# Patient Record
Sex: Female | Born: 1950
Health system: Southern US, Community
[De-identification: ages and names within clinical notes are randomized; demographics above are authoritative.]

## PROBLEM LIST (undated history)

## (undated) DIAGNOSIS — R002 Palpitations: Secondary | ICD-10-CM

## (undated) DIAGNOSIS — Z860101 Personal history of adenomatous and serrated colon polyps: Secondary | ICD-10-CM

## (undated) DIAGNOSIS — I4891 Unspecified atrial fibrillation: Secondary | ICD-10-CM

## (undated) DIAGNOSIS — I4510 Unspecified right bundle-branch block: Secondary | ICD-10-CM

## (undated) DIAGNOSIS — K219 Gastro-esophageal reflux disease without esophagitis: Secondary | ICD-10-CM

## (undated) DIAGNOSIS — R569 Unspecified convulsions: Secondary | ICD-10-CM

## (undated) DIAGNOSIS — R519 Headache, unspecified: Secondary | ICD-10-CM

## (undated) DIAGNOSIS — Z800 Family history of malignant neoplasm of digestive organs: Secondary | ICD-10-CM

## (undated) DIAGNOSIS — R51 Headache: Secondary | ICD-10-CM

## (undated) DIAGNOSIS — H269 Unspecified cataract: Secondary | ICD-10-CM

## (undated) DIAGNOSIS — Z86010 Personal history of colonic polyps: Secondary | ICD-10-CM

## (undated) DIAGNOSIS — G8929 Other chronic pain: Secondary | ICD-10-CM

## (undated) DIAGNOSIS — I10 Essential (primary) hypertension: Secondary | ICD-10-CM

## (undated) DIAGNOSIS — T7840XA Allergy, unspecified, initial encounter: Secondary | ICD-10-CM

## (undated) DIAGNOSIS — Z87898 Personal history of other specified conditions: Secondary | ICD-10-CM

## (undated) DIAGNOSIS — F1910 Other psychoactive substance abuse, uncomplicated: Secondary | ICD-10-CM

## (undated) DIAGNOSIS — C3490 Malignant neoplasm of unspecified part of unspecified bronchus or lung: Secondary | ICD-10-CM

## (undated) DIAGNOSIS — M1990 Unspecified osteoarthritis, unspecified site: Secondary | ICD-10-CM

## (undated) DIAGNOSIS — E079 Disorder of thyroid, unspecified: Secondary | ICD-10-CM

## (undated) DIAGNOSIS — C4490 Unspecified malignant neoplasm of skin, unspecified: Secondary | ICD-10-CM

## (undated) DIAGNOSIS — G709 Myoneural disorder, unspecified: Secondary | ICD-10-CM

## (undated) DIAGNOSIS — E785 Hyperlipidemia, unspecified: Secondary | ICD-10-CM

## (undated) DIAGNOSIS — J439 Emphysema, unspecified: Secondary | ICD-10-CM

## (undated) HISTORY — DX: Personal history of adenomatous and serrated colon polyps: Z86.0101

## (undated) HISTORY — DX: Personal history of colonic polyps: Z86.010

## (undated) HISTORY — DX: Gastro-esophageal reflux disease without esophagitis: K21.9

## (undated) HISTORY — DX: Hyperlipidemia, unspecified: E78.5

## (undated) HISTORY — DX: Other psychoactive substance abuse, uncomplicated: F19.10

## (undated) HISTORY — DX: Unspecified cataract: H26.9

## (undated) HISTORY — DX: Personal history of other specified conditions: Z87.898

## (undated) HISTORY — DX: Disorder of thyroid, unspecified: E07.9

## (undated) HISTORY — DX: Headache, unspecified: R51.9

## (undated) HISTORY — DX: Myoneural disorder, unspecified: G70.9

## (undated) HISTORY — PX: EYE SURGERY: SHX253

## (undated) HISTORY — DX: Unspecified malignant neoplasm of skin, unspecified: C44.90

## (undated) HISTORY — PX: WRIST SURGERY: SHX841

## (undated) HISTORY — DX: Palpitations: R00.2

## (undated) HISTORY — PX: OTHER SURGICAL HISTORY: SHX169

## (undated) HISTORY — DX: Unspecified right bundle-branch block: I45.10

## (undated) HISTORY — DX: Family history of malignant neoplasm of digestive organs: Z80.0

## (undated) HISTORY — PX: SMALL INTESTINE SURGERY: SHX150

## (undated) HISTORY — PX: CATARACT EXTRACTION W/ INTRAOCULAR LENS  IMPLANT, BILATERAL: SHX1307

## (undated) HISTORY — DX: Headache: R51

## (undated) HISTORY — DX: Other chronic pain: G89.29

## (undated) HISTORY — DX: Essential (primary) hypertension: I10

## (undated) HISTORY — DX: Allergy, unspecified, initial encounter: T78.40XA

---

## 1983-06-25 HISTORY — PX: BLADDER SURGERY: SHX569

## 1998-05-10 ENCOUNTER — Other Ambulatory Visit: Admission: RE | Admit: 1998-05-10 | Discharge: 1998-05-10 | Payer: Self-pay | Admitting: Gynecology

## 1999-06-06 ENCOUNTER — Other Ambulatory Visit: Admission: RE | Admit: 1999-06-06 | Discharge: 1999-06-06 | Payer: Self-pay | Admitting: Gynecology

## 1999-06-25 HISTORY — PX: BREAST BIOPSY: SHX20

## 2000-03-31 ENCOUNTER — Other Ambulatory Visit: Admission: RE | Admit: 2000-03-31 | Discharge: 2000-03-31 | Payer: Self-pay | Admitting: Radiology

## 2000-08-13 ENCOUNTER — Other Ambulatory Visit: Admission: RE | Admit: 2000-08-13 | Discharge: 2000-08-13 | Payer: Self-pay | Admitting: Gynecology

## 2000-12-01 ENCOUNTER — Other Ambulatory Visit: Admission: RE | Admit: 2000-12-01 | Discharge: 2000-12-01 | Payer: Self-pay | Admitting: Gynecology

## 2001-05-04 ENCOUNTER — Other Ambulatory Visit: Admission: RE | Admit: 2001-05-04 | Discharge: 2001-05-04 | Payer: Self-pay | Admitting: Gynecology

## 2011-12-10 DIAGNOSIS — H5210 Myopia, unspecified eye: Secondary | ICD-10-CM | POA: Insufficient documentation

## 2011-12-25 DIAGNOSIS — H2510 Age-related nuclear cataract, unspecified eye: Secondary | ICD-10-CM | POA: Insufficient documentation

## 2012-01-24 ENCOUNTER — Telehealth: Payer: Self-pay | Admitting: Internal Medicine

## 2012-01-24 NOTE — Telephone Encounter (Signed)
Georgana Romain has questions about Fran Neiswonger care.  Patient is at [facility].  Non urgent.  Please call back if caller is POA or has been released to share info with at [phone] or [phone]

## 2012-01-27 NOTE — Telephone Encounter (Signed)
Left message on cell phone that we don't have pt's records here, she would need to call [facility] to leave a message for Dr.Letvak there.

## 2013-12-31 LAB — HM COLONOSCOPY

## 2015-06-29 LAB — HM DEXA SCAN

## 2015-07-11 DIAGNOSIS — I4510 Unspecified right bundle-branch block: Secondary | ICD-10-CM | POA: Insufficient documentation

## 2016-02-20 LAB — HM HEPATITIS C SCREENING LAB: HM Hepatitis Screen: NEGATIVE

## 2016-07-23 LAB — HM MAMMOGRAPHY

## 2017-03-25 DIAGNOSIS — R002 Palpitations: Secondary | ICD-10-CM | POA: Insufficient documentation

## 2017-07-25 LAB — HM MAMMOGRAPHY

## 2017-07-28 DIAGNOSIS — M47812 Spondylosis without myelopathy or radiculopathy, cervical region: Secondary | ICD-10-CM | POA: Insufficient documentation

## 2017-08-06 DIAGNOSIS — R911 Solitary pulmonary nodule: Secondary | ICD-10-CM | POA: Insufficient documentation

## 2017-08-06 DIAGNOSIS — C3430 Malignant neoplasm of lower lobe, unspecified bronchus or lung: Secondary | ICD-10-CM | POA: Insufficient documentation

## 2017-08-06 DIAGNOSIS — Z85118 Personal history of other malignant neoplasm of bronchus and lung: Secondary | ICD-10-CM | POA: Insufficient documentation

## 2017-08-11 DIAGNOSIS — R51 Headache: Secondary | ICD-10-CM

## 2017-08-11 DIAGNOSIS — G4486 Cervicogenic headache: Secondary | ICD-10-CM | POA: Insufficient documentation

## 2017-08-11 DIAGNOSIS — M5030 Other cervical disc degeneration, unspecified cervical region: Secondary | ICD-10-CM | POA: Insufficient documentation

## 2017-08-12 DIAGNOSIS — Z961 Presence of intraocular lens: Secondary | ICD-10-CM | POA: Insufficient documentation

## 2017-08-12 DIAGNOSIS — H04129 Dry eye syndrome of unspecified lacrimal gland: Secondary | ICD-10-CM | POA: Insufficient documentation

## 2017-08-12 DIAGNOSIS — H43819 Vitreous degeneration, unspecified eye: Secondary | ICD-10-CM | POA: Insufficient documentation

## 2017-08-19 ENCOUNTER — Encounter: Payer: Self-pay | Admitting: Pulmonary Disease

## 2017-08-19 ENCOUNTER — Ambulatory Visit: Payer: Medicare Other | Admitting: Pulmonary Disease

## 2017-08-19 VITALS — BP 126/82 | HR 90 | Ht 67.0 in | Wt 143.0 lb

## 2017-08-19 DIAGNOSIS — R911 Solitary pulmonary nodule: Secondary | ICD-10-CM

## 2017-08-19 DIAGNOSIS — R918 Other nonspecific abnormal finding of lung field: Secondary | ICD-10-CM | POA: Diagnosis not present

## 2017-08-19 NOTE — Patient Instructions (Addendum)
Lung nodule: Given your smoking history we need to image this again We will perform a PET/CT scan If the findings are suggestive of malignancy then we will recommend a biopsy If the findings do not show evidence of increased metabolic activity then we will monitor with repeat CT scans We will arrange a follow-up appointment for 3 months from now but may see you sooner if necessary depending on the results of the PET/CT

## 2017-08-19 NOTE — Progress Notes (Signed)
Subjective:   PATIENT ID: Margaret Charles GENDER: female DOB: 1951/02/02, MRN: [id number]  Synopsis: Referred in February 2019 for a lung nodule. She is a former smoker who smoked about 1 ppd for 40 and quit in 2010.   HPI  Chief Complaint  Patient presents with  . pulmonary consult    lung nodule from CT scan   She is a pleasant former smoker who was referred to our clinic for evaluation of an abnormal lung cancer screenig CT scan.  She denies dyspnea, or respiratory complaints.  Sh ehas a lot of "post nasal" mucus in the back of her throat that bothers her some.  She says that she will have some cough associated with this.  She exercises regularly with walking and doing water aerobics.  She can walk three miles and she doesn't have to stop and rest.  She has no personal history of malignancy.  Her sister had breast cancer diagnosed a year ago.    She has a history of bladder reconstruction using her ileus years ago.  This lead to a small bowel obstruction a few times, but none in 30 years.    She used to be an alcoholic, quit drinking 38 years ago.  She struggled with her weight many years ago and has had some seizures "when she was losing the weight".  She denies seizures in years though at this point.    Past Medical History:  Diagnosis Date  . Chronic headaches   . Palpitations   . RBBB (right bundle branch block)      Family History  Problem Relation Age of Onset  . Dementia Mother   . Prostate cancer Father   . Breast cancer Sister      Social History   Socioeconomic History  . Marital status: Single    Spouse name: Not on file  . Number of children: Not on file  . Years of education: Not on file  . Highest education level: Not on file  Social Needs  . Financial resource strain: Not on file  . Food insecurity - worry: Not on file  . Food insecurity - inability: Not on file  . Transportation needs - medical: Not on file  . Transportation needs - non-medical:  Not on file  Occupational History  . Not on file  Tobacco Use  . Smoking status: Former Smoker    Packs/day: 1.00    Years: 40.00    Pack years: 40.00    Types: Cigarettes    Last attempt to quit: 12/03/2008    Years since quitting: 8.7  . Smokeless tobacco: Never Used  Substance and Sexual Activity  . Alcohol use: Not on file  . Drug use: Not on file  . Sexual activity: Not on file  Other Topics Concern  . Not on file  Social History Narrative  . Not on file     Allergies  Allergen Reactions  . Codeine Nausea And Vomiting     Outpatient Medications Prior to Visit  Medication Sig Dispense Refill  . IBUPROFEN PO Take 600 mg by mouth daily as needed.    . Multiple Vitamins-Minerals (CENTRUM ADULTS PO) Take 1 tablet by mouth daily.    Marland Kitchen OVER THE COUNTER MEDICATION Chamomile 100mg  vegetarian tablet- 1 tab daily.     No facility-administered medications prior to visit.     Review of Systems  Constitutional: Negative for chills, fever, malaise/fatigue and weight loss.  HENT: Negative for congestion, nosebleeds, sinus  pain and sore throat.   Eyes: Negative for photophobia, pain and discharge.  Respiratory: Negative for cough, hemoptysis, sputum production, shortness of breath and wheezing.   Cardiovascular: Negative for chest pain, palpitations, orthopnea and leg swelling.  Gastrointestinal: Negative for abdominal pain, constipation, diarrhea, nausea and vomiting.  Genitourinary: Negative for dysuria, frequency, hematuria and urgency.  Musculoskeletal: Negative for back pain, joint pain, myalgias and neck pain.  Skin: Negative for itching and rash.  Neurological: Negative for tingling, tremors, sensory change, speech change, focal weakness, seizures, weakness and headaches.  Psychiatric/Behavioral: Negative for memory loss, substance abuse and suicidal ideas. The patient is not nervous/anxious.       Objective:  Physical Exam   Vitals:   08/19/17 1451  BP: 126/82    Pulse: 90  SpO2: 98%  Weight: 143 lb (64.9 kg)  Height: 5\' 7"  (1.702 m)    Gen: well appearing, no acute distress HENT: NCAT, OP clear, neck supple without masses Eyes: PERRL, EOMi Lymph: no cervical lymphadenopathy PULM: CTA B CV: RRR, no mgr, no JVD GI: BS+, soft, nontender, no hsm Derm: no rash or skin breakdown MSK: normal bulk and tone Neuro: A&Ox4, CN II-XII intact, strength 5/5 in all 4 extremities Psyche: normal mood and affect   CBC No results found for: WBC, RBC, HGB, HCT, PLT, MCV, MCH, MCHC, RDW, LYMPHSABS, MONOABS, EOSABS, BASOSABS   Chest imaging: February 2019 CT chest images independently reviewed showing 2 nodules, one small nodule approximately 4 mm in size in the right upper lobe, one larger nodule in the right lower lobe approximately 14 mm in size  PFT:  Labs:  Path:  Echo:  Heart Catheterization:  Records from her visit with her primary care physician in February 2019 reviewed where she was referred for lung cancer screening.       Assessment & Plan:   Nodule of lower lobe of right lung  Discussion: Ms. Koone presents today for evaluation of an abnormal CT scan of her chest which was performed as part of a lung cancer screening program.  She has a 1.4 cm nodule in the right lower lobe.  Given her smoking history this is worrisome.  We talked about different diagnostic options and we think the best approach moving forward is to perform a PET/CT.  Plan: Lung nodule: Given your smoking history we need to image this again We will perform a PET/CT scan If the findings are suggestive of malignancy then we will recommend a biopsy If the findings do not show evidence of increased metabolic activity then we will monitor with repeat CT scans We will arrange a follow-up appointment for 3 months from now but may see you sooner if necessary depending on the results of the PET/CT    Current Outpatient Medications:  .  IBUPROFEN PO, Take 600 mg by  mouth daily as needed., Disp: , Rfl:  .  Multiple Vitamins-Minerals (CENTRUM ADULTS PO), Take 1 tablet by mouth daily., Disp: , Rfl:  .  OVER THE COUNTER MEDICATION, Chamomile 100mg  vegetarian tablet- 1 tab daily., Disp: , Rfl:

## 2017-08-22 HISTORY — PX: OTHER SURGICAL HISTORY: SHX169

## 2017-08-25 ENCOUNTER — Encounter (HOSPITAL_COMMUNITY): Payer: Self-pay | Admitting: Radiology

## 2017-08-25 ENCOUNTER — Encounter (HOSPITAL_COMMUNITY)
Admission: RE | Admit: 2017-08-25 | Discharge: 2017-08-25 | Disposition: A | Discharge status: Home / Self Care | Payer: Medicare Other | Source: Ambulatory Visit | Attending: Pulmonary Disease | Admitting: Pulmonary Disease

## 2017-08-25 DIAGNOSIS — R918 Other nonspecific abnormal finding of lung field: Secondary | ICD-10-CM | POA: Diagnosis present

## 2017-08-25 LAB — GLUCOSE, CAPILLARY: Glucose-Capillary: 92 mg/dL (ref 65–99)

## 2017-08-25 MED ORDER — FLUDEOXYGLUCOSE F - 18 (FDG) INJECTION
7.1000 | Freq: Once | INTRAVENOUS | Status: AC | PRN
  Administered 2017-08-25: 7.1 via INTRAVENOUS

## 2017-08-26 ENCOUNTER — Telehealth: Payer: Self-pay | Admitting: Pulmonary Disease

## 2017-08-26 NOTE — Telephone Encounter (Signed)
MR, due to increasing chances that it is cancer, I would feel more comfortable with you contacting the patient incase she were to have multiple questions or concerns. Please advise on this.

## 2017-08-26 NOTE — Telephone Encounter (Signed)
Gave results - d/w patient . Give her appt to see me on thu. Add on ok. Do pft before she sees me. Let me know

## 2017-08-26 NOTE — Telephone Encounter (Signed)
BQ not back until 3/11, can the DOD read the Pet scan?

## 2017-08-26 NOTE — Telephone Encounter (Signed)
The RLL nodule is PET hot - increaseing chances it is cancer There is a lso a PET hot thyroid nodule - needs biopsy  Please advise  - that in Dr Lake Bells absence I am hapy to see the patient and counsel her about this. I can ssee her this week  - thurs 08/28/17 is lightest for me  - see if you can get a PFT on or before she sees me  Dr. Brand Males, M.D., [facility].C.P Pulmonary and Critical Care Medicine Staff Physician, [place] Director - Interstitial Lung Disease  Program  Pulmonary [place] at [place], [place], [address]  Pager: [phone], If no answer or between  15:00h - 7:00h: call [phone] Telephone: [phone]

## 2017-08-27 NOTE — Telephone Encounter (Signed)
Please work her in for Friday morning. IS this possible? Please check with Raquel Sarna. I can see her  At 8.50am  Dr. Brand Males, M.D., [facility].C.P Pulmonary and Critical Care Medicine Staff Physician, [place] Director - Interstitial Lung Disease  Program  Pulmonary [place] at [place], [place], [address]  Pager: [phone], If no answer or between  15:00h - 7:00h: call [phone] Telephone: [phone]

## 2017-08-27 NOTE — Telephone Encounter (Signed)
Sending to [place] per her request

## 2017-08-27 NOTE — Telephone Encounter (Signed)
Dr. Chase Caller, we are unable to put the patient on RB schedule due to it already being booked. We can work the patient in with you Thursday 3.7.19 with a PFT at 3:00 if you would be willing to see the patient at 4:00. Please advise if you are ok with this so we can look at other options if you are unable to do this. Thanks.

## 2017-08-27 NOTE — Telephone Encounter (Signed)
Called pt to discuss per MR pt to have PFT done followed by OV with him.  Pt scheduled for pft Thurs at 4pm followed by OV with MR at 945.  Pt expressed understanding with all stated. Nothing further needed at this current time.

## 2017-08-28 ENCOUNTER — Other Ambulatory Visit: Payer: Self-pay | Admitting: Internal Medicine

## 2017-08-28 ENCOUNTER — Ambulatory Visit (INDEPENDENT_AMBULATORY_CARE_PROVIDER_SITE_OTHER): Payer: Medicare Other | Admitting: Internal Medicine

## 2017-08-28 DIAGNOSIS — R0602 Shortness of breath: Secondary | ICD-10-CM | POA: Diagnosis not present

## 2017-08-28 LAB — PULMONARY FUNCTION TEST
DL/VA % PRED: 88 %
DL/VA: 4.44 ml/min/mmHg/L
DLCO UNC: 17.97 ml/min/mmHg
DLCO unc % pred: 66 %
FEF 25-75 POST: 1.77 L/s
FEF 25-75 Pre: 1.49 L/sec
FEF2575-%CHANGE-POST: 18 %
FEF2575-%PRED-POST: 83 %
FEF2575-%PRED-PRE: 70 %
FEV1-%CHANGE-POST: 4 %
FEV1-%PRED-PRE: 78 %
FEV1-%Pred-Post: 81 %
FEV1-POST: 2.06 L
FEV1-Pre: 1.98 L
FEV1FVC-%CHANGE-POST: 3 %
FEV1FVC-%PRED-PRE: 99 %
FEV6-%Change-Post: 1 %
FEV6-%Pred-Post: 82 %
FEV6-%Pred-Pre: 81 %
FEV6-POST: 2.63 L
FEV6-Pre: 2.6 L
FEV6FVC-%Change-Post: 0 %
FEV6FVC-%PRED-POST: 104 %
FEV6FVC-%Pred-Pre: 103 %
FVC-%Change-Post: 0 %
FVC-%PRED-PRE: 78 %
FVC-%Pred-Post: 79 %
FVC-POST: 2.63 L
FVC-PRE: 2.61 L
POST FEV1/FVC RATIO: 78 %
PRE FEV1/FVC RATIO: 76 %
PRE FEV6/FVC RATIO: 99 %
Post FEV6/FVC ratio: 100 %

## 2017-08-28 NOTE — Progress Notes (Signed)
PFT done today. 

## 2017-08-29 ENCOUNTER — Encounter: Payer: Self-pay | Admitting: Internal Medicine

## 2017-08-29 ENCOUNTER — Ambulatory Visit: Payer: Medicare Other | Admitting: Internal Medicine

## 2017-08-29 ENCOUNTER — Telehealth: Payer: Self-pay | Admitting: Internal Medicine

## 2017-08-29 VITALS — BP 140/84 | HR 84 | Ht 66.0 in | Wt 147.6 lb

## 2017-08-29 DIAGNOSIS — R911 Solitary pulmonary nodule: Secondary | ICD-10-CM

## 2017-08-29 DIAGNOSIS — E041 Nontoxic single thyroid nodule: Secondary | ICD-10-CM | POA: Diagnosis not present

## 2017-08-29 NOTE — Telephone Encounter (Signed)
   Margaret Charles  Your patient. IWE had to work her in after she was anxious about PET result. The RLL 1.3cm nodule is PET HOT and she has good lung function. Do you want to do biopsy? Or refer her to surgery ? I thought latter but have told her I would need to d/w you  Thanks  Dr. Brand Males, M.D., [facility].C.P Pulmonary and Critical Care Medicine Staff Physician, [place] Director - Interstitial Lung Disease  Program  Pulmonary [place] at [place], [place], [address]  Pager: [phone], If no answer or between  15:00h - 7:00h: call [phone] Telephone: [phone]

## 2017-08-29 NOTE — Patient Instructions (Addendum)
Nodule of lower lobe of right lung - will d/w Dr Lake Bells but I assume that based on good lung function he will likely recommend direct surgical approach with referral to thoracic surgeohn - please take copy of consent form for the [employer] nodule study; someone should call you in the next week or two  Left thyroid nodule - refer endocrinology   Followup  - if no call from our office by 09/04/17 please calls

## 2017-08-29 NOTE — Progress Notes (Signed)
Subjective:     Patient ID: Margaret Charles, female   DOB: 04/25/1951, 67 y.o.   MRN: [id number]  HPI  Synopsis: Referred in February 2019 for a lung nodule. She is a former smoker who smoked about 1 ppd for 40 and quit in 2010.   HPI  Chief Complaint  Patient presents with  . pulmonary consult    lung nodule from CT scan   She is a pleasant former smoker who was referred to our clinic for evaluation of an abnormal lung cancer screenig CT scan.  She denies dyspnea, or respiratory complaints.  Sh ehas a lot of "post nasal" mucus in the back of her throat that bothers her some.  She says that she will have some cough associated with this.  She exercises regularly with walking and doing water aerobics.  She can walk three miles and she doesn't have to stop and rest.  She has no personal history of malignancy.  Her sister had breast cancer diagnosed a year ago.    She has a history of bladder reconstruction using her ileus years ago.  This lead to a small bowel obstruction a few times, but none in 30 years.    She used to be an alcoholic, quit drinking 38 years ago.  She struggled with her weight many years ago and has had some seizures "when she was losing the weight".  She denies seizures in years though at this point.     OV 08/29/2017  Chief Complaint  Patient presents with  . Follow-up    PET scan results, but no symptoms   PCP Margaret Rima, MD   Margaret Charles -is here to discuss her PET scan results which shows right lower lobe PET hot lung nodule..  She had this on August 25, 2017.  Margaret Charles is away on vacation.  And therefore I was asked to give the results to her.  Patient became very anxious on the phone hearing all the results.  Therefore we brought her in to discuss the results.  In the interim we also did pulmonary function test and her FEV1 is greater than 2 L and her DLCO is greater than 60% and she did an excellent walk test in the office today.  She does not have any  respiratory symptoms.  Of note the PET scan also showed a left upper lobe thyroid nodule that is PET hot.  She grew up in the [place] still she was a teenager.  Walking desaturation test on 08/29/2017 185 feet x 3 laps on ROOM AIR:  did walk all 3 laps at good pace and did not desaturate. Rest pulse ox was 100%, final pulse ox was 100%. HR response 85/min at rest to 118/min at peak exertion. Patient Margaret Charles  Did not Desaturate < 88% . Margaret Charles did not  Desaturated </= 3% points. Margaret Charles yes did get tachyardic    Results for Margaret, Charles (MRN [id number]) as of 08/29/2017 10:00  Ref. Range 08/28/2017 15:37  FEV1-Post Latest Units: L 2.06  FEV1-%Pred-Post Latest Units: % 81  FEV1-%Change-Post Latest Units: % 4  Post FEV1/FVC ratio Latest Units: % 78  Results for Margaret, Charles (MRN [id number]) as of 08/29/2017 10:00  Ref. Range 08/28/2017 15:37  DLCO unc Latest Units: ml/min/mmHg 17.97  DLCO unc % pred Latest Units: % 66     has a past medical history of Chronic headaches, Palpitations, and RBBB (right bundle branch block).   reports that she quit smoking about  8 years ago. Her smoking use included cigarettes. She has a 40.00 pack-year smoking history. she has never used smokeless tobacco.  Past Surgical History:  Procedure Laterality Date  . BLADDER SURGERY    . obstructions     x2 bowel obstruction surgeries    Allergies  Allergen Reactions  . Codeine Nausea And Vomiting    Immunization History  Administered Date(s) Administered  . Influenza Whole 03/19/2017  . Pneumococcal Conjugate-13 06/15/2015  . Pneumococcal Polysaccharide-23 03/19/2017    Family History  Problem Relation Age of Onset  . Dementia Mother   . Prostate cancer Father   . Breast cancer Sister      Current Outpatient Medications:  .  Pseudoeph-Doxylamine-DM-APAP (DAYQUIL/NYQUIL COLD/FLU RELIEF PO), Take 1 tablet by mouth 2 (two) times daily. Takes dayquil in the morning, and nightquil at  night one pill, Disp: , Rfl:  .  IBUPROFEN PO, Take 600 mg by mouth daily as needed., Disp: , Rfl:  .  Multiple Vitamins-Minerals (CENTRUM ADULTS PO), Take 1 tablet by mouth daily., Disp: , Rfl:  .  OVER THE COUNTER MEDICATION, Chamomile 100mg  vegetarian tablet- 1 tab daily., Disp: , Rfl:    Review of Systems     Objective:   Physical Exam Vitals:   08/29/17 0950  BP: 140/84  Pulse: 84  SpO2: 99%  Weight: 147 lb 9.6 oz (67 kg)  Height: 5\' 6"  (1.676 m)  Discussion only visit     Assessment:       ICD-10-CM   1. Nodule of lower lobe of right lung R91.1   2. Left thyroid nodule E04.1        Plan:     .  Right lower lobe lung nodule: She is a former smoker age greater than 74, nodule picked up on lung cancer screening program and is PET active.  There is a high pretest probability for non-small cell lung cancer stage I.  However she grew up in [place] through her teen years and therefore there is a small possibility that this could be histoplasma.  We discussed these 2 possibilities.  I suspect she will need direct surgical referral with wedge resection converting to lobectomy given her good lung function.  However I have told her that I need to discuss this with Margaret Charles her primary pulmonologist to see if he is in agreement with the plan now has different ideas to the approach.Also, we briefly discussed [employer] nodule study blood draw; she is interested in it and so gave copy of ICF for her to review  Left thyroid nodule: We will make a referral to the endocrinologist.  I told her that based on my experience that this is unlikely to be related to the lung nodule.   Dr. Brand Males, M.D., [facility].C.P Pulmonary and Critical Care Medicine Staff Physician, [place] Director - Interstitial Lung Disease  Program  Pulmonary [place] at [place], [place], [address]  Pager: [phone], If no answer or between   15:00h - 7:00h: call [phone] Telephone: [phone]

## 2017-09-01 ENCOUNTER — Telehealth: Payer: Self-pay | Admitting: *Deleted

## 2017-09-01 NOTE — Telephone Encounter (Signed)
Oncology Nurse Navigator Documentation  Oncology Nurse Navigator Flowsheets 09/01/2017  Navigator Location [facility]  Referral date to RadOnc/MedOnc 09/01/2017  Navigator Encounter Type Telephone/I received referral on Margaret Charles today. I called her to schedule her to be seen this week. I was unable to reach. I did leave vm message with my name and phone number to call.   Telephone Outgoing Call  Treatment Phase Abnormal Scans  Barriers/Navigation Needs Coordination of Care  Interventions Coordination of Care  Coordination of Care Other  Acuity Level 2  Time Spent with Patient 30

## 2017-09-01 NOTE — Telephone Encounter (Signed)
I appreciate Dr. Chase Caller seeing Margaret Charles.  I called her this morning to let her know that I think that she needs to see thoracic surgery, will place order and notify the thoracic oncology team.

## 2017-09-04 ENCOUNTER — Ambulatory Visit: Payer: Medicare Other | Admitting: Physical Therapy

## 2017-09-04 ENCOUNTER — Encounter: Payer: Self-pay | Admitting: Cardiothoracic Surgery

## 2017-09-04 ENCOUNTER — Other Ambulatory Visit: Payer: Self-pay

## 2017-09-04 ENCOUNTER — Ambulatory Visit: Payer: Medicare Other | Admitting: Cardiothoracic Surgery

## 2017-09-04 VITALS — BP 148/96 | HR 97 | Resp 20 | Ht 66.0 in | Wt 144.0 lb

## 2017-09-04 DIAGNOSIS — R918 Other nonspecific abnormal finding of lung field: Secondary | ICD-10-CM | POA: Diagnosis not present

## 2017-09-04 NOTE — Progress Notes (Signed)
[address]       [place] [address]             [phone]                    Margaret Charles [place] Medical Record #[id number] Date of Birth: June 15, 1951  Referring: Juanito Doom, MD Primary Care: Helane Rima, MD Primary Cardiologist: No primary care provider on file.  Chief Complaint:    Chief Complaint  Patient presents with  . Lung Lesion    Surgical eval, Chest CT 08/05/17, PET Scan 08/25/17, PFT's 08/28/17    History of Present Illness:    Margaret Charles 67 y.o. female is seen in the office  today for evaluation 1.4 cm lung nodule noted on noted on lung cancer screening CT scan done at [facility] imaging.  The patient has a previous history of smoking approximately 40 years she quit 7 years ago.    Patient remains active walking up to 3 miles a day without shortness of breath   Current Activity/ Functional Status:  Patient is independent with mobility/ambulation, transfers, ADL's, IADL's.   Zubrod Score: At the time of surgery this patient's most appropriate activity status/level should be described as: [x]     0    Normal activity, no symptoms []     1    Restricted in physical strenuous activity but ambulatory, able to do out light work []     2    Ambulatory and capable of self care, unable to do work activities, up and about               >50 % of waking hours                              []     3    Only limited self care, in bed greater than 50% of waking hours []     4    Completely disabled, no self care, confined to bed or chair []     5    Moribund   Past Medical History:  Diagnosis Date  . Chronic headaches   . Palpitations   . RBBB (right bundle branch block)     Past Surgical History:  Procedure Laterality Date  . BLADDER SURGERY    . obstructions     x2 bowel obstruction surgeries    Family History  Problem Relation Age of Onset  . Dementia Mother   . Prostate cancer Father   . Breast cancer Sister    Patient is  has no children sister has recently had myocardial infarction and diagnosed with breast cancer second sister has known ascending aortic aneurysm  Social History   Socioeconomic History  . Marital status: Single    Spouse name: Not on file  . Number of children: Not on file  . Years of education: Not on file  . Highest education level: Not on file  Social Needs  . Financial resource strain: Not on file  . Food insecurity - worry: Not on file  . Food insecurity - inability: Not on file  . Transportation needs - medical: Not on file  . Transportation needs - non-medical: Not on file  Occupational History  . Not on file  Tobacco Use  . Smoking status: Former Smoker    Packs/day: 1.00    Years: 40.00    Pack years: 40.00  Types: Cigarettes    Last attempt to quit: 12/03/2008    Years since quitting: 8.7  . Smokeless tobacco: Never Used  Substance and Sexual Activity  . Alcohol use: Not on file  . Drug use: Not on file  . Sexual activity: Not on file  Other Topics Concern  . Not on file  Social History Narrative  . Not on file    Social History   Tobacco Use  Smoking Status Former Smoker  . Packs/day: 1.00  . Years: 40.00  . Pack years: 40.00  . Types: Cigarettes  . Last attempt to quit: 12/03/2008  . Years since quitting: 8.7  Smokeless Tobacco Never Used    Social History   Substance and Sexual Activity  Alcohol Use Not on file     Allergies  Allergen Reactions  . Codeine Nausea And Vomiting    Current Outpatient Medications  Medication Sig Dispense Refill  . IBUPROFEN PO Take 600 mg by mouth daily as needed.    . Multiple Vitamins-Minerals (CENTRUM ADULTS PO) Take 1 tablet by mouth daily.    Marland Kitchen OVER THE COUNTER MEDICATION Chamomile 100mg  vegetarian tablet- 1 tab daily.     No current facility-administered medications for this visit.     Pertinent items are noted in HPI.   Review of Systems:  Review of Systems  Constitutional: Negative.   HENT:  Negative.   Eyes: Negative.   Respiratory: Positive for sputum production. Negative for cough, hemoptysis, shortness of breath and wheezing.   Cardiovascular: Positive for palpitations. Negative for chest pain, orthopnea, claudication, leg swelling and PND.  Gastrointestinal: Negative.   Genitourinary: Negative.   Musculoskeletal: Positive for back pain, joint pain, myalgias and neck pain. Negative for falls.  Skin: Negative.   Neurological: Positive for seizures.  Endo/Heme/Allergies: Negative.   Psychiatric/Behavioral: Negative.     Physical Exam: BP (!) 148/96 (BP Location: Right Arm, Patient Position: Sitting, Cuff Size: Normal)   Pulse 97   Resp 20   Ht 5\' 6"  (1.676 m)   Wt 144 lb (65.3 kg)   SpO2 98% Comment: RA  BMI 23.24 kg/m   PHYSICAL EXAMINATION: General appearance: alert, cooperative and appears stated age Head: Normocephalic, without obvious abnormality, atraumatic Neck: no adenopathy, no carotid bruit, no JVD, supple, symmetrical, trachea midline and thyroid not enlarged, symmetric, no tenderness/mass/nodules Lymph nodes: Cervical, supraclavicular, and axillary nodes normal. Resp: clear to auscultation bilaterally Back: symmetric, no curvature. ROM normal. No CVA tenderness. Cardio: regular rate and rhythm, S1, S2 normal, no murmur, click, rub or gallop GI: soft, non-tender; bowel sounds normal; no masses,  no organomegaly Extremities: extremities normal, atraumatic, no cyanosis or edema and Homans sign is negative, no sign of DVT Neurologic: Grossly normal  Diagnostic Studies & Laboratory data:     Recent Radiology Findings:  IMPRESSION: Suspicious appearing right lower lobe pulmonary nodule with average diameter of 14 mm.  RECOMMENDATIONS: 3 month follow-up with low-dose CT (CPT code [phone], specify low dose protocol) OR further evaluation with PET/CT.  Lung-RADSTM CATEGORY:Category 4A, suspicious    Electronically Signed by: Sol Passer    Result Narrative  CT CHEST - LUNG CANCER SCREENING  COMPARISON:None   INDICATION: Lung cancer annual screening, asymptomatic, smoker hx or current (less than 30 pack-yrs)  Personal history of tobacco use/personal history of nicotine dependence? Yes  TECHNIQUE:Noncontrast screening chest CT was performed. Reduced radiation dose screening protocol was utilized. 3-D maximal intensity projection images were constructed and reviewed, along with axial and 2-dimensional sagittal  reformatted images. The  data was post processed used [place] for enhanced nodule analysis, comparison, and detection.  Please note that the nodule numbering system utilized in this report is based on DynaCAD Lung analysis and may not necessarily be sequential. In cases with numerous small nodules only the largest or most suspicious may be detailed.  FINDINGS:   Lungs and pleura:  Patent central airways. No pleural effusion or pneumothorax. Mild centrilobular emphysema.  Mediastinum/Soft Tissues:  Normal heart size. Mild coronary calcifications. No adenopathy.  Upper Abdomen: Grossly unremarkable allowing for low dose technique.   Bones: No acute or aggressive bony abnormality. Multilevel degenerative changes in the spine.  Nodule assessment:  ---------------------------------------------------------- ID Finding Aug 05, 2017  ========================================================== 1Comment  Segment/LobeRight Lower Lobe   LocationSlice 244 (HTF)  StatusBaseline   TypeSolid  SpiculatedNo   Long Axis 17.6 mm  Short Axis9.6 mm    Average Diameter14 mm  Equivalent Diameter 9.9 mm   Volume513.8 mm3  Mass436.9 mg  Lung-RADS Category4A  ---------------------------------------------------------- 2Comment Probable scar  Segment/LobeRight Upper Lobe   LocationSlice 62 (HTF)   StatusBaseline   TypeSolid  SpiculatedNo   Long Axis 4.4 mm   Short Axis2.9 mm   Average Diameter4 mm   Equivalent Diameter 4.0 mm   Volume33.5 mm3   Mass31.9 mg   Lung-RADS Category2 ----------------------------------------------------------  Other Result Information  Acute Interface, Incoming Rad Results - 08/06/2017  1:37 PM EST CT CHEST - LUNG CANCER SCREENING  COMPARISON:  None   INDICATION: Lung cancer annual screening, asymptomatic, smoker hx or current (less than 30 pack-yrs)  Personal history of tobacco use/personal history of nicotine dependence? Yes  TECHNIQUE:  Noncontrast screening chest CT was performed. Reduced radiation dose screening protocol was utilized. 3-D maximal intensity projection images were constructed and reviewed, along with axial and 2-dimensional sagittal reformatted images. The  data was post processed used [place] for enhanced nodule analysis, comparison, and detection.  Please note that the nodule  numbering system utilized in this report is based on DynaCAD Lung analysis and may not necessarily be sequential. In cases with numerous small nodules only the largest or most suspicious may be detailed.  FINDINGS:   Lungs and pleura:  Patent central airways. No pleural effusion or pneumothorax. Mild centrilobular emphysema.  Mediastinum/Soft Tissues:  Normal heart size. Mild coronary calcifications. No adenopathy.  Upper Abdomen: Grossly unremarkable allowing for low dose technique.   Bones: No acute or aggressive bony abnormality. Multilevel degenerative changes in the spine.  Nodule assessment:  ---------------------------------------------------------- ID   Finding                     Aug 05, 2017              ========================================================== 1    Comment                                                    Segment/Lobe                Right Lower Lobe               Location                    Slice 010 (HTF)  Status                      Baseline                       Type                        Solid                          Spiculated                  No                             Long Axis                   17.6 mm                        Short Axis                  9.6 mm                         Average Diameter            14 mm                          Equivalent Diameter         9.9 mm                         Volume                      513.8 mm3                      Mass                        436.9 mg                                  Lung-RADS Category          4A                        ---------------------------------------------------------- 2    Comment                     Probable scar                  Segment/Lobe                Right Upper Lobe               Location                    Slice 62 (HTF)                 Status                      Baseline  Type                        Solid                           Spiculated                  No                             Long Axis                   4.4 mm                         Short Axis                  2.9 mm                         Average Diameter            4 mm                           Equivalent Diameter         4.0 mm                         Volume                      33.5 mm3                       Mass                        31.9 mg                               Lung-RADS Category          2                         ----------------------------------------------------------    IMPRESSION: Suspicious appearing right lower lobe pulmonary nodule with average diameter of 14 mm.  RECOMMENDATIONS: 3 month follow-up with low-dose CT (CPT code [phone], specify low dose protocol) OR further evaluation with PET/CT.  Lung-RADSTM CATEGORY:  Category 4A, suspicious    Electronically Signed by: Sol Passer          Nm Pet Image Initial (pi) Skull Base To Thigh  Result Date: 08/25/2017 CLINICAL DATA:  Initial treatment strategy for right lung nodule. EXAM: NUCLEAR MEDICINE PET SKULL BASE TO THIGH TECHNIQUE: 7.1 mCi F-18 FDG was injected intravenously. Full-ring PET imaging was performed from the skull base to thigh after the radiotracer. CT data was obtained and used for attenuation correction and anatomic localization. Fasting blood glucose: 92 mg/dl Mediastinal blood pool activity: SUV max 2.3 COMPARISON:  None. FINDINGS: NECK: No hypermetabolic lymph nodes. A hypermetabolic left thyroid lobe nodule is seen which measures approximately 1.3 cm and has SUV max of 6.5. Incidental CT findings:  None. CHEST: 13 mm pulmonary nodule in the posterior right lower lobe has SUV max of 3.4. No other spine suspicious pulmonary nodules are  seen on CT images. No hypermetabolic lymph nodes within the thorax. Incidental CT findings: Mild emphysema. Aortic and coronary artery atherosclerosis. ABDOMEN/PELVIS: No abnormal hypermetabolic activity  within the liver, pancreas, adrenal glands, or spleen. No hypermetabolic lymph nodes in the abdomen or pelvis. Incidental CT findings:  None. SKELETON: No focal hypermetabolic bone lesions to suggest skeletal metastasis. Incidental CT findings:  None. IMPRESSION: 13 mm hypermetabolic right lower lobe pulmonary nodule, highly suspicious for primary bronchogenic carcinoma. No evidence of thoracic nodal or distant metastatic disease. Hypermetabolic 1.3 cm left thyroid lobe nodule. Ultrasound-guided thyroid needle aspiration biopsy is recommended to rule out thyroid carcinoma. Electronically Signed   By: Earle Gell M.D.   On: 08/25/2017 15:57     I have independently reviewed the above radiologic studies.  Recent Lab Findings: No results found for: WBC, HGB, HCT, PLT, GLUCOSE, CHOL, TRIG, HDL, LDLDIRECT, LDLCALC, ALT, AST, NA, K, CL, CREATININE, BUN, CO2, TSH, INR, GLUF, HGBA1C  PFT's- FEV1 1.98 78%  DLCO 17.97 66% Assessment / Plan:   Patient with discovery of superior segment right lower lobe 1.3 cm lung mass highly suspicious for malignancy, if lung cancer would be clinical stage I.  I discussed with the patient proceeding directly with surgical resection.  Her activity level and PFTs are suitable to allow resection.  Will have cardiology see the patient preop although no previous history of myocardial infarction or stent placement the patient has a history of right bundle branch block, PACs.  Noted having palpitations while she was in her office waiting room    Tentatively plan bronchoscopy right video-assisted thoracoscopy lung resection March 26, pending cardiology clearance.   The risks and options of surgical resection discussed with the patient in detail. The goals risks and alternatives of the planned surgical procedure bronchoscopy with right video-assisted thoracoscopy lung resection have been discussed with the patient in detail. The risks of the procedure including death, infection,  stroke, myocardial infarction, bleeding, blood transfusion have all been discussed specifically.  I have quoted [name] a2 % of perioperative mortality and a complication rate as high as 30%. The patient's questions have been answered.Joslynn Jamroz is willing  to proceed with the planned procedure.    [name] MD      [address] ,[place] [address] Office [phone]   Beeper [phone]  09/04/2017 4:25 PM

## 2017-09-05 ENCOUNTER — Ambulatory Visit: Payer: Medicare Other | Admitting: Cardiovascular Disease

## 2017-09-05 ENCOUNTER — Encounter: Payer: Self-pay | Admitting: Cardiovascular Disease

## 2017-09-05 ENCOUNTER — Other Ambulatory Visit: Payer: Self-pay | Admitting: *Deleted

## 2017-09-05 VITALS — BP 142/84 | HR 76 | Ht 67.0 in | Wt 143.8 lb

## 2017-09-05 DIAGNOSIS — I4510 Unspecified right bundle-branch block: Secondary | ICD-10-CM

## 2017-09-05 DIAGNOSIS — I10 Essential (primary) hypertension: Secondary | ICD-10-CM | POA: Diagnosis not present

## 2017-09-05 DIAGNOSIS — R911 Solitary pulmonary nodule: Secondary | ICD-10-CM

## 2017-09-05 NOTE — Progress Notes (Signed)
Cardiology Office Note:    Date:  09/05/2017   ID:  Margaret Charles, DOB 25-Feb-1951, MRN [id number]  PCP:  Helane Rima, MD  Cardiologist:  Montserrath Madding   Referring MD: Helane Rima, MD   Problem list 1.  Palpitations 2.  Lung nodule 3.  Right bundle branch block   Chief Complaint  Patient presents with  . Pre-op Exam    prior to lung surgery     History of Present Illness:    Margaret Charles is a 67 y.o. female with a hx of 1.4 cm lung nodule.  She has a history of right bundle branch block and palpitations.  We are asked to see her for preoperative clearance prior to lung surgery.  She has a RBBB - which she has known about for the past 10 years . She has seen Renaldo in [place]. ( [facility])  Echo at that time showed normal LV function  Was told that she was stable  Developed palpitations a year or so later.   30 day monitr did not show any issues  She had a low dose lung CT scan for screening ( since she was a smoker )  Was found to have a 1.4 cm mass.    deines any chest pain or dyspnea.   Walks 3 miles a day a a good pace .  Able to climb 3 flights of stairs without any problems  Does water aerobics.  BP has been elevated . She eats lots of salt - popcorn every day .  Hx of obesity in her 41s .  Has lost 90 lbs    Past Medical History:  Diagnosis Date  . Chronic headaches   . Palpitations   . RBBB (right bundle branch block)     Past Surgical History:  Procedure Laterality Date  . BLADDER SURGERY    . obstructions     x2 bowel obstruction surgeries    Current Medications: Current Meds  Medication Sig  . IBUPROFEN PO Take 600 mg by mouth daily as needed.  . Multiple Vitamins-Minerals (CENTRUM ADULTS PO) Take 1 tablet by mouth daily.  Marland Kitchen OVER THE COUNTER MEDICATION Chamomile 100mg  vegetarian tablet- 1 tab daily.     Allergies:   Codeine   Social History   Socioeconomic History  . Marital status: Single    Spouse name: None  . Number of  children: None  . Years of education: None  . Highest education level: None  Social Needs  . Financial resource strain: None  . Food insecurity - worry: None  . Food insecurity - inability: None  . Transportation needs - medical: None  . Transportation needs - non-medical: None  Occupational History  . None  Tobacco Use  . Smoking status: Former Smoker    Packs/day: 1.00    Years: 40.00    Pack years: 40.00    Types: Cigarettes    Last attempt to quit: 12/03/2008    Years since quitting: 8.7  . Smokeless tobacco: Never Used  Substance and Sexual Activity  . Alcohol use: None  . Drug use: None  . Sexual activity: None  Other Topics Concern  . None  Social History Narrative  . None     Family History: The patient's family history includes Breast cancer in her sister; Dementia in her mother; Prostate cancer in her father.  ROS:   Please see the history of present illness.     All other systems reviewed and are negative.  EKGs/Labs/Other Studies  Reviewed:    The following studies were reviewed today:   EKG:   NSR at 76.  RBBB .   Recent Labs: No results found for requested labs within last 8760 hours.  Recent Lipid Panel No results found for: CHOL, TRIG, HDL, CHOLHDL, VLDL, LDLCALC, LDLDIRECT  Physical Exam:    VS:  BP (!) 142/84   Pulse 76   Ht 5\' 7"  (1.702 m)   Wt 143 lb 12.8 oz (65.2 kg)   SpO2 98%   BMI 22.52 kg/m     Wt Readings from Last 3 Encounters:  09/05/17 143 lb 12.8 oz (65.2 kg)  09/04/17 144 lb (65.3 kg)  08/29/17 147 lb 9.6 oz (67 kg)     GEN:  Well nourished, well developed in no acute distress HEENT: Normal NECK: No JVD; No carotid bruits LYMPHATICS: No lymphadenopathy CARDIAC: RRR, no murmurs, rubs, gallops RESPIRATORY:  Clear to auscultation without rales, wheezing or rhonchi  ABDOMEN: Soft, non-tender, non-distended MUSCULOSKELETAL:  No edema; No deformity  SKIN: Warm and dry NEUROLOGIC:  Alert and oriented x 3 PSYCHIATRIC:   Normal affect   ASSESSMENT:    1.  RBBB 2. Mild HTN   PLAN:    In order of problems listed above:  1. Pre op visit for Partial pneumonectomy:     She is very stable.   Has a RBBB she has had palpitations in the past which may be just benign premature ventricular contractions.  Her blood pressure is a little bit elevated today.  She admits to eating some extra salt in the form of popcorn on a daily basis.   From a surgical standpoint I think she is at very low risk.  She is agreed to avoid any extra salt for several days prior to her surgery.  2.  Essential hypertension: Her blood pressure is mildly elevated today.  It is 142/84.  She admits to eating a little bit of extra salt on a daily basis.  I considered whether or not to start her on low-dose HCTZ but I think it might be better just to start any new medications now.  She is agreed to hold off on any extra salt for several days prior to surgery.  We can continue to observe her and added medications as needed.   Medication Adjustments/Labs and Tests Ordered: Current medicines are reviewed at length with the patient today.  Concerns regarding medicines are outlined above.  No orders of the defined types were placed in this encounter.  No orders of the defined types were placed in this encounter.   Signed, Mertie Moores, MD  09/05/2017 2:43 PM    [place]

## 2017-09-05 NOTE — Patient Instructions (Signed)
Medication Instructions:  Your physician recommends that you continue on your current medications as directed. Please refer to the Current Medication list given to you today.   Labwork: None Ordered   Testing/Procedures: None Ordered   Follow-Up: Your physician recommends that you schedule a follow-up appointment in: 3 months with Dr. Nahser   If you need a refill on your cardiac medications before your next appointment, please call your pharmacy.   Thank you for choosing [facility]! Jadis Mika, RN [phone]    

## 2017-09-09 ENCOUNTER — Encounter: Payer: Medicare Other | Admitting: *Deleted

## 2017-09-09 DIAGNOSIS — R911 Solitary pulmonary nodule: Secondary | ICD-10-CM

## 2017-09-09 DIAGNOSIS — Z006 Encounter for examination for normal comparison and control in clinical research program: Secondary | ICD-10-CM

## 2017-09-10 NOTE — Pre-Procedure Instructions (Signed)
Margaret Charles  09/10/2017      [id number] - [place] - [address] [place] [address] Phone: [phone] Fax: [phone]    Your procedure is scheduled on March 26  Report to [facility] at 530 A.M.  Call this number if you have problems the morning of surgery:  [phone]   Remember:  Do not eat food or drink liquids after midnight.  Take these medicines the morning of surgery with A SIP OF WATER none  Stop taking aspirin, BC's, Goody's, Herbal medications, Fish Oil, Aleve, Ibuprofen, Advil, Motrin, Vitamins   Do not wear jewelry, make-up or nail polish.  Do not wear lotions, powders, or perfumes, or deodorant.  Do not shave 48 hours prior to surgery.  Men may shave face and neck.  Do not bring valuables to the hospital.  [facility] is not responsible for any belongings or valuables.  Contacts, dentures or bridgework may not be worn into surgery.  Leave your suitcase in the car.  After surgery it may be brought to your room.  For patients admitted to the hospital, discharge time will be determined by your treatment team.  Patients discharged the day of surgery will not be allowed to drive home.   Special instructions:  [facility]- Preparing For Surgery  Before surgery, you can play an important role. Because skin is not sterile, your skin needs to be as free of germs as possible. You can reduce the number of germs on your skin by washing with CHG (chlorahexidine gluconate) Soap before surgery.  CHG is an antiseptic cleaner which kills germs and bonds with the skin to continue killing germs even after washing.  Please do not use if you have an allergy to CHG or antibacterial soaps. If your skin becomes reddened/irritated stop using the CHG.  Do not shave (including legs and underarms) for at least 48 hours prior to first CHG shower. It is OK to shave your face.  Please follow these instructions  carefully.   1. Shower the NIGHT BEFORE SURGERY and the MORNING OF SURGERY with CHG.   2. If you chose to wash your hair, wash your hair first as usual with your normal shampoo.  3. After you shampoo, rinse your hair and body thoroughly to remove the shampoo.  4. Use CHG as you would any other liquid soap. You can apply CHG directly to the skin and wash gently with a scrungie or a clean washcloth.   5. Apply the CHG Soap to your body ONLY FROM THE NECK DOWN.  Do not use on open wounds or open sores. Avoid contact with your eyes, ears, mouth and genitals (private parts). Wash Face and genitals (private parts)  with your normal soap.  6. Wash thoroughly, paying special attention to the area where your surgery will be performed.  7. Thoroughly rinse your body with warm water from the neck down.  8. DO NOT shower/wash with your normal soap after using and rinsing off the CHG Soap.  9. Pat yourself dry with a CLEAN TOWEL.  10. Wear CLEAN PAJAMAS to bed the night before surgery, wear comfortable clothes the morning of surgery  11. Place CLEAN SHEETS on your bed the night of your first shower and DO NOT SLEEP WITH PETS.    Day of Surgery: Do not apply any deodorants/lotions. Please wear clean clothes to the hospital/surgery center.      Please read over the  following fact sheets that you were given. Pain Booklet, Coughing and Deep Breathing, MRSA Information and Surgical Site Infection Prevention

## 2017-09-11 ENCOUNTER — Encounter (HOSPITAL_COMMUNITY): Payer: Self-pay

## 2017-09-11 ENCOUNTER — Other Ambulatory Visit: Payer: Self-pay

## 2017-09-11 ENCOUNTER — Encounter (HOSPITAL_COMMUNITY)
Admission: RE | Admit: 2017-09-11 | Discharge: 2017-09-11 | Disposition: A | Discharge status: Home / Self Care | Payer: Medicare Other | Source: Ambulatory Visit | Attending: Cardiothoracic Surgery | Admitting: Cardiothoracic Surgery

## 2017-09-11 DIAGNOSIS — R002 Palpitations: Secondary | ICD-10-CM | POA: Diagnosis not present

## 2017-09-11 DIAGNOSIS — Z01812 Encounter for preprocedural laboratory examination: Secondary | ICD-10-CM | POA: Insufficient documentation

## 2017-09-11 DIAGNOSIS — R911 Solitary pulmonary nodule: Secondary | ICD-10-CM | POA: Diagnosis not present

## 2017-09-11 DIAGNOSIS — I4510 Unspecified right bundle-branch block: Secondary | ICD-10-CM | POA: Diagnosis not present

## 2017-09-11 DIAGNOSIS — I10 Essential (primary) hypertension: Secondary | ICD-10-CM | POA: Insufficient documentation

## 2017-09-11 DIAGNOSIS — Z87891 Personal history of nicotine dependence: Secondary | ICD-10-CM | POA: Diagnosis not present

## 2017-09-11 HISTORY — DX: Emphysema, unspecified: J43.9

## 2017-09-11 HISTORY — DX: Unspecified osteoarthritis, unspecified site: M19.90

## 2017-09-11 HISTORY — DX: Unspecified convulsions: R56.9

## 2017-09-11 LAB — URINALYSIS, ROUTINE W REFLEX MICROSCOPIC
Bilirubin Urine: NEGATIVE
Glucose, UA: NEGATIVE mg/dL
Hgb urine dipstick: NEGATIVE
Ketones, ur: NEGATIVE mg/dL
Leukocytes, UA: NEGATIVE
Nitrite: NEGATIVE
Protein, ur: NEGATIVE mg/dL
Specific Gravity, Urine: 1.005 (ref 1.005–1.030)
pH: 6 (ref 5.0–8.0)

## 2017-09-11 LAB — COMPREHENSIVE METABOLIC PANEL
ALT: 17 U/L (ref 14–54)
AST: 19 U/L (ref 15–41)
Albumin: 3.8 g/dL (ref 3.5–5.0)
Alkaline Phosphatase: 89 U/L (ref 38–126)
Anion gap: 10 (ref 5–15)
BUN: 9 mg/dL (ref 6–20)
CO2: 20 mmol/L — ABNORMAL LOW (ref 22–32)
Calcium: 9.5 mg/dL (ref 8.9–10.3)
Chloride: 105 mmol/L (ref 101–111)
Creatinine, Ser: 0.58 mg/dL (ref 0.44–1.00)
GFR calc Af Amer: >60 mL/min (ref >60)
GFR calc non Af Amer: >60 mL/min (ref >60)
Glucose, Bld: 90 mg/dL (ref 65–99)
Potassium: 4.3 mmol/L (ref 3.5–5.1)
Sodium: 135 mmol/L (ref 135–145)
Total Bilirubin: 0.6 mg/dL (ref 0.3–1.2)
Total Protein: 6.6 g/dL (ref 6.5–8.1)

## 2017-09-11 LAB — BLOOD GAS, ARTERIAL
Acid-Base Excess: 0.5 mmol/L (ref 0.0–2.0)
Bicarbonate: 23.9 mmol/L (ref 20.0–28.0)
Drawn by: 470591
FIO2: 21
O2 Saturation: 97.7 %
Patient temperature: 98.6
pCO2 arterial: 34.2 mmHg (ref 32.0–48.0)
pH, Arterial: 7.459 — ABNORMAL HIGH (ref 7.350–7.450)
pO2, Arterial: 93 mmHg (ref 83.0–108.0)

## 2017-09-11 LAB — CBC
HCT: 37.1 % (ref 36.0–46.0)
Hemoglobin: 12.5 g/dL (ref 12.0–15.0)
MCH: 30.3 pg (ref 26.0–34.0)
MCHC: 33.7 g/dL (ref 30.0–36.0)
MCV: 90 fL (ref 78.0–100.0)
Platelets: 309 10*3/uL (ref 150–400)
RBC: 4.12 MIL/uL (ref 3.87–5.11)
RDW: 13.7 % (ref 11.5–15.5)
WBC: 8.2 10*3/uL (ref 4.0–10.5)

## 2017-09-11 LAB — PROTIME-INR
INR: 0.97
Prothrombin Time: 12.8 seconds (ref 11.4–15.2)

## 2017-09-11 LAB — SURGICAL PCR SCREEN
MRSA, PCR: NEGATIVE
Staphylococcus aureus: NEGATIVE

## 2017-09-11 LAB — TYPE AND SCREEN
ABO/RH(D): O POS
Antibody Screen: NEGATIVE

## 2017-09-11 LAB — APTT: aPTT: 40 seconds — ABNORMAL HIGH (ref 24–36)

## 2017-09-11 LAB — ABO/RH: ABO/RH(D): O POS

## 2017-09-12 ENCOUNTER — Other Ambulatory Visit (HOSPITAL_COMMUNITY): Payer: Medicare Other

## 2017-09-12 NOTE — Progress Notes (Addendum)
Anesthesia Chart Review: Patient is a 67 year old female scheduled for video bronchoscopy, VATS, right lung resection on 09/16/17 by Dr. Lanelle Bal.  History includes former smoker (quit '10), chronic headaches, palpitations, right BBB, emphysema, skin cancer (BCC), arthritis, seizure-like activity (possible vagal response) '80's, bladder reconstruction, colon surgery.   - PCP is Dr. Helane Rima. - Cardiologist is Dr. Mertie Moores. She was seen on 09/05/17 for a preoperative evaluation. From a surgical standpoint I think she is at very low risk.  She is agreed to avoid any extra salt for several days prior to her surgery. (She has seen by [facility] in the past by Dr. Lamar Blinks on 07/11/15 for ectopic atrial rhythm with RBBB and Dr. Cherlynn Polo 03/2017 for palpitations.)  - Pulmonologist is Dr. Brand Males.   Meds include chamomile, MVI.  BP (!) 150/81   Pulse 86   Temp 36.8 C   Resp 20   Ht 5\' 7"  (1.702 m)   Wt 143 lb 4.8 oz (65 kg)   SpO2 100%   BMI 22.44 kg/m   EKG 09/05/17: NSR, right BBB.  14 day Cardiac Event Monitor 04/09/17 ([place]): Event Monitor Interpretation: Length of time:14 days Average rate:78 Diary: 2 triggered events were submitted; no symptoms provided.These corresponded to normal tracings with occasional PAC. Ventricular arrhythmia:None Atrial Arrhythmia:PACs Pauses:None Summary: -No sustained arrhythmias identified. -2 triggered events noted that corresponded to normal tracings with occasional premature atrial contractions.  Echo 07/31/15 [facility]): Summary: Left ventricle is normal in size.  There is mild concentric LVH with normal wall motion and ejection fraction 55-60%.  Left ventricular diastolic function is normal.  There is mild (1+) tricuspid regurgitation.  Right ventricular systolic pressure is normal.  The aortic valve is trileaflet.  There is mild aortic valve thickening with trace  regurgitation.  Nuclear exercise stress test 11/25/08 [facility]): Summary: Normal. EF > 65%.   PFTs 08/28/17: FVC 2.61 (78%), FEV1 1.98 (78%), DLCO unc 17.97 (66%).  PET scan 08/25/17: IMPRESSION: - 13 mm hypermetabolic right lower lobe pulmonary nodule, highly suspicious for primary bronchogenic carcinoma. - No evidence of thoracic nodal or distant metastatic disease. - Hypermetabolic 1.3 cm left thyroid lobe nodule. Ultrasound-guided thyroid needle aspiration biopsy is recommended to rule out thyroid carcinoma. (Dr. Chase Caller is referring patient to endocrinology.)  CT Chest 08/06/17 ([place]): IMPRESSION: Suspicious appearing right lower lobe pulmonary nodule with average diameter of 14 mm.  She is for a CXR on the day of surgery.   Preoperative labs noted. Cr 0.58. CBC WNL. PT 12.8/0.97, PTT 40.   Based on currently available information I anticipate that she can proceed as planned if no acute changes.   [facility] Short Stay Center/Anesthesiology Phone [phone] 09/12/2017 3:46 PM

## 2017-09-13 ENCOUNTER — Encounter: Payer: Self-pay | Admitting: Pulmonary Disease

## 2017-09-16 ENCOUNTER — Encounter (HOSPITAL_COMMUNITY): Payer: Self-pay | Admitting: Certified Registered Nurse Anesthetist

## 2017-09-16 ENCOUNTER — Inpatient Hospital Stay (HOSPITAL_COMMUNITY): Payer: Medicare Other

## 2017-09-16 ENCOUNTER — Inpatient Hospital Stay (HOSPITAL_COMMUNITY): Payer: Medicare Other | Admitting: Vascular Surgery

## 2017-09-16 ENCOUNTER — Encounter (HOSPITAL_COMMUNITY)
Admission: RE | Disposition: A | Discharge status: Home / Self Care | Payer: Self-pay | Source: Ambulatory Visit | Attending: Cardiothoracic Surgery

## 2017-09-16 ENCOUNTER — Inpatient Hospital Stay (HOSPITAL_COMMUNITY)
Admission: RE | Admit: 2017-09-16 | Discharge: 2017-09-22 | DRG: 164 | Disposition: A | Discharge status: Home / Self Care | Payer: Medicare Other | Source: Ambulatory Visit | Attending: Cardiothoracic Surgery | Admitting: Cardiothoracic Surgery

## 2017-09-16 DIAGNOSIS — Z9889 Other specified postprocedural states: Secondary | ICD-10-CM

## 2017-09-16 DIAGNOSIS — R112 Nausea with vomiting, unspecified: Secondary | ICD-10-CM | POA: Diagnosis not present

## 2017-09-16 DIAGNOSIS — C3491 Malignant neoplasm of unspecified part of right bronchus or lung: Secondary | ICD-10-CM | POA: Diagnosis present

## 2017-09-16 DIAGNOSIS — I480 Paroxysmal atrial fibrillation: Secondary | ICD-10-CM | POA: Diagnosis present

## 2017-09-16 DIAGNOSIS — I4510 Unspecified right bundle-branch block: Secondary | ICD-10-CM | POA: Diagnosis present

## 2017-09-16 DIAGNOSIS — R911 Solitary pulmonary nodule: Secondary | ICD-10-CM

## 2017-09-16 DIAGNOSIS — Z9689 Presence of other specified functional implants: Secondary | ICD-10-CM

## 2017-09-16 DIAGNOSIS — J982 Interstitial emphysema: Secondary | ICD-10-CM | POA: Diagnosis present

## 2017-09-16 DIAGNOSIS — Z85828 Personal history of other malignant neoplasm of skin: Secondary | ICD-10-CM | POA: Diagnosis not present

## 2017-09-16 DIAGNOSIS — J939 Pneumothorax, unspecified: Secondary | ICD-10-CM | POA: Diagnosis not present

## 2017-09-16 DIAGNOSIS — J9382 Other air leak: Secondary | ICD-10-CM | POA: Diagnosis not present

## 2017-09-16 DIAGNOSIS — K219 Gastro-esophageal reflux disease without esophagitis: Secondary | ICD-10-CM | POA: Diagnosis present

## 2017-09-16 DIAGNOSIS — I10 Essential (primary) hypertension: Secondary | ICD-10-CM | POA: Diagnosis present

## 2017-09-16 DIAGNOSIS — Z87891 Personal history of nicotine dependence: Secondary | ICD-10-CM | POA: Diagnosis not present

## 2017-09-16 DIAGNOSIS — R222 Localized swelling, mass and lump, trunk: Secondary | ICD-10-CM

## 2017-09-16 DIAGNOSIS — Z902 Acquired absence of lung [part of]: Secondary | ICD-10-CM

## 2017-09-16 DIAGNOSIS — Z4682 Encounter for fitting and adjustment of non-vascular catheter: Secondary | ICD-10-CM

## 2017-09-16 HISTORY — PX: VIDEO ASSISTED THORACOSCOPY (VATS)/WEDGE RESECTION: SHX6174

## 2017-09-16 HISTORY — PX: VIDEO BRONCHOSCOPY: SHX5072

## 2017-09-16 SURGERY — BRONCHOSCOPY, VIDEO-ASSISTED
Anesthesia: General | Site: Chest | Laterality: Right

## 2017-09-16 MED ORDER — CEFAZOLIN SODIUM-DEXTROSE 2-4 GM/100ML-% IV SOLN
2.0000 g | Freq: Three times a day (TID) | INTRAVENOUS | Status: AC
  Administered 2017-09-16 (×2): 2 g via INTRAVENOUS
  Filled 2017-09-16 (×2): qty 100

## 2017-09-16 MED ORDER — PROPOFOL 10 MG/ML IV BOLUS
INTRAVENOUS | Status: DC | PRN
  Administered 2017-09-16: 100 mg via INTRAVENOUS
  Administered 2017-09-16: 30 mg via INTRAVENOUS
  Administered 2017-09-16: 20 mg via INTRAVENOUS

## 2017-09-16 MED ORDER — ROCURONIUM BROMIDE 100 MG/10ML IV SOLN
INTRAVENOUS | Status: DC | PRN
  Administered 2017-09-16: 30 mg via INTRAVENOUS
  Administered 2017-09-16: 50 mg via INTRAVENOUS
  Administered 2017-09-16: 20 mg via INTRAVENOUS

## 2017-09-16 MED ORDER — GLYCOPYRROLATE 0.2 MG/ML IJ SOLN
INTRAMUSCULAR | Status: DC | PRN
  Administered 2017-09-16: 0.4 mg via INTRAVENOUS

## 2017-09-16 MED ORDER — ONDANSETRON HCL 4 MG/2ML IJ SOLN
INTRAMUSCULAR | Status: AC
  Filled 2017-09-16: qty 2

## 2017-09-16 MED ORDER — OXYCODONE HCL 5 MG PO TABS
5.0000 mg | ORAL_TABLET | ORAL | Status: DC | PRN
  Administered 2017-09-16 – 2017-09-21 (×4): 10 mg via ORAL
  Filled 2017-09-16 (×4): qty 2

## 2017-09-16 MED ORDER — ACETAMINOPHEN 500 MG PO TABS
1000.0000 mg | ORAL_TABLET | Freq: Four times a day (QID) | ORAL | Status: AC
  Administered 2017-09-16 – 2017-09-21 (×17): 1000 mg via ORAL
  Filled 2017-09-16 (×17): qty 2

## 2017-09-16 MED ORDER — LIDOCAINE HCL (CARDIAC) 20 MG/ML IV SOLN
INTRAVENOUS | Status: DC | PRN
  Administered 2017-09-16: 100 mg via INTRAVENOUS

## 2017-09-16 MED ORDER — BUPIVACAINE 0.5 % ON-Q PUMP SINGLE CATH 400 ML
INJECTION | Status: AC | PRN
  Administered 2017-09-16: 400 mL

## 2017-09-16 MED ORDER — ONDANSETRON HCL 4 MG/2ML IJ SOLN
4.0000 mg | Freq: Four times a day (QID) | INTRAMUSCULAR | Status: DC | PRN
  Administered 2017-09-18 – 2017-09-20 (×4): 4 mg via INTRAVENOUS
  Filled 2017-09-16 (×5): qty 2

## 2017-09-16 MED ORDER — SODIUM CHLORIDE 0.9 % IJ SOLN
INTRAMUSCULAR | Status: AC
  Filled 2017-09-16: qty 10

## 2017-09-16 MED ORDER — DEXTROSE-NACL 5-0.45 % IV SOLN
INTRAVENOUS | Status: DC
  Administered 2017-09-16 – 2017-09-17 (×3): via INTRAVENOUS

## 2017-09-16 MED ORDER — BUPIVACAINE 0.5 % ON-Q PUMP SINGLE CATH 400 ML
400.0000 mL | INJECTION | Status: DC
  Filled 2017-09-16: qty 400

## 2017-09-16 MED ORDER — MIDAZOLAM HCL 2 MG/2ML IJ SOLN
INTRAMUSCULAR | Status: AC
  Filled 2017-09-16: qty 2

## 2017-09-16 MED ORDER — SENNOSIDES-DOCUSATE SODIUM 8.6-50 MG PO TABS
1.0000 | ORAL_TABLET | Freq: Every day | ORAL | Status: DC
  Administered 2017-09-16 – 2017-09-18 (×3): 1 via ORAL
  Filled 2017-09-16 (×3): qty 1

## 2017-09-16 MED ORDER — DEXAMETHASONE SODIUM PHOSPHATE 10 MG/ML IJ SOLN
INTRAMUSCULAR | Status: AC
  Filled 2017-09-16: qty 1

## 2017-09-16 MED ORDER — HYDROMORPHONE HCL 1 MG/ML IJ SOLN
INTRAMUSCULAR | Status: AC
  Filled 2017-09-16: qty 1

## 2017-09-16 MED ORDER — LACTATED RINGERS IV SOLN
INTRAVENOUS | Status: DC | PRN
  Administered 2017-09-16: 08:00:00 via INTRAVENOUS

## 2017-09-16 MED ORDER — FENTANYL CITRATE (PF) 250 MCG/5ML IJ SOLN
INTRAMUSCULAR | Status: AC
  Filled 2017-09-16: qty 10

## 2017-09-16 MED ORDER — BUPIVACAINE HCL (PF) 0.5 % IJ SOLN
INTRAMUSCULAR | Status: AC
  Filled 2017-09-16: qty 10

## 2017-09-16 MED ORDER — FENTANYL 40 MCG/ML IV SOLN
INTRAVENOUS | Status: DC
  Administered 2017-09-16: 180 ug via INTRAVENOUS
  Administered 2017-09-16: 120 ug via INTRAVENOUS
  Administered 2017-09-16: 160 ug via INTRAVENOUS
  Administered 2017-09-16: 1000 ug via INTRAVENOUS
  Administered 2017-09-17: 290 ug via INTRAVENOUS
  Administered 2017-09-17: 190 ug via INTRAVENOUS
  Administered 2017-09-17: 80 ug via INTRAVENOUS
  Administered 2017-09-17: 140 ug via INTRAVENOUS
  Administered 2017-09-17: 130 ug via INTRAVENOUS
  Administered 2017-09-17: 80 ug via INTRAVENOUS
  Administered 2017-09-18: 70 ug via INTRAVENOUS
  Administered 2017-09-19: 0 ug via INTRAVENOUS
  Filled 2017-09-16 (×2): qty 25

## 2017-09-16 MED ORDER — NALOXONE HCL 0.4 MG/ML IJ SOLN: 0.4000 mg | INTRAMUSCULAR | Status: DC | PRN

## 2017-09-16 MED ORDER — MIDAZOLAM HCL 5 MG/5ML IJ SOLN
INTRAMUSCULAR | Status: DC | PRN
  Administered 2017-09-16: 2 mg via INTRAVENOUS

## 2017-09-16 MED ORDER — PROPOFOL 10 MG/ML IV BOLUS
INTRAVENOUS | Status: AC
  Filled 2017-09-16: qty 40

## 2017-09-16 MED ORDER — MEPERIDINE HCL 50 MG/ML IJ SOLN: 6.2500 mg | INTRAMUSCULAR | Status: DC | PRN

## 2017-09-16 MED ORDER — CEFAZOLIN SODIUM-DEXTROSE 2-4 GM/100ML-% IV SOLN
2.0000 g | INTRAVENOUS | Status: AC
  Administered 2017-09-16: 2 g via INTRAVENOUS
  Filled 2017-09-16: qty 100

## 2017-09-16 MED ORDER — EPHEDRINE SULFATE-NACL 50-0.9 MG/10ML-% IV SOSY
PREFILLED_SYRINGE | INTRAVENOUS | Status: DC | PRN
  Administered 2017-09-16: 5 mg via INTRAVENOUS
  Administered 2017-09-16: 10 mg via INTRAVENOUS

## 2017-09-16 MED ORDER — FENTANYL CITRATE (PF) 100 MCG/2ML IJ SOLN
INTRAMUSCULAR | Status: DC | PRN
  Administered 2017-09-16: 50 ug via INTRAVENOUS
  Administered 2017-09-16: 25 ug via INTRAVENOUS
  Administered 2017-09-16: 50 ug via INTRAVENOUS
  Administered 2017-09-16: 100 ug via INTRAVENOUS
  Administered 2017-09-16: 25 ug via INTRAVENOUS
  Administered 2017-09-16 (×5): 50 ug via INTRAVENOUS

## 2017-09-16 MED ORDER — POTASSIUM CHLORIDE 10 MEQ/50ML IV SOLN
10.0000 meq | Freq: Every day | INTRAVENOUS | Status: DC | PRN
  Administered 2017-09-17 (×3): 10 meq via INTRAVENOUS
  Filled 2017-09-16 (×4): qty 50

## 2017-09-16 MED ORDER — ONDANSETRON HCL 4 MG/2ML IJ SOLN: 4.0000 mg | Freq: Once | INTRAMUSCULAR | Status: DC | PRN

## 2017-09-16 MED ORDER — BISACODYL 5 MG PO TBEC
10.0000 mg | DELAYED_RELEASE_TABLET | Freq: Every day | ORAL | Status: DC
  Administered 2017-09-18 – 2017-09-20 (×3): 10 mg via ORAL
  Filled 2017-09-16 (×6): qty 2

## 2017-09-16 MED ORDER — BUPIVACAINE HCL 0.5 % IJ SOLN
INTRAMUSCULAR | Status: DC | PRN
  Administered 2017-09-16: 10 mL

## 2017-09-16 MED ORDER — HEMOSTATIC AGENTS (NO CHARGE) OPTIME
TOPICAL | Status: DC | PRN
  Administered 2017-09-16: 1 via TOPICAL

## 2017-09-16 MED ORDER — LACTATED RINGERS IV SOLN
INTRAVENOUS | Status: DC | PRN
  Administered 2017-09-16: 07:00:00 via INTRAVENOUS

## 2017-09-16 MED ORDER — DEXAMETHASONE SODIUM PHOSPHATE 10 MG/ML IJ SOLN
INTRAMUSCULAR | Status: DC | PRN
  Administered 2017-09-16: 10 mg via INTRAVENOUS

## 2017-09-16 MED ORDER — HYDROMORPHONE HCL 1 MG/ML IJ SOLN
0.2500 mg | INTRAMUSCULAR | Status: DC | PRN
  Administered 2017-09-16 (×4): 0.5 mg via INTRAVENOUS

## 2017-09-16 MED ORDER — METOCLOPRAMIDE HCL 5 MG/ML IJ SOLN
10.0000 mg | Freq: Four times a day (QID) | INTRAMUSCULAR | Status: AC
Start: 2017-09-16 — End: 2017-09-17
  Administered 2017-09-16 – 2017-09-17 (×3): 10 mg via INTRAVENOUS
  Filled 2017-09-16 (×3): qty 2

## 2017-09-16 MED ORDER — 0.9 % SODIUM CHLORIDE (POUR BTL) OPTIME
TOPICAL | Status: DC | PRN
  Administered 2017-09-16: 2000 mL

## 2017-09-16 MED ORDER — ACETAMINOPHEN 160 MG/5ML PO SOLN: 1000.0000 mg | Freq: Four times a day (QID) | ORAL | Status: AC

## 2017-09-16 MED ORDER — NEOSTIGMINE METHYLSULFATE 10 MG/10ML IV SOLN
INTRAVENOUS | Status: DC | PRN
  Administered 2017-09-16: 3 mg via INTRAVENOUS

## 2017-09-16 MED ORDER — LIDOCAINE HCL (CARDIAC) 20 MG/ML IV SOLN
INTRAVENOUS | Status: AC
  Filled 2017-09-16: qty 5

## 2017-09-16 MED ORDER — ROCURONIUM BROMIDE 10 MG/ML (PF) SYRINGE
PREFILLED_SYRINGE | INTRAVENOUS | Status: AC
  Filled 2017-09-16: qty 5

## 2017-09-16 MED ORDER — EPHEDRINE 5 MG/ML INJ
INTRAVENOUS | Status: AC
  Filled 2017-09-16: qty 10

## 2017-09-16 MED ORDER — ENOXAPARIN SODIUM 40 MG/0.4ML ~~LOC~~ SOLN
40.0000 mg | SUBCUTANEOUS | Status: DC
  Administered 2017-09-16 – 2017-09-21 (×7): 40 mg via SUBCUTANEOUS
  Filled 2017-09-16 (×6): qty 0.4

## 2017-09-16 MED ORDER — ONDANSETRON HCL 4 MG/2ML IJ SOLN
4.0000 mg | Freq: Four times a day (QID) | INTRAMUSCULAR | Status: DC | PRN
  Administered 2017-09-16 – 2017-09-17 (×3): 4 mg via INTRAVENOUS
  Filled 2017-09-16 (×2): qty 2

## 2017-09-16 MED ORDER — TRAMADOL HCL 50 MG PO TABS
50.0000 mg | ORAL_TABLET | Freq: Four times a day (QID) | ORAL | Status: DC | PRN
  Administered 2017-09-19: 100 mg via ORAL
  Filled 2017-09-16: qty 2

## 2017-09-16 MED ORDER — NEOSTIGMINE METHYLSULFATE 5 MG/5ML IV SOSY
PREFILLED_SYRINGE | INTRAVENOUS | Status: AC
  Filled 2017-09-16: qty 5

## 2017-09-16 MED ORDER — LEVALBUTEROL HCL 0.63 MG/3ML IN NEBU
0.6300 mg | INHALATION_SOLUTION | Freq: Four times a day (QID) | RESPIRATORY_TRACT | Status: DC
  Administered 2017-09-16 – 2017-09-18 (×8): 0.63 mg via RESPIRATORY_TRACT
  Filled 2017-09-16 (×9): qty 3

## 2017-09-16 MED ORDER — DIPHENHYDRAMINE HCL 12.5 MG/5ML PO ELIX
12.5000 mg | ORAL_SOLUTION | Freq: Four times a day (QID) | ORAL | Status: DC | PRN
  Filled 2017-09-16: qty 5

## 2017-09-16 MED ORDER — ONDANSETRON HCL 4 MG/2ML IJ SOLN
INTRAMUSCULAR | Status: DC | PRN
  Administered 2017-09-16: 4 mg via INTRAVENOUS

## 2017-09-16 MED ORDER — HYDROMORPHONE HCL 1 MG/ML IJ SOLN
INTRAMUSCULAR | Status: AC
  Filled 2017-09-16: qty 2

## 2017-09-16 MED ORDER — SODIUM CHLORIDE 0.9% FLUSH: 9.0000 mL | INTRAVENOUS | Status: DC | PRN

## 2017-09-16 MED ORDER — DIPHENHYDRAMINE HCL 50 MG/ML IJ SOLN: 12.5000 mg | Freq: Four times a day (QID) | INTRAMUSCULAR | Status: DC | PRN

## 2017-09-16 MED ORDER — OXYCODONE HCL 5 MG PO TABS
ORAL_TABLET | ORAL | Status: AC
  Filled 2017-09-16: qty 2

## 2017-09-16 SURGICAL SUPPLY — 93 items
ADH SKN CLS APL DERMABOND .7 (GAUZE/BANDAGES/DRESSINGS) ×2
APL SRG 22X2 LUM MLBL SLNT (VASCULAR PRODUCTS)
APL SRG 7X2 LUM MLBL SLNT (VASCULAR PRODUCTS)
APPLICATOR TIP COSEAL (VASCULAR PRODUCTS) IMPLANT
APPLICATOR TIP EXT COSEAL (VASCULAR PRODUCTS) IMPLANT
BLADE SURG 11 STRL SS (BLADE) ×2 IMPLANT
CANISTER SUCT 3000ML PPV (MISCELLANEOUS) ×3 IMPLANT
CATH KIT ON Q 5IN SLV (PAIN MANAGEMENT) ×1 IMPLANT
CATH THORACIC 28FR (CATHETERS) ×1 IMPLANT
CATH THORACIC 36FR (CATHETERS) IMPLANT
CATH THORACIC 36FR RT ANG (CATHETERS) IMPLANT
CLIP VESOCCLUDE MED 6/CT (CLIP) ×3 IMPLANT
CONN ST 1/4X3/8  BEN (MISCELLANEOUS)
CONN ST 1/4X3/8 BEN (MISCELLANEOUS) IMPLANT
CONT SPEC 4OZ CLIKSEAL STRL BL (MISCELLANEOUS) ×14 IMPLANT
COVER BACK TABLE 60X90IN (DRAPES) ×3 IMPLANT
CUTTER ECHEON FLEX ENDO 45 340 (ENDOMECHANICALS) ×6 IMPLANT
DECANTER SPIKE VIAL GLASS SM (MISCELLANEOUS) ×1 IMPLANT
DERMABOND ADVANCED (GAUZE/BANDAGES/DRESSINGS) ×1
DERMABOND ADVANCED .7 DNX12 (GAUZE/BANDAGES/DRESSINGS) IMPLANT
DRAIN CHANNEL 28F RND 3/8 FF (WOUND CARE) ×1 IMPLANT
DRAIN CHANNEL 32F RND 10.7 FF (WOUND CARE) IMPLANT
DRAPE LAPAROSCOPIC ABDOMINAL (DRAPES) ×3 IMPLANT
DRILL BIT 7/64X5 (BIT) IMPLANT
ELECT BLADE 4.0 EZ CLEAN MEGAD (MISCELLANEOUS) ×3
ELECT BLADE 6.5 EXT (BLADE) ×3 IMPLANT
ELECT REM PT RETURN 9FT ADLT (ELECTROSURGICAL) ×3
ELECTRODE BLDE 4.0 EZ CLN MEGD (MISCELLANEOUS) ×2 IMPLANT
ELECTRODE REM PT RTRN 9FT ADLT (ELECTROSURGICAL) ×2 IMPLANT
GAUZE SPONGE 4X4 12PLY STRL LF (GAUZE/BANDAGES/DRESSINGS) ×2 IMPLANT
GLOVE BIO SURGEON STRL SZ 6 (GLOVE) ×2 IMPLANT
GLOVE BIO SURGEON STRL SZ 6.5 (GLOVE) ×7 IMPLANT
GLOVE BIOGEL PI IND STRL 6.5 (GLOVE) IMPLANT
GLOVE BIOGEL PI INDICATOR 6.5 (GLOVE) ×4
GOWN STRL REUS W/ TWL LRG LVL3 (GOWN DISPOSABLE) ×8 IMPLANT
GOWN STRL REUS W/TWL LRG LVL3 (GOWN DISPOSABLE) ×12
KIT BASIN OR (CUSTOM PROCEDURE TRAY) ×3 IMPLANT
KIT ROOM TURNOVER OR (KITS) ×3 IMPLANT
KIT SUCTION CATH 14FR (SUCTIONS) ×3 IMPLANT
NS IRRIG 1000ML POUR BTL (IV SOLUTION) ×6 IMPLANT
PACK CHEST (CUSTOM PROCEDURE TRAY) ×3 IMPLANT
PAD ARMBOARD 7.5X6 YLW CONV (MISCELLANEOUS) ×9 IMPLANT
PASSER SUT SWANSON 36MM LOOP (INSTRUMENTS) ×2 IMPLANT
POUCH ENDO CATCH II 15MM (MISCELLANEOUS) ×1 IMPLANT
RELOAD STAPLE 35X2.5 WHT THIN (STAPLE) IMPLANT
RELOAD STAPLE 45 4.1 GRN THCK (STAPLE) IMPLANT
RELOAD STAPLE 45 GOLD REG/THCK (STAPLE) IMPLANT
SCISSORS LAP 5X35 DISP (ENDOMECHANICALS) IMPLANT
SEALANT PROGEL (MISCELLANEOUS) ×1 IMPLANT
SEALANT SURG COSEAL 4ML (VASCULAR PRODUCTS) IMPLANT
SEALANT SURG COSEAL 8ML (VASCULAR PRODUCTS) IMPLANT
SOLUTION ANTI FOG 6CC (MISCELLANEOUS) ×3 IMPLANT
STAPLE RELOAD 2.5MM WHITE (STAPLE) ×9 IMPLANT
STAPLE RELOAD 45 GRN (STAPLE) ×2 IMPLANT
STAPLE RELOAD 45MM GOLD (STAPLE) ×18 IMPLANT
STAPLE RELOAD 45MM GREEN (STAPLE) ×3
STAPLER VASCULAR ECHELON 35 (CUTTER) ×2 IMPLANT
SUT PROLENE 3 0 SH DA (SUTURE) IMPLANT
SUT PROLENE 4 0 RB 1 (SUTURE)
SUT PROLENE 4-0 RB1 .5 CRCL 36 (SUTURE) IMPLANT
SUT SILK  1 MH (SUTURE) ×4
SUT SILK 1 MH (SUTURE) ×8 IMPLANT
SUT SILK 1 TIES 10X30 (SUTURE) IMPLANT
SUT SILK 2 0 SH (SUTURE) IMPLANT
SUT SILK 2 0SH CR/8 30 (SUTURE) ×1 IMPLANT
SUT SILK 3 0SH CR/8 30 (SUTURE) IMPLANT
SUT STEEL 1 (SUTURE) IMPLANT
SUT VIC AB 0 CTX 18 (SUTURE) ×2 IMPLANT
SUT VIC AB 1 CTX 18 (SUTURE) ×1 IMPLANT
SUT VIC AB 1 CTX 36 (SUTURE)
SUT VIC AB 1 CTX36XBRD ANBCTR (SUTURE) IMPLANT
SUT VIC AB 2-0 CTX 36 (SUTURE) ×1 IMPLANT
SUT VIC AB 2-0 UR6 27 (SUTURE) IMPLANT
SUT VIC AB 3-0 SH 8-18 (SUTURE) IMPLANT
SUT VIC AB 3-0 X1 27 (SUTURE) ×1 IMPLANT
SUT VICRYL 0 UR6 27IN ABS (SUTURE) IMPLANT
SUT VICRYL 2 TP 1 (SUTURE) ×3 IMPLANT
SYR 20CC LL (SYRINGE) ×1 IMPLANT
SYSTEM SAHARA CHEST DRAIN ATS (WOUND CARE) ×3 IMPLANT
TAPE CLOTH SURG 6X10 WHT LF (GAUZE/BANDAGES/DRESSINGS) ×2 IMPLANT
TAPE UMBILICAL COTTON 1/8X30 (MISCELLANEOUS) ×2 IMPLANT
TIP APPLICATOR SPRAY EXTEND 16 (VASCULAR PRODUCTS) ×1 IMPLANT
TOWEL GREEN STERILE (TOWEL DISPOSABLE) ×9 IMPLANT
TOWEL GREEN STERILE FF (TOWEL DISPOSABLE) ×3 IMPLANT
TRAP SPECIMEN MUCOUS 40CC (MISCELLANEOUS) IMPLANT
TRAY FOLEY W/METER SILVER 16FR (SET/KITS/TRAYS/PACK) ×3 IMPLANT
TROCAR BLADELESS 12MM (ENDOMECHANICALS) IMPLANT
TROCAR XCEL 12X100 BLDLESS (ENDOMECHANICALS) ×1 IMPLANT
TUBE CONNECTING 12X1/4 (SUCTIONS) ×1 IMPLANT
TUBE CONNECTING 20X1/4 (TUBING) ×3 IMPLANT
TUNNELER SHEATH ON-Q 11GX8 DSP (PAIN MANAGEMENT) ×2 IMPLANT
WATER STERILE IRR 1000ML POUR (IV SOLUTION) ×3 IMPLANT
YANKAUER SUCT BULB TIP NO VENT (SUCTIONS) ×2 IMPLANT

## 2017-09-16 NOTE — Progress Notes (Signed)
EVENING ROUNDS NOTE :     [address]       ,[place] [address]             [phone]                 Day of Surgery Procedure(s) (LRB): VIDEO BRONCHOSCOPY (N/A) VIDEO ASSISTED THORACOSCOPY (VATS)/LUNG RESECTION (Right)  Total Length of Stay:  LOS: 0 days  BP 126/71 (BP Location: Right Arm)   Pulse 68   Temp 97.6 F (36.4 C) (Oral)   Resp 13   SpO2 100%   .Intake/Output      03/25 0701 - 03/26 0700 03/26 0701 - 03/27 0700   I.V.  1100   Other  250   Total Intake  1350   Urine  340   Blood  50   Chest Tube  60   Total Output  450   Net  +900          . bupivacaine 0.5 % ON-Q pump SINGLE CATH 400 mL    .  ceFAZolin (ANCEF) IV 2 g (09/16/17 1807)  . dextrose 5 % and 0.45% NaCl 100 mL/hr at 09/16/17 1815  . potassium chloride       Lab Results  Component Value Date   WBC 8.2 09/11/2017   HGB 12.5 09/11/2017   HCT 37.1 09/11/2017   PLT 309 09/11/2017   GLUCOSE 90 09/11/2017   ALT 17 09/11/2017   AST 19 09/11/2017   NA 135 09/11/2017   K 4.3 09/11/2017   CL 105 09/11/2017   CREATININE 0.58 09/11/2017   BUN 9 09/11/2017   CO2 20 (L) 09/11/2017   INR 0.97 09/11/2017   Patient stable and stepdown very minimal air leak, good respiratory effort.  Nurse is at bedside working with the patient on incentive spirometer Minimal drainage from the chest tube.   [name] MD  Beeper [phone] Office [phone] 09/16/2017 6:33 PM

## 2017-09-16 NOTE — Anesthesia Preprocedure Evaluation (Signed)
Anesthesia Evaluation  Patient identified by MRN, date of birth, ID band Patient awake    Reviewed: Allergy & Precautions, NPO status , Patient's Chart, lab work & pertinent test results  Airway Mallampati: I  TM Distance: >3 FB Neck ROM: Full    Dental   Pulmonary COPD, former smoker,    Pulmonary exam normal        Cardiovascular Normal cardiovascular exam     Neuro/Psych    GI/Hepatic   Endo/Other    Renal/GU      Musculoskeletal   Abdominal   Peds  Hematology   Anesthesia Other Findings   Reproductive/Obstetrics                             Anesthesia Physical Anesthesia Plan  ASA: III  Anesthesia Plan: General   Post-op Pain Management:    Induction: Intravenous  PONV Risk Score and Plan: 3 and Ondansetron, Dexamethasone, Midazolam and Treatment may vary due to age or medical condition  Airway Management Planned: Double Lumen EBT  Additional Equipment: Arterial line, CVP and Ultrasound Guidance Line Placement  Intra-op Plan:   Post-operative Plan: Extubation in OR  Informed Consent: I have reviewed the patients History and Physical, chart, labs and discussed the procedure including the risks, benefits and alternatives for the proposed anesthesia with the patient or authorized representative who has indicated his/her understanding and acceptance.     Plan Discussed with: CRNA and Surgeon  Anesthesia Plan Comments:         Anesthesia Quick Evaluation

## 2017-09-16 NOTE — Anesthesia Procedure Notes (Signed)
Central Venous Catheter Insertion Performed by: Lillia Abed, MD, anesthesiologist Patient location: Pre-op. Preanesthetic checklist: patient identified, IV checked, risks and benefits discussed, surgical consent, monitors and equipment checked, pre-op evaluation, timeout performed and anesthesia consent Lidocaine 1% used for infiltration and patient sedated Hand hygiene performed  and maximum sterile barriers used  Catheter size: 8 Fr Total catheter length 16. Central line was placed.Double lumen Procedure performed using ultrasound guided technique. Ultrasound Notes:anatomy identified, needle tip was noted to be adjacent to the nerve/plexus identified, no ultrasound evidence of intravascular and/or intraneural injection and image(s) printed for medical record Attempts: 1 Following insertion, dressing applied, line sutured and Biopatch. Post procedure assessment: blood return through all ports, free fluid flow and no air  Patient tolerated the procedure well with no immediate complications.

## 2017-09-16 NOTE — Progress Notes (Signed)
Patient arrived from PACU. Patient arrived after having a bronchoscopy right video assisted thoracoscopy lung resection. Patient arrived  with Arterial Line (pressure at 161/83) map (111), Foley Cath 37mL output, Central Line Double Lumen with Lactated Ringer running at 80ml/hr in the distal luman., 2 Chest Tubes with Y site attached to the [place] system attached to suction at 20cm, and PCA pump (fentanyl infusing 148mcg cleared from patient history in pump), O2 Nasal Canula at 2L. CHG given and telemetry applied and CCMD notified.  Patient VS are stable. Patient is not complaining of pain and is resting comfortably in bed. Will continue to monitor patient.

## 2017-09-16 NOTE — Anesthesia Postprocedure Evaluation (Signed)
Anesthesia Post Note  Patient: Margaret Charles  Procedure(s) Performed: VIDEO BRONCHOSCOPY (N/A ) VIDEO ASSISTED THORACOSCOPY (VATS)/LUNG RESECTION (Right Chest)     Patient location during evaluation: PACU Anesthesia Type: General Level of consciousness: awake and alert Pain management: pain level controlled Vital Signs Assessment: post-procedure vital signs reviewed and stable Respiratory status: spontaneous breathing, nonlabored ventilation, respiratory function stable and patient connected to nasal cannula oxygen Cardiovascular status: blood pressure returned to baseline and stable Postop Assessment: no apparent nausea or vomiting Anesthetic complications: no    Last Vitals:  Vitals:   09/16/17 1315 09/16/17 1330  BP: 103/65 (!) 106/59  Pulse: (!) 57 63  Resp: 13 14  Temp:    SpO2: 100% 100%    Last Pain:  Vitals:   09/16/17 1300  TempSrc:   PainSc: Asleep                 Celester Lech DAVID

## 2017-09-16 NOTE — H&P (Signed)
[address]       [place] [address]             [phone]                    Margaret Charles [place] Medical Record #[id number] Date of Birth: 11/15/1950  Referring: Juanito Doom, MD Primary Care: Helane Rima, MD Primary Cardiologist: Dr Cathie Olden   Chief Complaint:        Chief Complaint  Patient presents with  . Lung Lesion    Surgical eval, Chest CT 08/05/17, PET Scan 08/25/17, PFT's 08/28/17    History of Present Illness:    Margaret Charles 67 y.o. female is seen in the office  today for evaluation 1.4 cm lung nodule noted on noted on lung cancer screening CT scan done at [facility].  The patient has a previous history of smoking approximately 40 years she quit 7 years ago.    Patient remains active walking up to 3 miles a day without shortness of breath   Current Activity/ Functional Status:  Patient is independent with mobility/ambulation, transfers, ADL's, IADL's.   Zubrod Score: At the time of surgery this patient's most appropriate activity status/level should be described as: [x]     0    Normal activity, no symptoms []     1    Restricted in physical strenuous activity but ambulatory, able to do out light work []     2    Ambulatory and capable of self care, unable to do work activities, up and about               >50 % of waking hours                              []     3    Only limited self care, in bed greater than 50% of waking hours []     4    Completely disabled, no self care, confined to bed or chair []     5    Moribund   Past Medical History:  Diagnosis Date  . Arthritis    ddd  . Cancer ([place])    skin basal cell cancer  . Chronic headaches   . Emphysema of lung ([place])   . Palpitations   . RBBB (right bundle branch block)    palpitations  . Seizures ([place])    1980s few ??? seizure activity  (vagal response)    Past Surgical History:  Procedure Laterality Date  . BLADDER SURGERY     used small int to build  bladder  . CATARACT EXTRACTION W/ INTRAOCULAR LENS  IMPLANT, BILATERAL    . COLON SURGERY     resection small intestine from blockage(x2)  . obstructions     x2 bowel obstruction surgeries  . WRIST SURGERY      Family History  Problem Relation Age of Onset  . Dementia Mother   . Prostate cancer Father   . Breast cancer Sister   . CAD Sister    Patient is has no children sister has recently had myocardial infarction and diagnosed with breast cancer second sister has known ascending aortic aneurysm  Social History   Socioeconomic History  . Marital status: Single    Spouse name: Not on file  Occupational History  . Not on file  Tobacco Use  . Smoking status: Former Smoker  Packs/day: 1.00    Years: 40.00    Pack years: 40.00    Types: Cigarettes    Last attempt to quit: 12/03/2008    Years since quitting: 8.7  . Smokeless tobacco: Never Used  Substance and Sexual Activity  . Alcohol use: Not Currently    Comment: hx  etoh  38 yrs  . Drug use: Never    Social History   Tobacco Use  Smoking Status Former Smoker  . Packs/day: 1.00  . Years: 40.00  . Pack years: 40.00  . Types: Cigarettes  . Last attempt to quit: 12/03/2008  . Years since quitting: 8.7  Smokeless Tobacco Never Used    Social History   Substance and Sexual Activity  Alcohol Use Not Currently   Comment: hx  etoh  38 yrs     Allergies  Allergen Reactions  . Codeine Nausea And Vomiting    Current Facility-Administered Medications  Medication Dose Route Frequency Provider Last Rate Last Dose  . ceFAZolin (ANCEF) IVPB 2g/100 mL premix  2 g Intravenous 30 min Pre-Op [name], MD       Facility-Administered Medications Ordered in Other Encounters  Medication Dose Route Frequency Provider Last Rate Last Dose  . lactated ringers infusion    Continuous PRN Judeth Cornfield T, CRNA        Pertinent items are noted in HPI.   Review of Systems:  Review of Systems  Constitutional:  Negative.   HENT: Negative.   Eyes: Negative.   Respiratory: Positive for sputum production. Negative for cough, hemoptysis, shortness of breath and wheezing.   Cardiovascular: Positive for palpitations. Negative for chest pain, orthopnea, claudication, leg swelling and PND.  Gastrointestinal: Negative.   Genitourinary: Negative.   Musculoskeletal: Positive for back pain, joint pain, myalgias and neck pain. Negative for falls.  Skin: Negative.   Neurological: Positive for seizures.  Endo/Heme/Allergies: Negative.   Psychiatric/Behavioral: Negative.     Physical Exam: BP (!) 151/94   Pulse 74   Temp 97.9 F (36.6 C) (Oral)   Resp 20   SpO2 100%   PHYSICAL EXAMINATION: General appearance: alert, cooperative and appears stated age Head: Normocephalic, without obvious abnormality, atraumatic Neck: no adenopathy, no carotid bruit, no JVD, supple, symmetrical, trachea midline and thyroid not enlarged, symmetric, no tenderness/mass/nodules Lymph nodes: Cervical, supraclavicular, and axillary nodes normal. Resp: clear to auscultation bilaterally Back: symmetric, no curvature. ROM normal. No CVA tenderness. Cardio: regular rate and rhythm, S1, S2 normal, no murmur, click, rub or gallop GI: soft, non-tender; bowel sounds normal; no masses,  no organomegaly Extremities: extremities normal, atraumatic, no cyanosis or edema and Homans sign is negative, no sign of DVT Neurologic: Grossly normal  Diagnostic Studies & Laboratory data:     Recent Radiology Findings:  Dg Chest 2 View  Result Date: 09/16/2017 CLINICAL DATA:  Preoperative radiograph EXAM: CHEST - 2 VIEW COMPARISON:  None. FINDINGS: The heart size and mediastinal contours are within normal limits. Both lungs are clear. The visualized skeletal structures are unremarkable. IMPRESSION: No active cardiopulmonary disease. Electronically Signed   By: Ulyses Jarred M.D.   On: 09/16/2017 06:34   IMPRESSION: Suspicious appearing right  lower lobe pulmonary nodule with average diameter of 14 mm.  RECOMMENDATIONS: 3 month follow-up with low-dose CT (CPT code [phone], specify low dose protocol) OR further evaluation with PET/CT.  Lung-RADSTM CATEGORY:Category 4A, suspicious    Electronically Signed by: Sol Passer  Result Narrative  CT CHEST - LUNG CANCER SCREENING  COMPARISON:None  INDICATION: Lung cancer annual screening, asymptomatic, smoker hx or current (less than 30 pack-yrs)  Personal history of tobacco use/personal history of nicotine dependence? Yes  TECHNIQUE:Noncontrast screening chest CT was performed. Reduced radiation dose screening protocol was utilized. 3-D maximal intensity projection images were constructed and reviewed, along with axial and 2-dimensional sagittal reformatted images. The  data was post processed used [place] for enhanced nodule analysis, comparison, and detection.  Please note that the nodule numbering system utilized in this report is based on DynaCAD Lung analysis and may not necessarily be sequential. In cases with numerous small nodules only the largest or most suspicious may be detailed.  FINDINGS:   Lungs and pleura:  Patent central airways. No pleural effusion or pneumothorax. Mild centrilobular emphysema.  Mediastinum/Soft Tissues:  Normal heart size. Mild coronary calcifications. No adenopathy.  Upper Abdomen: Grossly unremarkable allowing for low dose technique.   Bones: No acute or aggressive bony abnormality. Multilevel degenerative changes in the spine.  Nodule assessment:  ---------------------------------------------------------- ID Finding Aug 05, 2017  ========================================================== 1Comment  Segment/LobeRight Lower Lobe   LocationSlice 338 (HTF)   StatusBaseline   TypeSolid  SpiculatedNo   Long Axis 17.6 mm  Short Axis9.6 mm   Average Diameter14 mm  Equivalent Diameter 9.9 mm   Volume513.8 mm3  Mass436.9 mg  Lung-RADS Category4A  ---------------------------------------------------------- 2Comment Probable scar  Segment/LobeRight Upper Lobe   LocationSlice 62 (HTF)   StatusBaseline   TypeSolid  SpiculatedNo   Long Axis 4.4 mm   Short Axis2.9 mm   Average Diameter4 mm   Equivalent Diameter 4.0 mm   Volume33.5 mm3   Mass31.9 mg   Lung-RADS Category2 ----------------------------------------------------------  Other Result Information  Acute Interface, Incoming Rad Results - 08/06/2017  1:37 PM EST CT CHEST - LUNG CANCER SCREENING  COMPARISON:  None   INDICATION: Lung cancer annual screening, asymptomatic, smoker hx or current (less than 30 pack-yrs)  Personal history of tobacco use/personal history of nicotine dependence? Yes  TECHNIQUE:  Noncontrast screening chest CT was performed. Reduced radiation dose screening protocol  was utilized. 3-D maximal intensity projection images were constructed and reviewed, along with axial and 2-dimensional sagittal reformatted images. The  data was post processed used [place] for enhanced nodule analysis, comparison, and detection.  Please note that the nodule numbering system utilized in this report is based on DynaCAD Lung analysis and may not necessarily be sequential. In cases with numerous small nodules only the largest or most suspicious may be detailed.  FINDINGS:   Lungs and pleura:  Patent central airways. No pleural effusion or pneumothorax. Mild centrilobular emphysema.  Mediastinum/Soft Tissues:  Normal heart size. Mild coronary calcifications. No adenopathy.  Upper Abdomen: Grossly unremarkable allowing for low dose technique.   Bones: No acute or aggressive bony abnormality. Multilevel degenerative changes in the spine.  Nodule assessment:  ---------------------------------------------------------- ID   Finding                     Aug 05, 2017              ========================================================== 1    Comment                                                    Segment/Lobe  Right Lower Lobe               Location                    Slice 861 (HTF)                Status                      Baseline                       Type                        Solid                          Spiculated                  No                             Long Axis                   17.6 mm                        Short Axis                  9.6 mm                         Average Diameter            14 mm                          Equivalent Diameter         9.9 mm                         Volume                      513.8 mm3                      Mass                        436.9 mg                                  Lung-RADS Category          4A                         ---------------------------------------------------------- 2    Comment                     Probable scar                  Segment/Lobe                Right Upper Lobe               Location  Slice 62 (HTF)                 Status                      Baseline                       Type                        Solid                          Spiculated                  No                             Long Axis                   4.4 mm                         Short Axis                  2.9 mm                         Average Diameter            4 mm                           Equivalent Diameter         4.0 mm                         Volume                      33.5 mm3                       Mass                        31.9 mg                               Lung-RADS Category          2                         ----------------------------------------------------------    IMPRESSION: Suspicious appearing right lower lobe pulmonary nodule with average diameter of 14 mm.  RECOMMENDATIONS: 3 month follow-up with low-dose CT (CPT code [phone], specify low dose protocol) OR further evaluation with PET/CT.  Lung-RADSTM CATEGORY:  Category 4A, suspicious    Electronically Signed by: Sol Passer      Nm Pet Image Initial (pi) Skull Base To Thigh  Result Date: 08/25/2017 CLINICAL DATA:  Initial treatment strategy for right lung nodule. EXAM: NUCLEAR MEDICINE PET SKULL BASE TO THIGH TECHNIQUE: 7.1 mCi F-18 FDG was injected intravenously. Full-ring PET imaging was performed from the skull base to thigh after the radiotracer. CT data was obtained and used for attenuation correction and anatomic localization. Fasting blood glucose: 92 mg/dl Mediastinal blood pool activity:  SUV max 2.3 COMPARISON:  None. FINDINGS: NECK: No hypermetabolic lymph nodes. A hypermetabolic left thyroid lobe nodule is seen which measures approximately 1.3 cm and has SUV max of 6.5. Incidental CT  findings:  None. CHEST: 13 mm pulmonary nodule in the posterior right lower lobe has SUV max of 3.4. No other spine suspicious pulmonary nodules are seen on CT images. No hypermetabolic lymph nodes within the thorax. Incidental CT findings: Mild emphysema. Aortic and coronary artery atherosclerosis. ABDOMEN/PELVIS: No abnormal hypermetabolic activity within the liver, pancreas, adrenal glands, or spleen. No hypermetabolic lymph nodes in the abdomen or pelvis. Incidental CT findings:  None. SKELETON: No focal hypermetabolic bone lesions to suggest skeletal metastasis. Incidental CT findings:  None. IMPRESSION: 13 mm hypermetabolic right lower lobe pulmonary nodule, highly suspicious for primary bronchogenic carcinoma. No evidence of thoracic nodal or distant metastatic disease. Hypermetabolic 1.3 cm left thyroid lobe nodule. Ultrasound-guided thyroid needle aspiration biopsy is recommended to rule out thyroid carcinoma. Electronically Signed   By: Earle Gell M.D.   On: 08/25/2017 15:57     I have independently reviewed the above radiologic studies.  Recent Lab Findings: Lab Results  Component Value Date   WBC 8.2 09/11/2017   HGB 12.5 09/11/2017   HCT 37.1 09/11/2017   PLT 309 09/11/2017   GLUCOSE 90 09/11/2017   ALT 17 09/11/2017   AST 19 09/11/2017   NA 135 09/11/2017   K 4.3 09/11/2017   CL 105 09/11/2017   CREATININE 0.58 09/11/2017   BUN 9 09/11/2017   CO2 20 (L) 09/11/2017   INR 0.97 09/11/2017    PFT's- FEV1 1.98 78%  DLCO 17.97 66% Assessment / Plan:   Patient with discovery of superior segment right lower lobe 1.3 cm lung mass highly suspicious for malignancy, if lung cancer would be clinical stage I.  I discussed with the patient proceeding directly with surgical resection.  Her activity level and PFTs are suitable to allow resection.  She was cleared by   cardiology ,  the patient preop although no previous history of myocardial infarction or stent placement the patient has a  history of right bundle branch block, PACs.      Plan  bronchoscopy right video-assisted thoracoscopy lung resection March 26.  The risks and options of surgical resection discussed with the patient in detail. The goals risks and alternatives of the planned surgical procedure bronchoscopy with right video-assisted thoracoscopy lung resection have been discussed with the patient in detail. The risks of the procedure including death, infection, stroke, myocardial infarction, bleeding, blood transfusion have all been discussed specifically.  I have quoted [name] a2 % of perioperative mortality and a complication rate as high as 30%. The patient's questions have been answered.Margaret Charles is willing  to proceed with the planned procedure.    [name] MD      [address] [place] [address] Office [phone]   Beeper [phone]  09/16/2017 7:18 AM

## 2017-09-16 NOTE — Anesthesia Procedure Notes (Signed)
Arterial Line Insertion Start/End3/26/2019 7:00 AM, 09/16/2017 7:10 AM Performed by: Verdie Drown, CRNA, CRNA  Patient location: Pre-op. Preanesthetic checklist: patient identified, IV checked, site marked, risks and benefits discussed, surgical consent, monitors and equipment checked, pre-op evaluation, timeout performed and anesthesia consent Lidocaine 1% used for infiltration Left, radial was placed Catheter size: 20 G Hand hygiene performed , maximum sterile barriers used  and Seldinger technique used Allen's test indicative of satisfactory collateral circulation Attempts: 1 Procedure performed without using ultrasound guided technique. Following insertion, Biopatch and dressing applied. Post procedure assessment: unchanged  Patient tolerated the procedure well with no immediate complications. Additional procedure comments: SRNA.

## 2017-09-16 NOTE — Brief Op Note (Addendum)
      [address]       [place] [address]             [phone]      09/16/2017  11:07 AM  PATIENT:  Margaret Charles  67 y.o. female  PRE-OPERATIVE DIAGNOSIS:  superior segment right lower lobe 1.3 cm lung mass  POST-OPERATIVE DIAGNOSIS:  superior segment right lower lobe 1.3 cm lung mass  PROCEDURE:  Procedure(s): VIDEO BRONCHOSCOPY (N/A)  RIGHT VIDEO ASSISTED THORACOSCOPY/MINI THORACOTOMY -Wedge Resection RLL -Completion Lobectomy RLL -Lymph Node Dissection -Insertion of ON-Q Anesthetic Pain Catheter  SURGEON:  Surgeon(s) and Role:    * [name], MD - Primary  PHYSICIAN ASSISTANT: Ellwood Handler PA-C  ANESTHESIA:   general  EBL:  50 mL   BLOOD ADMINISTERED:none  DRAINS: 28 Straight, 28 Blake   LOCAL MEDICATIONS USED:  MARCAINE     SPECIMEN:  Source of Specimen:  RLL, Lymph Nodes, Wedge Resection RLL  DISPOSITION OF SPECIMEN:  PATHOLOGY  COUNTS:  YES   DICTATION: .Dragon Dictation  PLAN OF CARE: Admit to inpatient   PATIENT DISPOSITION:  PACU - hemodynamically stable.   Delay start of Pharmacological VTE agent (>24hrs) due to surgical blood loss or risk of bleeding: no

## 2017-09-16 NOTE — Transfer of Care (Signed)
Immediate Anesthesia Transfer of Care Note  Patient: Margaret Charles  Procedure(s) Performed: VIDEO BRONCHOSCOPY (N/A ) VIDEO ASSISTED THORACOSCOPY (VATS)/LUNG RESECTION (Right Chest)  Patient Location: PACU  Anesthesia Type:General  Level of Consciousness: awake and patient cooperative  Airway & Oxygen Therapy: Patient Spontanous Breathing and Patient connected to face mask oxygen  Post-op Assessment: Report given to RN, Post -op Vital signs reviewed and stable and Patient moving all extremities  Post vital signs: Reviewed and stable  Last Vitals:  Vitals Value Taken Time  BP 137/61 09/16/2017 11:27 AM  Temp    Pulse 63 09/16/2017 11:31 AM  Resp 19 09/16/2017 11:31 AM  SpO2 100 % 09/16/2017 11:31 AM  Vitals shown include unvalidated device data.  Last Pain:  Vitals:   09/16/17 0615  TempSrc:   PainSc: 0-No pain      Patients Stated Pain Goal: 3 (16/10/96 0454)  Complications: No apparent anesthesia complications

## 2017-09-17 ENCOUNTER — Other Ambulatory Visit: Payer: Self-pay

## 2017-09-17 ENCOUNTER — Inpatient Hospital Stay (HOSPITAL_COMMUNITY): Payer: Medicare Other

## 2017-09-17 ENCOUNTER — Encounter (HOSPITAL_COMMUNITY): Payer: Self-pay | Admitting: Cardiothoracic Surgery

## 2017-09-17 LAB — CBC
HCT: 33.5 % — ABNORMAL LOW (ref 36.0–46.0)
Hemoglobin: 10.9 g/dL — ABNORMAL LOW (ref 12.0–15.0)
MCH: 29.5 pg (ref 26.0–34.0)
MCHC: 32.5 g/dL (ref 30.0–36.0)
MCV: 90.5 fL (ref 78.0–100.0)
Platelets: 279 10*3/uL (ref 150–400)
RBC: 3.7 MIL/uL — AB (ref 3.87–5.11)
RDW: 13.8 % (ref 11.5–15.5)
WBC: 16 10*3/uL — ABNORMAL HIGH (ref 4.0–10.5)

## 2017-09-17 LAB — BASIC METABOLIC PANEL
Anion gap: 7 (ref 5–15)
BUN: 6 mg/dL (ref 6–20)
CALCIUM: 8.5 mg/dL — AB (ref 8.9–10.3)
CHLORIDE: 103 mmol/L (ref 101–111)
CO2: 23 mmol/L (ref 22–32)
CREATININE: 0.54 mg/dL (ref 0.44–1.00)
GFR calc non Af Amer: >60 mL/min (ref >60)
GLUCOSE: 122 mg/dL — AB (ref 65–99)
Potassium: 3.5 mmol/L (ref 3.5–5.1)
Sodium: 133 mmol/L — ABNORMAL LOW (ref 135–145)

## 2017-09-17 LAB — BLOOD GAS, ARTERIAL
Acid-base deficit: 0.5 mmol/L (ref 0.0–2.0)
BICARBONATE: 23.6 mmol/L (ref 20.0–28.0)
Drawn by: 23703
FIO2: 28
O2 SAT: 99.5 %
PH ART: 7.403 (ref 7.350–7.450)
PO2 ART: 154 mmHg — AB (ref 83.0–108.0)
Patient temperature: 98.6
pCO2 arterial: 38.6 mmHg (ref 32.0–48.0)

## 2017-09-17 MED ORDER — SODIUM CHLORIDE 0.9% FLUSH
10.0000 mL | INTRAVENOUS | Status: DC | PRN
  Administered 2017-09-18: 10 mL
  Filled 2017-09-17: qty 40

## 2017-09-17 MED ORDER — SODIUM CHLORIDE 0.9% FLUSH
10.0000 mL | Freq: Two times a day (BID) | INTRAVENOUS | Status: DC
  Administered 2017-09-17 – 2017-09-21 (×10): 10 mL

## 2017-09-17 MED ORDER — KETOROLAC TROMETHAMINE 15 MG/ML IJ SOLN
15.0000 mg | Freq: Four times a day (QID) | INTRAMUSCULAR | Status: DC | PRN
  Administered 2017-09-18 (×2): 15 mg via INTRAVENOUS
  Filled 2017-09-17 (×2): qty 1

## 2017-09-17 NOTE — Progress Notes (Addendum)
      [address]       [place] [address]             [phone]      1 Day Post-Op Procedure(s) (LRB): VIDEO BRONCHOSCOPY (N/A) VIDEO ASSISTED THORACOSCOPY (VATS)/LUNG RESECTION (Right)   Subjective:  Patient had a rough night.  She complains of pain at chest tubes and incision sites.  She has nausea and episodes of vomiting.  She is tolerating liquids  Objective: Vital signs in last 24 hours: Temp:  [97.6 F (36.4 C)-98.3 F (36.8 C)] 98.3 F (36.8 C) (03/27 0415) Pulse Rate:  [54-82] 66 (03/27 0500) Cardiac Rhythm: Normal sinus rhythm;Bundle branch block (03/27 0415) Resp:  [10-25] 16 (03/27 0500) BP: (103-145)/(59-99) 129/71 (03/27 0415) SpO2:  [10 %-100 %] 99 % (03/27 0500) Arterial Line BP: (103-172)/(47-84) 172/84 (03/27 0500) Weight:  [143 lb 4.8 oz (65 kg)] 143 lb 4.8 oz (65 kg) (03/27 0431)  Intake/Output from previous day: 03/26 0701 - 03/27 0700 In: 2965 [P.O.:240; I.V.:2225; IV Piggyback:250] Out: 4193 [Urine:865; Blood:50; Chest Tube:480]  General appearance: alert, cooperative and no distress Heart: regular rate and rhythm Lungs: clear to auscultation bilaterally Abdomen: soft, non-tender; bowel sounds normal; no masses,  no organomegaly Extremities: extremities normal, atraumatic, no cyanosis or edema Wound: clean and dry  Lab Results: Recent Labs    09/17/17 0422  WBC 16.0*  HGB 10.9*  HCT 33.5*  PLT 279   BMET:  Recent Labs    09/17/17 0422  NA 133*  K 3.5  CL 103  CO2 23  GLUCOSE 122*  BUN 6  CREATININE 0.54  CALCIUM 8.5*    PT/INR: No results for input(s): LABPROT, INR in the last 72 hours. ABG    Component Value Date/Time   PHART 7.403 09/17/2017 0430   HCO3 23.6 09/17/2017 0430   ACIDBASEDEF 0.5 09/17/2017 0430   O2SAT 99.5 09/17/2017 0430   CBG (last 3)  No results for input(s): GLUCAP in the last 72 hours.  Assessment/Plan: S/P Procedure(s) (LRB): VIDEO BRONCHOSCOPY (N/A) VIDEO ASSISTED  THORACOSCOPY (VATS)/LUNG RESECTION (Right)  1. Chest tube- sounds like air is sucking around chest tubes, I reinforced dressing, no definitive air leak- will leave on suction today  2. CV-NSR, mild hypertension, likely related to pain will monitor, not on any antihypertensives preoperatively 3. D/C Arterial line 4. Keep IV fluids at 100 for now, can decrease once nausea resolved 5. Pain control- will add low dose Toradol for a few doses 6. Dispo- patient stable, d/c arterial line, possibly d/c foley this afternoon, chest tube on suction, CXR tomorrow   LOS: 1 day    Erin Barrett 09/17/2017  No air leak  But with  tidaling Leave chest in to suction for now I have seen and examined Margaret Charles and agree with the above assessment  and plan.  [name] MD Beeper [phone] Office [phone] 09/17/2017 1:39 PM

## 2017-09-17 NOTE — Progress Notes (Addendum)
PCA pump Fentanyl Syringe replaced. There were ZERO ML's in replaced Fentanyl Syringe this was verified by a 2nd Nurse. Verified settings with a 2nd Nurse. Settings cleared out. Patient states pain is being controlled at current settings. Will continue to monitor patient pain level and comfort.

## 2017-09-17 NOTE — Progress Notes (Signed)
Removed Atrial Line per provider orders. Catheter Intact and patient tolerated removal well. Held pressure on site for 5 minutes. Pressure dressing applied to area. Site is clean dry and intact. Will continue to monitor.

## 2017-09-17 NOTE — Discharge Instructions (Signed)
Thoracotomy, Care After This sheet gives you information about how to care for yourself after your procedure. Your doctor may also give you more specific instructions. If you have problems or questions, contact your doctor. Follow these instructions at home: Preventing lung infection ( pneumonia)  Take deep breaths or do breathing exercises as told by your doctor.  Cough often. Coughing is important to clear thick spit (phlegm) and open your lungs. If coughing hurts, hold a pillow against your chest or place both hands flat on top of your cut (splinting) when you cough. This may help with discomfort.  Use an incentive spirometer as told. This is a tool that measures how well you fill your lungs with each breath.  Do lung therapy (pulmonary rehabilitation) as told. Medicines  Take over-the-counter or prescription medicines only as told by your doctor.  If you have pain, take pain-relieving medicine before your pain gets very bad. This will help you breathe and cough more comfortably.  If you were prescribed an antibiotic medicine, take it as told by your doctor. Do not stop taking the antibiotic even if you start to feel better. Activity  Ask your doctor what activities are safe for you.  Do not travel by airplane for 2 weeks after your chest tube is removed, or until your doctor says that this is safe.  Do not lift anything that is heavier than 10 lb (4.5 kg), or the limit that your doctor tells you, until he or she says that it is safe.  Do not drive until your doctor approves. ? Do not drive or use heavy machinery while taking prescription pain medicine. Incision care  Follow instructions from your doctor about how to take care of your cut from surgery (incision). Make sure you: ? Wash your hands with soap and water before you change your bandage (dressing). If you cannot use soap and water, use hand sanitizer. ? Change your bandage as told by your doctor. ? Leave stitches  (sutures), skin glue, or skin tape (adhesive) strips in place. They may need to stay in place for 2 weeks or longer. If tape strips get loose and curl up, you may trim the loose edges. Do not remove tape strips completely unless your doctor says it is okay.  Keep your bandage dry.  Check your cut from surgery every day for signs of infection. Check for: ? More redness, swelling, or pain. ? More fluid or blood. ? Warmth. ? Pus or a bad smell. Bathing  Do not take baths, swim, or use a hot tub until your doctor approves. You may take showers.  After your bandage has been removed, use soap and water to gently wash your cut from surgery. Do not use anything else to clean your cut unless your doctor tells you to. Eating and drinking  Eat a healthy diet as told by your doctor. A healthy diet includes: ? Fresh fruits and vegetables. ? Whole grains. ? Low-fat (lean) proteins.  Drink enough fluid to keep your pee (urine) clear or pale yellow. General instructions  To prevent or treat trouble pooping (constipation) while you are taking prescription pain medicine, your doctor may recommend that you: ? Take over-the-counter or prescription medicines. ? Eat foods that are high in fiber. These include fresh fruits and vegetables, whole grains, and beans. ? Limit foods that are high in fat and processed sugars, such as fried and sweet foods.  Do not use any products that contain nicotine or tobacco. These include cigarettes  and e-cigarettes. If you need help quitting, ask your doctor.  Avoid secondhand smoke.  Wear compression stockings as told. These help to prevent blood clots and reduce swelling in your legs.  If you have a chest tube, care for it as told.  Keep all follow-up visits as told by your doctor. This is important. Contact a doctor if:  You have more redness, swelling, or pain around your cut from surgery.  You have more fluid or blood coming from your cut from  surgery.  Your cut from surgery feels warm to the touch.  You have pus or a bad smell coming from your cut from surgery.  You have a fever or chills.  Your heartbeat seems uneven.  You feel sick to your stomach (nauseous).  You throw up (vomit).  You have muscle aches.  You have trouble pooping (having a bowel movement). This may mean that you: ? Poop fewer times in a week than normal. ? Have a hard time pooping. ? Have poop that is dry, hard, or bigger than normal. Get help right away if:  You get a rash.  You feel light-headed.  You feel like you might pass out (faint).  You are short of breath.  You have trouble breathing.  You are confused.  You have trouble talking.  You have problems with your seeing (vision).  You are not able to move.  You lose feeling (have numbness) in your: ? Face. ? Arms. ? Legs.  You pass out.  You have a sudden, bad headache.  You feel weak.  You have chest pain.  You have pain that: ? Is very bad. ? Gets worse, even with medicine. Summary  Take deep breaths, do breathing exercises, and cough often. This helps prevent lung infection (pneumonia).  Do not drive until your doctor approves. Do not travel by airplane for 2 weeks after your chest tube is removed, or until your doctor says that this is safe.  Check your cut from surgery every day for signs of infection.  Eat a healthy diet. This includes fresh fruits and vegetables, whole grains, and low-fat (lean) proteins. This information is not intended to replace advice given to you by your health care provider. Make sure you discuss any questions you have with your health care provider. Document Released: 12/10/2011 Document Revised: 03/04/2016 Document Reviewed: 03/04/2016 Elsevier Interactive Patient Education  2017 [employer].

## 2017-09-17 NOTE — Plan of Care (Signed)
  Problem: Pain Managment: Goal: General experience of comfort will improve Outcome: Progressing   

## 2017-09-17 NOTE — Discharge Summary (Signed)
Physician Discharge Summary       [address]       [place] [address]             [phone]    Patient ID: Margaret Charles MRN: [id number] DOB/AGE: [birth year] 67 y.o.  Admit date: 09/16/2017 Discharge date: 09/22/2017  Admission Diagnoses: Superior segment right lower lobe mass  Discharge Diagnoses:  1. S/p right lower lobectomy of lung 2. History of tobacco abuse 3. History of cancer (HCC)-basal cell skin cancer 4. History of emphysema of lung ([place]) 5. RBBB (right bundle branch block) 6. History of seizures (HCC)-seizure activity  (vagal response) 7.History of chronic headaches 8.  History of arthritis  Procedure (s):  RIGHT VIDEO ASSISTED THORACOSCOPY/MINI THORACOTOMY -Wedge Resection RLL -Completion Lobectomy RLL -Lymph Node Dissection -Insertion of ON-Q Anesthetic Pain Catheter by Dr. Servando Snare on 09/16/2017.  Pathology:  1. Lung, wedge biopsy/resection, Right lower lobe - INVASIVE SQUAMOUS CELL CARCINOMA, 1.0 CM - SEE COMMENT 2. Lung, resection (segmental or lobe), Right lower - LUNG WITH EMPHYSEMATOUS CHANGES AND PATCHY CHRONIC INFLAMMATION - NO RESIDUAL CARCINOMA IDENTIFIED 3. Lymph node, biopsy, 12 R - NO CARCINOMA IDENTIFIED IN ONE LYMPH NODE (0/1) 4. Lymph node, biopsy, 11 R - NO CARCINOMA IDENTIFIED IN ONE LYMPH NODE (0/1) 5. Lymph node, biopsy, 10 R - NO CARCINOMA IDENTIFIED IN ONE LYMPH NODE (0/1) 6. Lymph node, biopsy, 10 R #2 - NO CARCINOMA IDENTIFIED IN ONE LYMPH NODE (0/1) 7. Lymph node, biopsy, 11 R #2 - NO CARCINOMA IDENTIFIED IN ONE LYMPH NODE (0/1) 8. Lymph node, biopsy, 8 - NO CARCINOMA IDENTIFIED IN ONE LYMPH NODE (0/1) 9. Lymph node, biopsy, 4 R - NO CARCINOMA IDENTIFIED IN ONE LYMPH NODE (0/1) 10. Lymph node, biopsy, 2 R - NO CARCINOMA IDENTIFIED IN ONE LYMPH NODE (0/1) 11. Lymph node, biopsy, 2 R #2 - NO CARCINOMA IDENTIFIED IN ONE LYMPH NODE (0/1) 12. Lymph node, biopsy, 10 R #3 - BENIGN LUNG PARENCHYMA - NO  LYMPHOID TISSUE IDENTIFIED 1 of  History of Presenting Illness: Margaret Charles 67 y.o. female is seen in the office  today for evaluation 1.4 cm lung nodule noted on noted on lung cancer screening CT scan done at [facility] imaging.  The patient has a previous history of smoking approximately 40 years she quit 7 years ago.    Patient remains active walking up to 3 miles a day without shortness of breath.  Patient with discovery of superior segment right lower lobe 1.3 cm lung mass highly suspicious for malignancy, if lung cancer would be clinical stage I.  Dr. Servando Snare discussed with the patient proceeding directly with surgical resection.  Her activity level and PFTs are suitable to allow resection.  She was cleared by cardiology.  The patient pre op, although no previous history of myocardial infarction or stent placement, the patient has a history of right bundle branch block, PACs.  Dr. Servando Snare discussed the need for bronchoscopy, right VATS, and right lower lobectomy. Potential risks, benefits, and complications of the surgery were discussed with the patient and she agreed to proceed with surgery. She underwent the aforementioned surgery on 09/16/2017.    Brief Hospital Course:  The patient remained afebrile and hemodynamically stable. A line and foley were removed early in the post operative course. She did have nausea and vomiting post op. Chest tube output gradually decreased. She did have air leaking around chest tubes. Dressing was reinforced. Daily chest x rays were obtained and remained stable. Chest tubes were  placed to water seal on 09/18/2017.  Her anterior chest tube was removed on 09/18/2017.  Follow up CXR showed development of a lateral pneumothorax.  Her chest tube also exhibited evidence of a large air leak.  She developed Rapid Atrial Fibrillation and was treated with IV Amiodarone per protocol. She has complained of persistent nausea and gas throughout her stay.  She has moved her  bowels and is responding to anti-emetics. All chest tubes were removed by 09/11/2017. Patient is ambulating on room air. Patient is tolerating diet and has had a bowel movement. Wounds are clean and dry. Final chest X ray showed stable appearance of apical pneumothorax on the right.  There was development of minor sub q emphysema on the right, which she was tolerating without difficulty. Patient is felt surgically stable for discharge today.   Latest Vital Signs: Blood pressure (!) 148/86, pulse 68, temperature 98.3 F (36.8 C), temperature source Oral, resp. rate 17, height 5\' 7"  (1.702 m), weight 143 lb 4.8 oz (65 kg), SpO2 98 %.   Discharge Condition:Stable and discharged to home.  Recent laboratory studies:  Lab Results  Component Value Date   WBC 10.1 09/20/2017   HGB 11.5 (L) 09/20/2017   HCT 33.4 (L) 09/20/2017   MCV 87.9 09/20/2017   PLT 121 (L) 09/20/2017   Lab Results  Component Value Date   NA 135 09/20/2017   K 3.4 (L) 09/20/2017   CL 102 09/20/2017   CO2 22 09/20/2017   CREATININE 0.58 09/20/2017   GLUCOSE 103 (H) 09/20/2017      Diagnostic Studies: Dg Chest 2 View  Result Date: 09/22/2017 CLINICAL DATA:  Right-sided pneumothorax EXAM: CHEST - 2 VIEW COMPARISON:  September 21, 2017 FINDINGS: Right chest tube has been removed. Right jugular catheter tip is in the superior vena cava, stable. There is a stable right apical pneumothorax without tension component. Pneumothorax appears unchanged in size and contour. There is, however, extensive subcutaneous air on the right, a new finding. There is a small right pleural effusion with right base atelectasis. Left lung is clear. Heart size and pulmonary vascularity are normal. No adenopathy. There is aortic atherosclerosis. IMPRESSION: Stable pneumothorax following chest tube removal. There is extensive subcutaneous air on the right. There is a small right pleural effusion with mild left base atelectasis. Left lung clear. Stable  cardiac silhouette. There is aortic atherosclerosis. No change in central catheter placement. Aortic Atherosclerosis (ICD10-I70.0). Electronically Signed   By: Lowella Grip III M.D.   On: 09/22/2017 08:01   Dg Chest 2 View  Result Date: 09/16/2017 CLINICAL DATA:  Preoperative radiograph EXAM: CHEST - 2 VIEW COMPARISON:  None. FINDINGS: The heart size and mediastinal contours are within normal limits. Both lungs are clear. The visualized skeletal structures are unremarkable. IMPRESSION: No active cardiopulmonary disease. Electronically Signed   By: Ulyses Jarred M.D.   On: 09/16/2017 06:34   Nm Pet Image Initial (pi) Skull Base To Thigh  Result Date: 08/25/2017 CLINICAL DATA:  Initial treatment strategy for right lung nodule. EXAM: NUCLEAR MEDICINE PET SKULL BASE TO THIGH TECHNIQUE: 7.1 mCi F-18 FDG was injected intravenously. Full-ring PET imaging was performed from the skull base to thigh after the radiotracer. CT data was obtained and used for attenuation correction and anatomic localization. Fasting blood glucose: 92 mg/dl Mediastinal blood pool activity: SUV max 2.3 COMPARISON:  None. FINDINGS: NECK: No hypermetabolic lymph nodes. A hypermetabolic left thyroid lobe nodule is seen which measures approximately 1.3 cm and has SUV max  of 6.5. Incidental CT findings:  None. CHEST: 13 mm pulmonary nodule in the posterior right lower lobe has SUV max of 3.4. No other spine suspicious pulmonary nodules are seen on CT images. No hypermetabolic lymph nodes within the thorax. Incidental CT findings: Mild emphysema. Aortic and coronary artery atherosclerosis. ABDOMEN/PELVIS: No abnormal hypermetabolic activity within the liver, pancreas, adrenal glands, or spleen. No hypermetabolic lymph nodes in the abdomen or pelvis. Incidental CT findings:  None. SKELETON: No focal hypermetabolic bone lesions to suggest skeletal metastasis. Incidental CT findings:  None. IMPRESSION: 13 mm hypermetabolic right lower lobe  pulmonary nodule, highly suspicious for primary bronchogenic carcinoma. No evidence of thoracic nodal or distant metastatic disease. Hypermetabolic 1.3 cm left thyroid lobe nodule. Ultrasound-guided thyroid needle aspiration biopsy is recommended to rule out thyroid carcinoma. Electronically Signed   By: Earle Gell M.D.   On: 08/25/2017 15:57   Dg Chest Port 1 View  Result Date: 09/21/2017 CLINICAL DATA:  Follow-up pneumothorax. EXAM: PORTABLE CHEST 1 VIEW COMPARISON:  09/20/2017 FINDINGS: There is a right sided chest tube in place. Tip overlies the cavoatrial junction. The right apical pneumothorax measures 9 mm. Unchanged from previous exam. No pleural effusion or edema. No airspace opacities. IMPRESSION: 1. Persistent right apical pneumothorax measuring 9 mm. Similar to previous exam. Electronically Signed   By: Kerby Moors M.D.   On: 09/21/2017 10:00   Dg Chest Port 1 View  Result Date: 09/20/2017 CLINICAL DATA:  Pneumothorax EXAM: PORTABLE CHEST 1 VIEW COMPARISON:  Yesterday FINDINGS: Right-sided chest tube is slightly lower than before. Right IJ central line with tip at the SVC. Small right apical pneumothorax is unchanged. The lungs are clear. Normal heart size and stable aortic contours. IMPRESSION: Unchanged small right apical pneumothorax. Electronically Signed   By: Monte Fantasia M.D.   On: 09/20/2017 08:39   Dg Chest Port 1 View  Result Date: 09/19/2017 CLINICAL DATA:  Followup lobectomy. EXAM: PORTABLE CHEST 1 VIEW COMPARISON:  09/18/2017 FINDINGS: Left chest remains clear. Previous lobectomy on the right. 1 of the 2 chest tubes has been removed. Small amount of pleural air at the left lateral pleural margin. Mild volume loss at the right lung base. IMPRESSION: One of the 2 right chest tubes is removed. Small amount of pleural air at the right upper lateral pleural space. Electronically Signed   By: Nelson Chimes M.D.   On: 09/19/2017 07:53   Dg Chest Port 1 View  Result Date:  09/18/2017 CLINICAL DATA:  Status post RIGHT VATS and RIGHT LOWER lobectomy. EXAM: PORTABLE CHEST 1 VIEW COMPARISON:  09/17/2017 radiograph and prior exams FINDINGS: Two RIGHT thoracostomy tubes and a RIGHT IJ central venous catheter with tip overlying the LOWER SVC again noted. Slight increase in RIGHT basilar opacity/atelectasis identified. There is no evidence of pneumothorax. Cardiomediastinal silhouette is unchanged. No other changes noted. IMPRESSION: Slight increase in RIGHT basilar opacity/atelectasis without other significant change. Electronically Signed   By: Margarette Canada M.D.   On: 09/18/2017 08:25   Dg Chest Port 1 View  Result Date: 09/17/2017 CLINICAL DATA:  Status post lobectomy of lung. History of cancer, palpitations. EXAM: PORTABLE CHEST 1 VIEW COMPARISON:  Chest x-ray dated 09/16/2017. FINDINGS: Right IJ line is stable in position with tip at the level of the mid/lower SVC. 2 right-sided chest tubes are stable in position with tips directed towards the right lung apex. No convincing pneumothorax seen. Lungs are clear. Heart size and mediastinal contours are stable. IMPRESSION: 1. No acute findings. Lungs  are clear. No pleural effusion or pneumothorax seen. 2. Postsurgical changes right lung. Support apparatus appears stable in position. Electronically Signed   By: Franki Cabot M.D.   On: 09/17/2017 09:49   Dg Chest Port 1 View  Result Date: 09/16/2017 CLINICAL DATA:  Right lung surgery. EXAM: PORTABLE CHEST 1 VIEW COMPARISON:  09/16/2017. FINDINGS: Right IJ line noted with its tip at the cavoatrial junction. Right chest tubes are noted with tips over the right apex. No prominent pneumothorax noted. Right apical pleural thickening. Miniscule right apical pneumothorax cannot be totally excluded. Attention to this area on subsequent follow-up exams. Postsurgical changes right lung. No pleural effusion or pneumothorax. Heart size stable. IMPRESSION: 1. Right IJ line noted with tip over  cavoatrial junction. Right chest tubes noted with tips over the right apex. Mild apical pleural thickening noted. Miniscule right apical pneumothorax cannot be totally excluded, attention to this region on subsequent exams suggested. No prominent pneumothorax noted. 2. Postsurgical changes right lung. No acute cardiopulmonary disease identified. Electronically Signed   By: Marcello Moores  Register   On: 09/16/2017 12:32         Discharge Medications: Allergies as of 09/22/2017      Reactions   Codeine Nausea And Vomiting      Medication List    TAKE these medications   amiodarone 200 MG tablet Commonly known as:  PACERONE Take 2 tablets (400 mg total) by mouth 2 (two) times daily. For 5 days, then decrease to 200 mg daily   CENTRUM ADULTS PO Take 1 tablet by mouth daily.   ibuprofen 200 MG tablet Commonly known as:  ADVIL,MOTRIN Take 600-800 mg by mouth every 8 (eight) hours as needed for headache, mild pain or moderate pain.   metoprolol tartrate 25 MG tablet Commonly known as:  LOPRESSOR Take 1 tablet (25 mg total) by mouth 2 (two) times daily.   ondansetron 4 MG tablet Commonly known as:  ZOFRAN Take 1 tablet (4 mg total) by mouth daily as needed for nausea or vomiting.   OVER THE COUNTER MEDICATION Take 1 tablet by mouth daily. Chamomile 100mg  vegetarian tablet- 1 tab daily.   oxyCODONE 5 MG immediate release tablet Commonly known as:  Oxy IR/ROXICODONE Take 1 tablet (5 mg total) by mouth every 4 (four) hours as needed for severe pain.   SOOTHE OP Place 1 drop into both eyes 2 (two) times daily as needed (dry eyes).       Follow Up Appointments: Follow-up Information    [name], MD. Go on 09/23/2017.   Specialty:  Cardiothoracic Surgery Why:  PA/LAT CXR to be taken (at [facility] which is in the same building as Dr. Everrett Coombe office) on 10/06/2017 at 12:30 pm;Appointment time is at 1:00 pm  Contact information: [address] [place] [address] [phone]        [facility] [facility] Follow up on 09/23/2017.   Specialty:  Cardiothoracic Surgery Why:  please get CXR at 10:00 at [facility] located on first floor of our office building Contact information: [address] [place], [place] [phone]          Signed: Ellamae Sia 09/22/2017, 8:09 AM

## 2017-09-18 ENCOUNTER — Inpatient Hospital Stay (HOSPITAL_COMMUNITY): Payer: Medicare Other

## 2017-09-18 LAB — COMPREHENSIVE METABOLIC PANEL
ALT: 18 U/L (ref 14–54)
ANION GAP: 9 (ref 5–15)
AST: 38 U/L (ref 15–41)
Albumin: 3 g/dL — ABNORMAL LOW (ref 3.5–5.0)
Alkaline Phosphatase: 76 U/L (ref 38–126)
BILIRUBIN TOTAL: 0.8 mg/dL (ref 0.3–1.2)
BUN: 6 mg/dL (ref 6–20)
CHLORIDE: 97 mmol/L — AB (ref 101–111)
CO2: 23 mmol/L (ref 22–32)
Calcium: 7.7 mg/dL — ABNORMAL LOW (ref 8.9–10.3)
Creatinine, Ser: 0.57 mg/dL (ref 0.44–1.00)
GFR calc non Af Amer: >60 mL/min (ref >60)
Glucose, Bld: 104 mg/dL — ABNORMAL HIGH (ref 65–99)
POTASSIUM: 4.2 mmol/L (ref 3.5–5.1)
Sodium: 129 mmol/L — ABNORMAL LOW (ref 135–145)
TOTAL PROTEIN: 5.6 g/dL — AB (ref 6.5–8.1)

## 2017-09-18 LAB — CBC
HEMATOCRIT: 33.7 % — AB (ref 36.0–46.0)
Hemoglobin: 11.3 g/dL — ABNORMAL LOW (ref 12.0–15.0)
MCH: 30.1 pg (ref 26.0–34.0)
MCHC: 33.5 g/dL (ref 30.0–36.0)
MCV: 89.6 fL (ref 78.0–100.0)
PLATELETS: 234 10*3/uL (ref 150–400)
RBC: 3.76 MIL/uL — ABNORMAL LOW (ref 3.87–5.11)
RDW: 13.9 % (ref 11.5–15.5)
WBC: 15.1 10*3/uL — AB (ref 4.0–10.5)

## 2017-09-18 MED ORDER — LEVALBUTEROL HCL 0.63 MG/3ML IN NEBU: 0.6300 mg | INHALATION_SOLUTION | Freq: Four times a day (QID) | RESPIRATORY_TRACT | Status: DC | PRN

## 2017-09-18 MED ORDER — METOCLOPRAMIDE HCL 5 MG/ML IJ SOLN
10.0000 mg | Freq: Three times a day (TID) | INTRAMUSCULAR | Status: AC
  Administered 2017-09-18 (×4): 10 mg via INTRAVENOUS
  Filled 2017-09-18 (×4): qty 2

## 2017-09-18 NOTE — Progress Notes (Signed)
D/C 'd right anterior chest tube per Md order and hospital policy. Patient tolerated well without complaints of shortness of breath or chest pain Patient resting comfortably.

## 2017-09-18 NOTE — Progress Notes (Addendum)
      [address]       [place] [address]             [phone]      2 Days Post-Op Procedure(s) (LRB): VIDEO BRONCHOSCOPY (N/A) VIDEO ASSISTED THORACOSCOPY (VATS)/LUNG RESECTION (Right)   Subjective:  No new complaints.  She is feeling better than yesterday.  She still remains nauseated which is worse when she attempts to get up.  She states she has had issues with reflux in the past as she has had several abdominal surgeries.  Objective: Vital signs in last 24 hours: Temp:  [98.2 F (36.8 C)-98.3 F (36.8 C)] 98.3 F (36.8 C) (03/28 0508) Pulse Rate:  [68-79] 79 (03/28 0508) Cardiac Rhythm: Normal sinus rhythm (03/28 0700) Resp:  [15-26] 20 (03/28 0508) BP: (118-152)/(72-85) 149/85 (03/28 0508) SpO2:  [95 %-100 %] 96 % (03/28 0508) FiO2 (%):  [21 %] 21 % (03/27 1402)  Intake/Output from previous day: 03/27 0701 - 03/28 0700 In: 374 [P.O.:364; I.V.:10] Out: 1210 [Urine:400; Chest Tube:810]  General appearance: alert, cooperative and no distress Heart: regular rate and rhythm Lungs: clear to auscultation bilaterally Abdomen: soft, non-tender; bowel sounds normal; no masses,  no organomegaly Extremities: extremities normal, atraumatic, no cyanosis or edema Wound: clean and dry  Lab Results: Recent Labs    09/17/17 0422 09/18/17 0518  WBC 16.0* 15.1*  HGB 10.9* 11.3*  HCT 33.5* 33.7*  PLT 279 234   BMET:  Recent Labs    09/17/17 0422 09/18/17 0518  NA 133* 129*  K 3.5 4.2  CL 103 97*  CO2 23 23  GLUCOSE 122* 104*  BUN 6 6  CREATININE 0.54 0.57  CALCIUM 8.5* 7.7*    PT/INR: No results for input(s): LABPROT, INR in the last 72 hours. ABG    Component Value Date/Time   PHART 7.403 09/17/2017 0430   HCO3 23.6 09/17/2017 0430   ACIDBASEDEF 0.5 09/17/2017 0430   O2SAT 99.5 09/17/2017 0430   CBG (last 3)  No results for input(s): GLUCAP in the last 72 hours.  Assessment/Plan: S/P Procedure(s) (LRB): VIDEO BRONCHOSCOPY  (N/A) VIDEO ASSISTED THORACOSCOPY (VATS)/LUNG RESECTION (Right)  1. Chest tube- no air leak present, CXR remains stable- will place chest tubes to water seal, can possibly d/c one chest tube today 2. CV- NSR, remains hypertensive with SBP in the 150s at times 3. GI- persistent nausea, remains on clear liquid diet which we will advance as she tolerates, getting Zofran prn, will start scheduled reglan to see if we can get better control of nausea 4. Dispo- patient stable, chest tubes to water seal today, possibly remove 1 chest tube today, start reglan for additional nausea support   LOS: 2 days    Margaret Charles 09/18/2017  D/c anterior chest tube today Path still pending  I have seen and examined Margaret Charles and agree with the above assessment  and plan.  Margaret Isaac MD [facility] [phone] Office [phone] 09/18/2017 8:44 AM

## 2017-09-18 NOTE — Plan of Care (Signed)
  Problem: Health Behavior/Discharge Planning: Goal: Ability to manage health-related needs will improve 09/18/2017 0359 by Kayleen Memos, RN Outcome: Progressing 09/18/2017 0357 by Kayleen Memos, RN Outcome: Progressing   Problem: Clinical Measurements: Goal: Ability to maintain clinical measurements within normal limits will improve 09/18/2017 0359 by Kayleen Memos, RN Outcome: Progressing 09/18/2017 0357 by Kayleen Memos, RN Outcome: Progressing   Problem: Clinical Measurements: Goal: Will remain free from infection Outcome: Progressing   Problem: Activity: Goal: Risk for activity intolerance will decrease 09/18/2017 0359 by Kayleen Memos, RN Outcome: Progressing 09/18/2017 0357 by Kayleen Memos, RN Outcome: Progressing

## 2017-09-19 ENCOUNTER — Other Ambulatory Visit: Payer: Self-pay

## 2017-09-19 ENCOUNTER — Inpatient Hospital Stay (HOSPITAL_COMMUNITY): Payer: Medicare Other

## 2017-09-19 DIAGNOSIS — C760 Malignant neoplasm of head, face and neck: Secondary | ICD-10-CM

## 2017-09-19 LAB — COMPREHENSIVE METABOLIC PANEL
ALK PHOS: 82 U/L (ref 38–126)
ALT: 25 U/L (ref 14–54)
ANION GAP: 16 — AB (ref 5–15)
AST: 34 U/L (ref 15–41)
Albumin: 2.8 g/dL — ABNORMAL LOW (ref 3.5–5.0)
BILIRUBIN TOTAL: 0.8 mg/dL (ref 0.3–1.2)
BUN: <5 mg/dL — ABNORMAL LOW (ref 6–20)
CALCIUM: 8.1 mg/dL — AB (ref 8.9–10.3)
CO2: 24 mmol/L (ref 22–32)
CREATININE: 0.51 mg/dL (ref 0.44–1.00)
Chloride: 94 mmol/L — ABNORMAL LOW (ref 101–111)
GFR calc non Af Amer: >60 mL/min (ref >60)
Glucose, Bld: 136 mg/dL — ABNORMAL HIGH (ref 65–99)
Potassium: 2.9 mmol/L — ABNORMAL LOW (ref 3.5–5.1)
SODIUM: 134 mmol/L — AB (ref 135–145)
TOTAL PROTEIN: 5.4 g/dL — AB (ref 6.5–8.1)

## 2017-09-19 LAB — MAGNESIUM: MAGNESIUM: 1.6 mg/dL — AB (ref 1.7–2.4)

## 2017-09-19 LAB — TSH: TSH: 4.392 u[IU]/mL (ref 0.350–4.500)

## 2017-09-19 MED ORDER — AMIODARONE HCL 200 MG PO TABS
200.0000 mg | ORAL_TABLET | Freq: Two times a day (BID) | ORAL | Status: DC
  Administered 2017-09-19 – 2017-09-20 (×2): 200 mg via ORAL
  Filled 2017-09-19 (×2): qty 1

## 2017-09-19 MED ORDER — AMIODARONE HCL IN DEXTROSE 360-4.14 MG/200ML-% IV SOLN: 30.0000 mg/h | INTRAVENOUS | Status: DC

## 2017-09-19 MED ORDER — AMIODARONE HCL IN DEXTROSE 360-4.14 MG/200ML-% IV SOLN
60.0000 mg/h | INTRAVENOUS | Status: AC
  Administered 2017-09-19: 60 mg/h via INTRAVENOUS

## 2017-09-19 MED ORDER — METOCLOPRAMIDE HCL 5 MG/ML IJ SOLN
10.0000 mg | Freq: Four times a day (QID) | INTRAMUSCULAR | Status: DC
  Administered 2017-09-19 – 2017-09-22 (×7): 10 mg via INTRAVENOUS
  Filled 2017-09-19 (×8): qty 2

## 2017-09-19 MED ORDER — POTASSIUM CHLORIDE 10 MEQ/50ML IV SOLN
10.0000 meq | INTRAVENOUS | Status: AC
  Administered 2017-09-19 (×3): 10 meq via INTRAVENOUS
  Filled 2017-09-19 (×3): qty 50

## 2017-09-19 MED ORDER — METOPROLOL TARTRATE 12.5 MG HALF TABLET: 12.5000 mg | ORAL_TABLET | Freq: Two times a day (BID) | ORAL | Status: DC

## 2017-09-19 MED ORDER — AMIODARONE LOAD VIA INFUSION
150.0000 mg | Freq: Once | INTRAVENOUS | Status: AC
  Administered 2017-09-19: 150 mg via INTRAVENOUS
  Filled 2017-09-19: qty 83.34

## 2017-09-19 MED ORDER — METOPROLOL TARTRATE 25 MG PO TABS
25.0000 mg | ORAL_TABLET | Freq: Two times a day (BID) | ORAL | Status: DC
  Administered 2017-09-19 – 2017-09-22 (×6): 25 mg via ORAL
  Filled 2017-09-19 (×7): qty 1

## 2017-09-19 MED ORDER — AMIODARONE HCL IN DEXTROSE 360-4.14 MG/200ML-% IV SOLN
30.0000 mg/h | INTRAVENOUS | Status: DC
  Administered 2017-09-19: 30 mg/h via INTRAVENOUS
  Filled 2017-09-19: qty 200

## 2017-09-19 MED ORDER — SIMETHICONE 80 MG PO CHEW
80.0000 mg | CHEWABLE_TABLET | Freq: Four times a day (QID) | ORAL | Status: DC | PRN
  Administered 2017-09-19 (×2): 80 mg via ORAL
  Filled 2017-09-19 (×2): qty 1

## 2017-09-19 MED ORDER — AMIODARONE HCL IN DEXTROSE 360-4.14 MG/200ML-% IV SOLN
60.0000 mg/h | INTRAVENOUS | Status: DC
  Administered 2017-09-19 (×2): 60 mg/h via INTRAVENOUS
  Filled 2017-09-19 (×2): qty 200

## 2017-09-19 MED ORDER — AMIODARONE HCL 200 MG PO TABS: 200.0000 mg | ORAL_TABLET | Freq: Every day | ORAL | Status: DC

## 2017-09-19 NOTE — Progress Notes (Signed)
09/19/2017 1130 PCA D/C'd.  8cc's of Fentanyl left in PCA syringe-this was wasted in sink and verified by Anson Crofts RN. [place]

## 2017-09-19 NOTE — Progress Notes (Signed)
Patient ID: Margaret Charles, female   DOB: 1950/12/05, 67 y.o.   MRN: [id number] Discussed path with patient  Cancer Staging Lung cancer, Right lower lobe ([place]) Staging form: Lung, AJCC 8th Edition - Clinical: No stage assigned - Unsigned - Pathologic stage from 09/19/2017: Stage IA2 (pT1b, pN0, cM0) - Signed by [name], MD on 09/19/2017  Chest tube out 1-2 days and poss home Monday, has friends to stay with her  [name] MD      [address] [place] [address] Office [phone]   [place]

## 2017-09-19 NOTE — Progress Notes (Signed)
  Amiodarone Drug - Drug Interaction Consult Note  Recommendations: no changes recommended at this time  Amiodarone is metabolized by the cytochrome P450 system and therefore has the potential to cause many drug interactions. Amiodarone has an average plasma half-life of 50 days (range 20 to 100 days).   There is potential for drug interactions to occur several weeks or months after stopping treatment and the onset of drug interactions may be slow after initiating amiodarone.   []  Statins: Increased risk of myopathy. Simvastatin- restrict dose to 20mg  daily. Other statins: counsel patients to report any muscle pain or weakness immediately.  []  Anticoagulants: Amiodarone can increase anticoagulant effect. Consider warfarin dose reduction. Patients should be monitored closely and the dose of anticoagulant altered accordingly, remembering that amiodarone levels take several weeks to stabilize.  []  Antiepileptics: Amiodarone can increase plasma concentration of phenytoin, the dose should be reduced. Note that small changes in phenytoin dose can result in large changes in levels. Monitor patient and counsel on signs of toxicity.  [x]  Beta blockers: increased risk of bradycardia, AV block and myocardial depression. Sotalol - avoid concomitant use.  []   Calcium channel blockers (diltiazem and verapamil): increased risk of bradycardia, AV block and myocardial depression.  []   Cyclosporine: Amiodarone increases levels of cyclosporine. Reduced dose of cyclosporine is recommended.  []  Digoxin dose should be halved when amiodarone is started.  []  Diuretics: increased risk of cardiotoxicity if hypokalemia occurs.  []  Oral hypoglycemic agents (glyburide, glipizide, glimepiride): increased risk of hypoglycemia. Patient's glucose levels should be monitored closely when initiating amiodarone therapy.   []  Drugs that prolong the QT interval:  Torsades de pointes risk may be increased with concurrent use -  avoid if possible.  Monitor QTc, also keep magnesium/potassium WNL if concurrent therapy can't be avoided. Marland Kitchen Antibiotics: e.g. fluoroquinolones, erythromycin. . Antiarrhythmics: e.g. quinidine, procainamide, disopyramide, sotalol. . Antipsychotics: e.g. phenothiazines, haloperidol.  . Lithium, tricyclic antidepressants, and methadone.   Thank You,  Wayland Salinas  09/19/2017 10:07 AM   Jalyiah Shelley S. [place]ford [place], PharmD, [facility] Clinical Staff Pharmacist Pager [phone]

## 2017-09-19 NOTE — Progress Notes (Signed)
Paged Dr. Prescott Gum on-call for Dr. Servando Snare regarding pt's persistent nausea despite Zofran and Simethicone administration. Order received to restart scheduled IV Reglan. Will administer and continue to monitor.

## 2017-09-19 NOTE — Progress Notes (Addendum)
      [address]       [place] [address]             [phone]      3 Days Post-Op Procedure(s) (LRB): VIDEO BRONCHOSCOPY (N/A) VIDEO ASSISTED THORACOSCOPY (VATS)/LUNG RESECTION (Right)   Subjective:  No new complaints.  Continues to have issues with nausea and GI difficulties.  She states she feels like she needs to belch and cant which is causing her to be uncomfortable.  Small BM yesterday  Objective: Vital signs in last 24 hours: Temp:  [98 F (36.7 C)-98.9 F (37.2 C)] 98.9 F (37.2 C) (03/29 0435) Pulse Rate:  [66-147] 147 (03/29 0435) Cardiac Rhythm: Atrial fibrillation (03/29 0700) Resp:  [13-30] 21 (03/29 0521) BP: (132-153)/(75-92) 152/92 (03/29 0435) SpO2:  [94 %-99 %] 97 % (03/29 0521) FiO2 (%):  [21 %] 21 % (03/29 0033)  Intake/Output from previous day: 03/28 0701 - 03/29 0700 In: 3611 [P.O.:620; I.V.:2891; IV Piggyback:100] Out: 720 [Urine:500; Chest Tube:220]  General appearance: alert, cooperative and no distress Heart: regular rate and rhythm Lungs: clear to auscultation bilaterally Abdomen: soft, non-tender; bowel sounds normal; no masses,  no organomegaly Extremities: extremities normal, atraumatic, no cyanosis or edema Wound: clean and dry'  Lab Results: Recent Labs    09/17/17 0422 09/18/17 0518  WBC 16.0* 15.1*  HGB 10.9* 11.3*  HCT 33.5* 33.7*  PLT 279 234   BMET:  Recent Labs    09/17/17 0422 09/18/17 0518  NA 133* 129*  K 3.5 4.2  CL 103 97*  CO2 23 23  GLUCOSE 122* 104*  BUN 6 6  CREATININE 0.54 0.57  CALCIUM 8.5* 7.7*    PT/INR: No results for input(s): LABPROT, INR in the last 72 hours. ABG    Component Value Date/Time   PHART 7.403 09/17/2017 0430   HCO3 23.6 09/17/2017 0430   ACIDBASEDEF 0.5 09/17/2017 0430   O2SAT 99.5 09/17/2017 0430   CBG (last 3)  No results for input(s): GLUCAP in the last 72 hours.  Assessment/Plan: S/P Procedure(s) (LRB): VIDEO BRONCHOSCOPY (N/A) VIDEO ASSISTED  THORACOSCOPY (VATS)/LUNG RESECTION (Right)  1. Chest tube- 5+ air leak this morning, small lateral pneumothorax post removal of chest tube- on water seal currently, will leave on water seal for now, may benefit from placing chest tubes back on suction. 2. CV- Atrial Fibrillation with RVR this morning- on Amiodarone per protocol, remains Hypertensive will start Lopressor at 25 mg BID 3. GI- persistent nausea, GI discomfort, continue reglan, zofran, will add simethicone per patient request 4. Dispo- patient with new onset rapid A. Fib this morning continue Amiodarone protocol, start Lopressor, will check TSH and Mg level, continue supportive GI care  LOS: 3 days    Margaret Charles 09/19/2017 Path still pending  Afib last night, sinus now  Convert to po amniodrone and beta blocker Chest tube examined, large tidal but very small air leak with cough, leave on water seal and leave chest tube in D/c on q and pca pump I have seen and examined Margaret Charles and agree with the above assessment  and plan.  [name] MD Beeper [phone] Office [phone] 09/19/2017 9:50 AM

## 2017-09-19 NOTE — Progress Notes (Signed)
Pt converted to A-Fib plus HR got to 150's Dr Servando Snare notified N.O Amiodarone order set. Bolus already given presently on a drip VS 132/88 (102) HR 116 at this time. We will continue to monitor.

## 2017-09-20 ENCOUNTER — Inpatient Hospital Stay (HOSPITAL_COMMUNITY): Payer: Medicare Other

## 2017-09-20 LAB — BASIC METABOLIC PANEL
Anion gap: 11 (ref 5–15)
BUN: 6 mg/dL (ref 6–20)
CO2: 22 mmol/L (ref 22–32)
Calcium: 8.5 mg/dL — ABNORMAL LOW (ref 8.9–10.3)
Chloride: 102 mmol/L (ref 101–111)
Creatinine, Ser: 0.58 mg/dL (ref 0.44–1.00)
GFR calc Af Amer: >60 mL/min (ref >60)
GFR calc non Af Amer: >60 mL/min (ref >60)
Glucose, Bld: 103 mg/dL — ABNORMAL HIGH (ref 65–99)
Potassium: 3.4 mmol/L — ABNORMAL LOW (ref 3.5–5.1)
Sodium: 135 mmol/L (ref 135–145)

## 2017-09-20 LAB — CBC
HCT: 33.4 % — ABNORMAL LOW (ref 36.0–46.0)
Hemoglobin: 11.5 g/dL — ABNORMAL LOW (ref 12.0–15.0)
MCH: 30.3 pg (ref 26.0–34.0)
MCHC: 34.4 g/dL (ref 30.0–36.0)
MCV: 87.9 fL (ref 78.0–100.0)
Platelets: 121 10*3/uL — ABNORMAL LOW (ref 150–400)
RBC: 3.8 MIL/uL — ABNORMAL LOW (ref 3.87–5.11)
RDW: 13.9 % (ref 11.5–15.5)
WBC: 10.1 10*3/uL (ref 4.0–10.5)

## 2017-09-20 LAB — MAGNESIUM: MAGNESIUM: 1.8 mg/dL (ref 1.7–2.4)

## 2017-09-20 MED ORDER — POTASSIUM CHLORIDE CRYS ER 20 MEQ PO TBCR
20.0000 meq | EXTENDED_RELEASE_TABLET | Freq: Two times a day (BID) | ORAL | Status: AC
  Administered 2017-09-20 (×2): 20 meq via ORAL
  Filled 2017-09-20 (×2): qty 1

## 2017-09-20 MED ORDER — AMIODARONE HCL 200 MG PO TABS
400.0000 mg | ORAL_TABLET | Freq: Two times a day (BID) | ORAL | Status: DC
  Administered 2017-09-20 – 2017-09-22 (×4): 400 mg via ORAL
  Filled 2017-09-20 (×4): qty 2

## 2017-09-20 MED ORDER — AMIODARONE HCL 200 MG PO TABS: 200.0000 mg | ORAL_TABLET | Freq: Every day | ORAL | Status: DC

## 2017-09-20 MED ORDER — AMIODARONE LOAD VIA INFUSION
150.0000 mg | Freq: Once | INTRAVENOUS | Status: AC
  Administered 2017-09-20: 150 mg via INTRAVENOUS
  Filled 2017-09-20: qty 83.34

## 2017-09-20 MED ORDER — KETOROLAC TROMETHAMINE 15 MG/ML IJ SOLN
15.0000 mg | Freq: Four times a day (QID) | INTRAMUSCULAR | Status: DC | PRN
  Administered 2017-09-20 – 2017-09-21 (×4): 15 mg via INTRAVENOUS
  Filled 2017-09-20 (×4): qty 1

## 2017-09-20 NOTE — Progress Notes (Addendum)
      [address]       [place] [address]             [phone]      4 Days Post-Op Procedure(s) (LRB): VIDEO BRONCHOSCOPY (N/A) VIDEO ASSISTED THORACOSCOPY (VATS)/LUNG RESECTION (Right) Subjective: Having some nausea this morning, but Reglan is helping. Some pain at the chest tube site.   Objective: Vital signs in last 24 hours: Temp:  [97.9 F (36.6 C)-98.4 F (36.9 C)] 98 F (36.7 C) (03/30 0430) Pulse Rate:  [56-129] 129 (03/30 0804) Cardiac Rhythm: Normal sinus rhythm (03/30 0700) Resp:  [16-28] 28 (03/30 0804) BP: (119-151)/(63-111) 140/111 (03/30 0804) SpO2:  [96 %-100 %] 96 % (03/30 0804)     Intake/Output from previous day: 03/29 0701 - 03/30 0700 In: 1330 [P.O.:1320; I.V.:10] Out: 380 [Chest Tube:380] Intake/Output this shift: Total I/O In: 360 [P.O.:360] Out: -   General appearance: alert, cooperative and no distress Heart: irregularly irregular rhythm Lungs: clear to auscultation bilaterally Abdomen: soft, non-tender; bowel sounds normal; no masses,  no organomegaly Extremities: extremities normal, atraumatic, no cyanosis or edema Wound: clean and dry  Lab Results: Recent Labs    09/18/17 0518 09/20/17 0530  WBC 15.1* 10.1  HGB 11.3* 11.5*  HCT 33.7* 33.4*  PLT 234 121*   BMET:  Recent Labs    09/19/17 0830 09/20/17 0530  NA 134* 135  K 2.9* 3.4*  CL 94* 102  CO2 24 22  GLUCOSE 136* 103*  BUN <5* 6  CREATININE 0.51 0.58  CALCIUM 8.1* 8.5*    PT/INR: No results for input(s): LABPROT, INR in the last 72 hours. ABG    Component Value Date/Time   PHART 7.403 09/17/2017 0430   HCO3 23.6 09/17/2017 0430   ACIDBASEDEF 0.5 09/17/2017 0430   O2SAT 99.5 09/17/2017 0430   CBG (last 3)  No results for input(s): GLUCAP in the last 72 hours.  Assessment/Plan: S/P Procedure(s) (LRB): VIDEO BRONCHOSCOPY (N/A) VIDEO ASSISTED THORACOSCOPY (VATS)/LUNG RESECTION (Right)  1. Back in atrial fibrillation rate 100s-120s.  Drip discontinued last night. Will rebolus x1. Also on oral Amio and metoprolol 25mg  BID. BP well controlled.  2. Pulm- Chest tube to water seal with a 1+ air leak. CXR shows unchanged small right apical pneumothorax. Tolerating room air without issue. Encourage incentive spirometer 3. Renal-creatinine 0.58, replace potassium. Will order a mag level.  4. H and H stable.  5. Nausea- Reglan PRN 6. Pain management-added Toradol also on Ultram.   Plan: Rebolus Amio. Monitor rhythm. Replace potassium. Work on pain control and nausea. Keep chest tube.     LOS: 4 days    Elgie Collard 09/20/2017  water seal today- plan DC tube tomorrow Increase amio dose for recurrent afib  patient examined and medical record reviewed,agree with above note. Tharon Aquas Trigt III 09/20/2017

## 2017-09-20 NOTE — Plan of Care (Signed)
  Problem: Pain Management: Goal: Pain level will decrease Outcome: Progressing   

## 2017-09-21 ENCOUNTER — Inpatient Hospital Stay (HOSPITAL_COMMUNITY): Payer: Medicare Other

## 2017-09-21 NOTE — Progress Notes (Addendum)
      [address]       [place] [address]             [phone]      5 Days Post-Op Procedure(s) (LRB): VIDEO BRONCHOSCOPY (N/A) VIDEO ASSISTED THORACOSCOPY (VATS)/LUNG RESECTION (Right) Subjective: No issues overnight. She feels better this morning.   Objective: Vital signs in last 24 hours: Temp:  [98.4 F (36.9 C)] 98.4 F (36.9 C) (03/31 0540) Pulse Rate:  [57-99] 66 (03/31 0052) Cardiac Rhythm: Normal sinus rhythm (03/31 0100) Resp:  [19-21] 21 (03/31 0540) BP: (120-136)/(67-85) 135/76 (03/31 0540) SpO2:  [97 %-100 %] 98 % (03/31 0540)     Intake/Output from previous day: 03/30 0701 - 03/31 0700 In: 610 [P.O.:600; I.V.:10] Out: 250 [Chest Tube:250] Intake/Output this shift: No intake/output data recorded.  General appearance: alert, cooperative and no distress Heart: regular rate and rhythm, S1, S2 normal, no murmur, click, rub or gallop Lungs: clear to auscultation bilaterally Abdomen: soft, non-tender; bowel sounds normal; no masses,  no organomegaly Extremities: extremities normal, atraumatic, no cyanosis or edema Wound: clean and dry  Lab Results: Recent Labs    09/20/17 0530  WBC 10.1  HGB 11.5*  HCT 33.4*  PLT 121*   BMET:  Recent Labs    09/19/17 0830 09/20/17 0530  NA 134* 135  K 2.9* 3.4*  CL 94* 102  CO2 24 22  GLUCOSE 136* 103*  BUN <5* 6  CREATININE 0.51 0.58  CALCIUM 8.1* 8.5*    PT/INR: No results for input(s): LABPROT, INR in the last 72 hours. ABG    Component Value Date/Time   PHART 7.403 09/17/2017 0430   HCO3 23.6 09/17/2017 0430   ACIDBASEDEF 0.5 09/17/2017 0430   O2SAT 99.5 09/17/2017 0430   CBG (last 3)  No results for input(s): GLUCAP in the last 72 hours.  Assessment/Plan: S/P Procedure(s) (LRB): VIDEO BRONCHOSCOPY (N/A) VIDEO ASSISTED THORACOSCOPY (VATS)/LUNG RESECTION (Right)  1. NSR in the 70s. BP well controlled. Tolerating oral Amio and Metoprolol 25mg  BID.  2. Pulm- chest tube to  water seal without an air leak. + fluid wave. CXR stable this morning. Will discontinue chest tube. CXR in the morning.  3. Renal-creatinine 0.58. Potassium replaced. Mag okay at 1.8 4. H and H stable 5. Nausea- resolved. Continue Reglan 6. Pain control- on Toradol and Ultram  Plan: Discontinue chest tube. Continue to monitor rhythm closely. Will keep central line until in NSR for 24 hours then discontinue. Possibly home tomorrow.    LOS: 5 days    Elgie Collard 09/21/2017 Chest x-ray and patient reviewed-chest tube removed today making progress for discharge soon patient examined and medical record reviewed,agree with above note. Tharon Aquas Trigt III 09/21/2017

## 2017-09-21 NOTE — Progress Notes (Signed)
09/21/2017 0930 Chest tube removed.Pttolerated well. Carney Corners

## 2017-09-22 ENCOUNTER — Ambulatory Visit: Payer: Medicare Other | Admitting: Endocrinology

## 2017-09-22 ENCOUNTER — Inpatient Hospital Stay (HOSPITAL_COMMUNITY): Payer: Medicare Other

## 2017-09-22 ENCOUNTER — Other Ambulatory Visit: Payer: Self-pay | Admitting: Cardiothoracic Surgery

## 2017-09-22 DIAGNOSIS — C3431 Malignant neoplasm of lower lobe, right bronchus or lung: Secondary | ICD-10-CM

## 2017-09-22 MED ORDER — METOPROLOL TARTRATE 25 MG PO TABS: 25.0000 mg | ORAL_TABLET | Freq: Two times a day (BID) | ORAL | 3 refills | Status: DC

## 2017-09-22 MED ORDER — ONDANSETRON HCL 4 MG PO TABS: 4.0000 mg | ORAL_TABLET | Freq: Every day | ORAL | 1 refills | Status: DC | PRN

## 2017-09-22 MED ORDER — OXYCODONE HCL 5 MG PO TABS: 5.0000 mg | ORAL_TABLET | ORAL | 0 refills | Status: DC | PRN

## 2017-09-22 MED ORDER — AMIODARONE HCL 200 MG PO TABS: 400.0000 mg | ORAL_TABLET | Freq: Two times a day (BID) | ORAL | 1 refills | Status: DC

## 2017-09-22 NOTE — Progress Notes (Signed)
Pt discharged at 1:05 pm with friend.  Discharge summary reviewed and signed by patient.  Belongings collected and assisted to car by friend at her request.  Did not want w/c assistance.  L hand IV discontinued w/o difficulty.   Anson Crofts RN

## 2017-09-22 NOTE — Progress Notes (Signed)
      [address]       [place] [address]             [phone]      6 Days Post-Op Procedure(s) (LRB): VIDEO BRONCHOSCOPY (N/A) VIDEO ASSISTED THORACOSCOPY (VATS)/LUNG RESECTION (Right)   Subjective:  No new complaints.  She is feeling somewhat better.  Wants to go home today.  Objective: Vital signs in last 24 hours: Temp:  [98.3 F (36.8 C)-98.6 F (37 C)] 98.3 F (36.8 C) (04/01 0515) Pulse Rate:  [64-74] 68 (04/01 0515) Cardiac Rhythm: Normal sinus rhythm (04/01 0430) Resp:  [17-21] 17 (04/01 0515) BP: (120-149)/(62-86) 148/86 (04/01 0515) SpO2:  [98 %-100 %] 98 % (04/01 0515)  Intake/Output from previous day: 03/31 0701 - 04/01 0700 In: 10 [I.V.:10] Out: -   General appearance: alert, cooperative and no distress Heart: regular rate and rhythm Lungs: clear to auscultation bilaterally Abdomen: soft, non-tender; bowel sounds normal; no masses,  no organomegaly Extremities: extremities normal, atraumatic, no cyanosis or edema Wound: clean and dry  Lab Results: Recent Labs    09/20/17 0530  WBC 10.1  HGB 11.5*  HCT 33.4*  PLT 121*   BMET:  Recent Labs    09/19/17 0830 09/20/17 0530  NA 134* 135  K 2.9* 3.4*  CL 94* 102  CO2 24 22  GLUCOSE 136* 103*  BUN <5* 6  CREATININE 0.51 0.58  CALCIUM 8.1* 8.5*    PT/INR: No results for input(s): LABPROT, INR in the last 72 hours. ABG    Component Value Date/Time   PHART 7.403 09/17/2017 0430   HCO3 23.6 09/17/2017 0430   ACIDBASEDEF 0.5 09/17/2017 0430   O2SAT 99.5 09/17/2017 0430   CBG (last 3)  No results for input(s): GLUCAP in the last 72 hours.  Assessment/Plan: S/P Procedure(s) (LRB): VIDEO BRONCHOSCOPY (N/A) VIDEO ASSISTED THORACOSCOPY (VATS)/LUNG RESECTION (Right)  1. Pulm- off oxygen, Pneumothorax is stable on CXR, mild sub q air- patient is asymptomatic 2. CV- PAF, currently NSR- continue Amiodarone, low dose lopressor 3. Dispo- patient stable, d/c central line  today, will review CXR with Dr. Servando Snare and likely d/c patient today   LOS: 6 days    Ellwood Handler 09/22/2017

## 2017-09-22 NOTE — Care Management Note (Signed)
Case Management Note Marvetta Gibbons RN, BSN Unit 4E-Case Manager [phone]  Patient Details  Name: Margaret Charles MRN: [id number] Date of Birth: [birth year]  Subjective/Objective:  Pt admitted s/p VATS                  Action/Plan: PTA Pt lived at home, plan to return home no CM needs noted for transition home. PCP- Helane Rima  Expected Discharge Date:  09/22/17               Expected Discharge Plan:  Home/Self Care  In-House Referral:  NA  Discharge planning Services  CM Consult  Post Acute Care Choice:  NA Choice offered to:  NA  DME Arranged:    DME Agency:     HH Arranged:    HH Agency:     Status of Service:  Completed, signed off  If discussed at [place] of Stay Meetings, dates discussed:    Discharge Disposition: home/self care   Additional Comments:  Dawayne Patricia, RN 09/22/2017, 11:44 AM

## 2017-09-22 NOTE — Op Note (Signed)
NAME:  Margaret Charles, Margaret Charles NO.:  MEDICAL RECORD NO.:  [id number]  LOCATION:                                 FACILITY:  PHYSICIAN:  Lanelle Bal, MD         DATE OF BIRTH:  DATE OF PROCEDURE:  09/16/2017 DATE OF DISCHARGE:                              OPERATIVE REPORT   PREOPERATIVE DIAGNOSIS:  Right lower lobe lung mass.  POSTOPERATIVE DIAGNOSES: 1. Right lower lobe lung mass. 2. Non-small cell carcinoma of the lung.  PROCEDURES PERFORMED: 1. Bronchoscopy. 2. Right video-assisted thoracoscopy. 3. Wedge resection, right lower lobe. 4. Completion right lower lobectomy with lymph node dissection. 5. Placement of On-Q.  SURGEON:  Lanelle Bal, MD  FIRST ASSISTANT:  Ellwood Handler, PA.  BRIEF HISTORY:  The patient is a 67 year old female with a distant smoking history, who presented with cough.  Chest x-ray and led to CT scan and PET scan of the chest, which suggested clinical stage I carcinoma of the lung involving the right lower lobe without mediastinal involvement.  The mass was approximately 1 cm in size.  It was recommended to the patient that we proceed with a primary surgical resection, both for diagnosis and treatment.  Risks and options were discussed with her in detail.  She was agreeable and signed informed consent.  DESCRIPTION OF PROCEDURE:  The patient underwent general endotracheal anesthesia with the double-lumen endotracheal tube.  Appropriate time- out was performed and then we proceeded with bronchoscopy, right and left tracheobronchial tree to the subsegmental level.  The double-lumen endotracheal tube was in good position.  There were no endobronchial lesions noted.  The scope was then removed and the patient was turned in the lateral decubitus position with the right side up.  The right lung was collapsed.  A port site was created in the 4th intercostal and 6th intercostal space.  There was good collapse of the lung.  The  fissures were relatively well preserved.  The site of the tumor was not readily obvious on the surface.  This initial port site was enlarged slightly and 2 additional port sites were placed anterior and posteriorly.  We were able to palpate the lesion and a wedge resection of the right lower lobe was performed.  This was then submitted to Pathology.  Frozen section confirmed non-small cell carcinoma.  We then proceeded with formal right lower lobectomy.  The inferior pulmonary ligament was divided up to the inferior pulmonary veins.  The inferior pulmonary vein was then encircled and dissected to allow the Ethicon vascular stapler to be placed across the vein.  As noted, the fissures were relatively well developed.  The right lower lobe pulmonary artery was identified and gently dissected.  This allowed placement of a vascular stapler across the lower lobe pulmonary artery which was divided.  The fissures were completed and the right lower lobe bronchus was encircled.  A green load powered stapler was then used to cross the lower lobe bronchus. Inflation of the upper and middle lobe was confirmed, and the bronchus was stapled and divided.  The specimen was placed in a large specimen bag and brought out through  the small incision.  We then proceeded with lymph node dissection systematically along the inferior pulmonary ligament full, hilum and additional nodes in the superior mediastinum each were labeled per their anatomic site.  We tested the bronchial stump without evidence of air leak.  ProGEL was placed on some very minor areas of bubbling from the middle lobe.  On-Q catheter was tunneled subpleurally along the posterior ribs.  One 28 Arg[place] was placed anteriorly and a Blake drain placed posteriorly.  The lung reinflated nicely.  The incision was closed with interrupted 0 Vicryl in the deep muscle layer, running 2-0 Vicryl for the subcutaneous tissue, 3- 0 subcuticular stitch in skin  edges.  Dermabond was applied too.  At the completion of procedure, there was no obvious air leak.  Estimated blood loss was 50 mL.  Sponge and needle count was reported as correct at the completion of procedure.  The patient tolerated the procedure without obvious complication and was extubated in the operating room and transferred to recovery room for further postoperative care.  Final path on bronchial and vascular margins on the completed lobectomy were negative for tumor.     Lanelle Bal, MD     EG/MEDQ  D:  09/22/2017  T:  09/22/2017  Job:  [id number]

## 2017-09-22 NOTE — Care Management Important Message (Signed)
Important Message  Patient Details  Name: Margaret Charles MRN: [id number] Date of Birth: [birth year]   Medicare Important Message Given:  Yes    Orbie Pyo 09/22/2017, 2:45 PM

## 2017-09-22 NOTE — Progress Notes (Signed)
Right IJ d/c'd at 1045am w/o difficulty.  Catheter intact.  Pressure dressing applied.   Anson Crofts, RN

## 2017-09-23 ENCOUNTER — Ambulatory Visit (INDEPENDENT_AMBULATORY_CARE_PROVIDER_SITE_OTHER): Payer: Self-pay

## 2017-09-23 ENCOUNTER — Ambulatory Visit
Admission: RE | Admit: 2017-09-23 | Discharge: 2017-09-23 | Disposition: A | Discharge status: Home / Self Care | Payer: Medicare Other | Source: Ambulatory Visit | Attending: Cardiothoracic Surgery | Admitting: Cardiothoracic Surgery

## 2017-09-23 ENCOUNTER — Other Ambulatory Visit: Payer: Self-pay

## 2017-09-23 DIAGNOSIS — C3431 Malignant neoplasm of lower lobe, right bronchus or lung: Secondary | ICD-10-CM

## 2017-09-23 DIAGNOSIS — Z4889 Encounter for other specified surgical aftercare: Secondary | ICD-10-CM

## 2017-09-23 NOTE — Progress Notes (Signed)
Patient arrived for a wound check and chest xray, s/p VATS and lung resection 09/16/2017.  Incision sites healing nicely with chest tube sutures still intact.  Junie Panning, [place] stated that xray looked to be same as xray taken yesterday and patient was okayed to leave with making sure patient uses incentive spirometer.  Advised patient of xray and use of incentive spirometer.  Undressed incision sites and left them open to air.  She has a small skin tear where the tape was that was covered with a band-aid and told to remove tomorrow when she showered.  She looked to have a skin irritation to the paper tape that was placed on the incision site, so no tape was used.  Patient acknowledged receipt and is to be brought back for suture removal within the next week.

## 2017-09-25 ENCOUNTER — Other Ambulatory Visit: Payer: Self-pay | Admitting: *Deleted

## 2017-10-02 ENCOUNTER — Other Ambulatory Visit: Payer: Self-pay | Admitting: Cardiothoracic Surgery

## 2017-10-02 DIAGNOSIS — C3430 Malignant neoplasm of lower lobe, unspecified bronchus or lung: Secondary | ICD-10-CM

## 2017-10-02 DIAGNOSIS — C3490 Malignant neoplasm of unspecified part of unspecified bronchus or lung: Secondary | ICD-10-CM

## 2017-10-06 ENCOUNTER — Ambulatory Visit (INDEPENDENT_AMBULATORY_CARE_PROVIDER_SITE_OTHER): Payer: Self-pay | Admitting: Physician Assistant

## 2017-10-06 ENCOUNTER — Ambulatory Visit
Admission: RE | Admit: 2017-10-06 | Discharge: 2017-10-06 | Disposition: A | Discharge status: Home / Self Care | Payer: Medicare Other | Source: Ambulatory Visit | Attending: Cardiothoracic Surgery | Admitting: Cardiothoracic Surgery

## 2017-10-06 ENCOUNTER — Encounter: Payer: Self-pay | Admitting: *Deleted

## 2017-10-06 ENCOUNTER — Other Ambulatory Visit: Payer: Self-pay

## 2017-10-06 VITALS — BP 150/84 | HR 63 | Resp 18 | Ht 67.0 in | Wt 142.4 lb

## 2017-10-06 DIAGNOSIS — Z902 Acquired absence of lung [part of]: Secondary | ICD-10-CM

## 2017-10-06 DIAGNOSIS — C3430 Malignant neoplasm of lower lobe, unspecified bronchus or lung: Secondary | ICD-10-CM

## 2017-10-06 DIAGNOSIS — C3490 Malignant neoplasm of unspecified part of unspecified bronchus or lung: Secondary | ICD-10-CM

## 2017-10-06 MED ORDER — TRAMADOL HCL 50 MG PO TABS: 50.0000 mg | ORAL_TABLET | Freq: Two times a day (BID) | ORAL | 0 refills | Status: AC | PRN

## 2017-10-06 NOTE — Progress Notes (Signed)
Oncology Nurse Navigator Documentation  Oncology Nurse Navigator Flowsheets 10/06/2017  Navigator Location [facility]  Navigator Encounter Type Other/I received a follow up referral on Ms. Begue.  I updated new patient coordinator to call and schedule Ms. Gibeau to be seen with Dr. Julien Nordmann.    Barriers/Navigation Needs Coordination of Care  Interventions Coordination of Care  Coordination of Care Other  Acuity Level 2  Time Spent with Patient 15

## 2017-10-06 NOTE — Progress Notes (Signed)
[address]       [place] [address]             [phone]      Margaret Charles is a 67 y.o. female patient underwent VATs with a right lung resection. Her hospital course was complicated by atrial fibrillation. She is currently on Amio and Metoprolol. Today, she is in NSR.   1. Malignant neoplasm of lower lobe of lung, unspecified laterality ([place])   2. S/P lobectomy of lung    Past Medical History:  Diagnosis Date  . Arthritis    ddd  . Cancer ([place])    skin basal cell cancer  . Chronic headaches   . Emphysema of lung ([place])   . Palpitations   . RBBB (right bundle branch block)    palpitations  . Seizures ([place])    1980s few ??? seizure activity  (vagal response)   No past surgical history pertinent negatives on file. Scheduled Meds: Current Outpatient Medications on File Prior to Visit  Medication Sig Dispense Refill  . amiodarone (PACERONE) 200 MG tablet Take 2 tablets (400 mg total) by mouth 2 (two) times daily. For 5 days, then decrease to 200 mg daily 60 tablet 1  . ibuprofen (ADVIL,MOTRIN) 200 MG tablet Take 600-800 mg by mouth every 8 (eight) hours as needed for headache, mild pain or moderate pain.     . metoprolol tartrate (LOPRESSOR) 25 MG tablet Take 1 tablet (25 mg total) by mouth 2 (two) times daily. 60 tablet 3  . Multiple Vitamins-Minerals (CENTRUM ADULTS PO) Take 1 tablet by mouth daily.    Marland Kitchen OVER THE COUNTER MEDICATION Take 1 tablet by mouth daily. Chamomile 100mg  vegetarian tablet- 1 tab daily.     Marland Kitchen Propylene Glycol-Glycerin (SOOTHE OP) Place 1 drop into both eyes 2 (two) times daily as needed (dry eyes).     No current facility-administered medications on file prior to visit.   :  Allergies  Allergen Reactions  . Codeine Nausea And Vomiting   Pulse 63, resp. rate 18, height 5\' 7"  (1.702 m), weight 142 lb 6.4 oz (64.6 kg), SpO2 97 %.  Subjective  Feels good today. No palpitations or shortness of breath.   Objective  Cor:RRR,  no murmur Pulm:CTA bilaterally in all fields Abd:non-tender Ext: no edema Wound: clean and dry  CLINICAL DATA:  Recent VATS procedure for lung carcinoma  EXAM: CHEST - 2 VIEW  COMPARISON:  September 23, 2017  FINDINGS: There is no longer appreciable subcutaneous air on the right. Previously noted right-sided pneumothorax is no longer appreciable. There is a right pleural effusion with atelectatic change in the right base. Lungs elsewhere are clear. Heart size and pulmonary vascularity are normal. No adenopathy. No bone lesions.   IMPRESSION: Right pleural effusion with right base atelectasis. No edema or consolidation evident. Interval resolution of right-sided pneumothorax and right subcutaneous air. Stable cardiac silhouette.   Electronically Signed   By: Margaret Charles M.D.   On: 10/06/2017 13:02   Assessment & Plan   Margaret Charles is a 67 year old female who is status post VATS with wedge resection on September 16, 2017.  Overall she is recovering very well from surgery.  She is able to do light work at home and is not requiring any more narcotic pain medication.  I did however, give her a prescription for Ultram today since she is still having quite a bit of pain at night which is inhibiting her  sleep.  She has been limiting her weight with her upper extremity from 5-10 pounds. No water aerobics yet, but she can do as much walking as she can tolerate.  I did discuss providing a referral for pulmonary rehab however, she is seeing Margaret Charles next week and wishes to discuss this with him.  She is to gradually increase her activity level as able.  She can now drive since she is no longer requiring narcotic pain medication during the day.  I did review her chest x-ray with her today which showed a small right pleural effusion with bilateral atelectasis.  She does not have any pneumothorax on chest x-ray today.  We also went over her pathology report and I printed her a copy to  take to her oncologist.  It seems that she was set up to see Margaret Charles preoperatively, therefore we sent an official referral today.  She is set up to see Margaret Charles in June for her cardiology needs.  She is in normal sinus rhythm today but does have a history of atrial fibrillation and is still on amiodarone daily.  Her blood pressure was slightly elevated today however she has been taking her blood pressures at home and they have been well controlled.  She remains on metoprolol 25 mg twice daily.  Her incision is healing well and I removed her chest tube sutures today.  She had no further questions at this time.  She is to see Margaret Charles in a week for examination.  She may call our office with questions or concerns.   Margaret Charles 10/06/2017

## 2017-10-06 NOTE — Patient Instructions (Addendum)
You are encouraged to enroll and participate in the outpatient cardiac rehab program beginning as soon as practical.  You may return to driving an automobile as long as you are no longer requiring oral narcotic pain relievers during the daytime.  It would be wise to start driving only short distances during the daylight and gradually increase from there as you feel comfortable.  Make every effort to stay physically active, get some type of exercise on a regular basis, and stick to a "heart healthy diet".  The long term benefits for regular exercise and a healthy diet are critically important to your overall health and wellbeing.  F/u with Dr. Servando Snare F/u with Dr. Acie Fredrickson F/u with Dr. Inda Merlin  F/u with [facility]

## 2017-10-07 ENCOUNTER — Encounter: Payer: Self-pay | Admitting: Internal Medicine

## 2017-10-07 ENCOUNTER — Ambulatory Visit: Payer: Medicare Other | Admitting: Endocrinology

## 2017-10-07 ENCOUNTER — Encounter: Payer: Self-pay | Admitting: Endocrinology

## 2017-10-07 DIAGNOSIS — E041 Nontoxic single thyroid nodule: Secondary | ICD-10-CM | POA: Diagnosis not present

## 2017-10-07 NOTE — Patient Instructions (Signed)
Let's check the ultrasound.  you will receive a phone call, about a day and time for an appointment. Based on the result, you may need to come back here for the biopsy, or come back for a follow-up appointment in 6-12 months.

## 2017-10-07 NOTE — Progress Notes (Signed)
Subjective:    Patient ID: Margaret Charles, female    DOB: 02-06-1951, 67 y.o.   MRN: [id number]  HPI Pt is referred by Dr Lavone Neri, for thyroid nodule.  Pt was noted to have a nodule at the thyroid in early 2019.  she is unaware of ever having had thyroid problems in the past.  she has no h/o XRT or surgery to the neck.  She has slight dry-quality cough in the chest, and assoc pain. Past Medical History:  Diagnosis Date  . Arthritis    ddd  . Cancer ([place])    skin basal cell cancer  . Chronic headaches   . Emphysema of lung ([place])   . Palpitations   . RBBB (right bundle branch block)    palpitations  . Seizures ([place])    1980s few ??? seizure activity  (vagal response)    Past Surgical History:  Procedure Laterality Date  . BLADDER SURGERY     used small int to build bladder  . CATARACT EXTRACTION W/ INTRAOCULAR LENS  IMPLANT, BILATERAL    . COLON SURGERY     resection small intestine from blockage(x2)  . obstructions     x2 bowel obstruction surgeries  . VIDEO ASSISTED THORACOSCOPY (VATS)/WEDGE RESECTION Right 09/16/2017   Procedure: VIDEO ASSISTED THORACOSCOPY (VATS)/LUNG RESECTION;  Surgeon: [name], MD;  Location: [facility];  Service: Thoracic;  Laterality: Right;  Marland Kitchen VIDEO BRONCHOSCOPY N/A 09/16/2017   Procedure: VIDEO BRONCHOSCOPY;  Surgeon: [name], MD;  Location: [facility] OR;  Service: Thoracic;  Laterality: N/A;  . WRIST SURGERY      Social History   Socioeconomic History  . Marital status: Single    Spouse name: Not on file  . Number of children: Not on file  . Years of education: Not on file  . Highest education level: Not on file  Occupational History  . Not on file  Social Needs  . Financial resource strain: Not on file  . Food insecurity:    Worry: Not on file    Inability: Not on file  . Transportation needs:    Medical: Not on file    Non-medical: Not on file  Tobacco Use  . Smoking status: Former Smoker    Packs/day: 1.00    Years:  40.00    Pack years: 40.00    Types: Cigarettes    Last attempt to quit: 12/03/2008    Years since quitting: 8.8  . Smokeless tobacco: Never Used  Substance and Sexual Activity  . Alcohol use: Not Currently    Comment: hx  etoh  38 yrs  . Drug use: Never  . Sexual activity: Not on file  Lifestyle  . Physical activity:    Days per week: Not on file    Minutes per session: Not on file  . Stress: Not on file  Relationships  . Social connections:    Talks on phone: Not on file    Gets together: Not on file    Attends religious service: Not on file    Active member of club or organization: Not on file    Attends meetings of clubs or organizations: Not on file    Relationship status: Not on file  . Intimate partner violence:    Fear of current or ex partner: Not on file    Emotionally abused: Not on file    Physically abused: Not on file    Forced sexual activity: Not on file  Other Topics Concern  .  Not on file  Social History Narrative  . Not on file    Current Outpatient Medications on File Prior to Visit  Medication Sig Dispense Refill  . amiodarone (PACERONE) 200 MG tablet Take 2 tablets (400 mg total) by mouth 2 (two) times daily. For 5 days, then decrease to 200 mg daily 60 tablet 1  . ibuprofen (ADVIL,MOTRIN) 200 MG tablet Take 600-800 mg by mouth every 8 (eight) hours as needed for headache, mild pain or moderate pain.     . metoprolol tartrate (LOPRESSOR) 25 MG tablet Take 1 tablet (25 mg total) by mouth 2 (two) times daily. 60 tablet 3  . Multiple Vitamins-Minerals (CENTRUM ADULTS PO) Take 1 tablet by mouth daily.    Marland Kitchen OVER THE COUNTER MEDICATION Take 1 tablet by mouth daily. Chamomile 100mg  vegetarian tablet- 1 tab daily.     Marland Kitchen Propylene Glycol-Glycerin (SOOTHE OP) Place 1 drop into both eyes 2 (two) times daily as needed (dry eyes).    . traMADol (ULTRAM) 50 MG tablet Take 1 tablet (50 mg total) by mouth every 12 (twelve) hours as needed for up to 5 days. 10 tablet 0     No current facility-administered medications on file prior to visit.     Allergies  Allergen Reactions  . Codeine Nausea And Vomiting    Family History  Problem Relation Age of Onset  . Dementia Mother   . Prostate cancer Father   . Breast cancer Sister   . CAD Sister   . Thyroid disease Neg Hx     BP (!) 148/78   Pulse 67   Resp 16   Wt 142 lb 4 oz (64.5 kg)   BMI 22.28 kg/m    Review of Systems Denies weight change, hoarseness, neck pain, visual loss, dysphagia, diarrhea, itching, flushing, easy bruising, depression, cold intolerance, headache, numbness, and rhinorrhea.  Mild sob is improving.     Objective:   Physical Exam VS: see vs page GEN: no distress HEAD: head: no deformity eyes: no periorbital swelling, no proptosis external nose and ears are normal mouth: no lesion seen NECK: left thyroid nodule, 1-2 cm is palpable CHEST WALL: no deformity.  Right sided healing surgical scar.   LUNGS: clear to auscultation, except for decreased BS on the right CV: reg rate and rhythm, no murmur ABD: abdomen is soft, nontender.  no hepatosplenomegaly.  not distended.  no hernia MUSCULOSKELETAL: muscle bulk and strength are grossly normal.  no obvious joint swelling.  gait is normal and steady EXTEMITIES: no deformity.  no edema PULSES: no carotid bruit.  NEURO:  cn 2-12 grossly intact.   readily moves all 4's.  sensation is intact to touch on all 4's SKIN:  Normal texture and temperature.  No rash or suspicious lesion is visible.   NODES:  None palpable at the neck. PSYCH: alert, well-oriented.  Does not appear anxious nor depressed.   Lab Results  Component Value Date   TSH 4.392 09/19/2017   PET CT:  hypermetabolic left thyroid lobe nodule is seen which measures approximately 1.3 cm and has SUV max of 6.5.  INVASIVE SQUAMOUS CELL CARCINOMA, 1.0 CM  I have reviewed outside records, and summarized: Pt was noted to have thyroid nodule, and referred here.  She  developed AF during hospitalization, and was rx'ed amiodarone.       Assessment & Plan:  Thyroid nodule, new, uncertain etiology URI sxs, improving--not thyroid-related. SCCA of the lung: I told pt this is not thyroid-related.  AF, new: she'll need TFT monitoring on amiodarone  Patient Instructions  Let's check the ultrasound.  you will receive a phone call, about a day and time for an appointment. Based on the result, you may need to come back here for the biopsy, or come back for a follow-up appointment in 6-12 months.   '

## 2017-10-13 ENCOUNTER — Telehealth: Payer: Self-pay

## 2017-10-13 NOTE — Telephone Encounter (Signed)
Patient called to verify upcoming appointment in May as a new patient of [facility]. Per 4/22 phone que

## 2017-10-16 ENCOUNTER — Ambulatory Visit
Admission: RE | Admit: 2017-10-16 | Discharge: 2017-10-16 | Disposition: A | Discharge status: Home / Self Care | Payer: Medicare Other | Source: Ambulatory Visit | Attending: Endocrinology | Admitting: Endocrinology

## 2017-10-16 ENCOUNTER — Other Ambulatory Visit: Payer: Self-pay

## 2017-10-16 ENCOUNTER — Ambulatory Visit (INDEPENDENT_AMBULATORY_CARE_PROVIDER_SITE_OTHER): Payer: Self-pay | Admitting: Cardiothoracic Surgery

## 2017-10-16 VITALS — BP 115/76 | HR 65 | Resp 16 | Ht 67.0 in | Wt 139.0 lb

## 2017-10-16 DIAGNOSIS — Z902 Acquired absence of lung [part of]: Secondary | ICD-10-CM

## 2017-10-16 DIAGNOSIS — E041 Nontoxic single thyroid nodule: Secondary | ICD-10-CM

## 2017-10-16 DIAGNOSIS — C3431 Malignant neoplasm of lower lobe, right bronchus or lung: Secondary | ICD-10-CM

## 2017-10-16 NOTE — Progress Notes (Signed)
[address]       [place] [address]             [phone]                  Margaret Charles [facility] Medical Record #[id number] Date of Birth: [birth year]  Referring QM:[id number], Ronie Spies, MD Primary Cardiology: Primary Care:Helane Rima, MD  Chief Complaint:  Follow Up Visit 09/16/2017  OPERATIVE REPORT PREOPERATIVE DIAGNOSIS:  Right lower lobe lung mass. POSTOPERATIVE DIAGNOSES: 1. Right lower lobe lung mass. 2. Non-small cell carcinoma of the lung. PROCEDURES PERFORMED: 1. Bronchoscopy. 2. Right video-assisted thoracoscopy. 3. Wedge resection, right lower lobe. 4. Completion right lower lobectomy with lymph node dissection. 5. Placement of On-Q. SURGEON:  Lanelle Bal, MD   Cancer Staging Lung cancer, Right lower lobe [facility]) Staging form: Lung, AJCC 8th Edition - Clinical: No stage assigned - Unsigned - Pathologic stage from 09/19/2017: Stage IA2 (pT1b, pN0, cM0) - Signed by [name], MD on 09/19/2017   History of Present Illness:     Patient doing well postoperatively some right chest discomfort, denies shortness of breath.  She is back up to walking more than a mile a day is not requiring any pain medication.  She is having her thyroid ultrasound later today,.   She has a follow-up cardiology appointment for brief postoperative atrial fibrillation, currently on amiodarone  Zubrod Score: At the time of surgery this patient's most appropriate activity status/level should be described as: [x]     0    Normal activity, no symptoms []     1    Restricted in physical strenuous activity but ambulatory, able to do out light work []     2    Ambulatory and capable of self care, unable to do work activities, up and about                 >50 % of waking hours                                                                                   []     3    Only limited self care, in bed greater than 50% of waking hours []     4    Completely  disabled, no self care, confined to bed or chair []     5    Moribund  Social History   Tobacco Use  Smoking Status Former Smoker  . Packs/day: 1.00  . Years: 40.00  . Pack years: 40.00  . Types: Cigarettes  . Last attempt to quit: 12/03/2008  . Years since quitting: 8.8  Smokeless Tobacco Never Used       Allergies  Allergen Reactions  . Codeine Nausea And Vomiting    Current Outpatient Medications  Medication Sig Dispense Refill  . acetaminophen (TYLENOL) 500 MG tablet Take 500 mg by mouth every 6 (six) hours as needed.    Marland Kitchen amiodarone (PACERONE) 200 MG tablet Take 2 tablets (400 mg total) by mouth 2 (two) times daily. For 5 days, then decrease to 200 mg daily 60 tablet 1  . ibuprofen (ADVIL,MOTRIN) 200 MG tablet  Take 600-800 mg by mouth every 8 (eight) hours as needed for headache, mild pain or moderate pain.     . metoprolol tartrate (LOPRESSOR) 25 MG tablet Take 1 tablet (25 mg total) by mouth 2 (two) times daily. 60 tablet 3  . Multiple Vitamins-Minerals (CENTRUM ADULTS PO) Take 1 tablet by mouth daily.    Marland Kitchen OVER THE COUNTER MEDICATION Take 1 tablet by mouth daily. Chamomile 100mg  vegetarian tablet- 1 tab daily.     Marland Kitchen Propylene Glycol-Glycerin (SOOTHE OP) Place 1 drop into both eyes 2 (two) times daily as needed (dry eyes).    . traMADol (ULTRAM) 50 MG tablet Take by mouth every 6 (six) hours as needed.     No current facility-administered medications for this visit.        Physical Exam: BP 115/76 (BP Location: Right Arm, Patient Position: Sitting, Cuff Size: Normal)   Pulse 65   Resp 16   Ht 5\' 7"  (1.702 m)   Wt 139 lb (63 kg)   SpO2 95% Comment: ON RA  BMI 21.77 kg/m   General appearance: alert and cooperative Neurologic: intact Heart: regular rate and rhythm, S1, S2 normal, no murmur, click, rub or gallop Lungs: clear to auscultation bilaterally Abdomen: soft, non-tender; bowel sounds normal; no masses,  no organomegaly Extremities: extremities normal,  atraumatic, no cyanosis or edema and Homans sign is negative, no sign of DVT Wounds: Right chest incision chest tube sites are well-healed  Diagnostic Studies & Laboratory data:         Recent Radiology Findings: No results found.    I have independently reviewed the above radiology findings and reviewed findings  with the patient.  Recent Labs: Lab Results  Component Value Date   WBC 10.1 09/20/2017   HGB 11.5 (L) 09/20/2017   HCT 33.4 (L) 09/20/2017   PLT 121 (L) 09/20/2017   GLUCOSE 103 (H) 09/20/2017   ALT 25 09/19/2017   AST 34 09/19/2017   NA 135 09/20/2017   K 3.4 (L) 09/20/2017   CL 102 09/20/2017   CREATININE 0.58 09/20/2017   BUN 6 09/20/2017   CO2 22 09/20/2017   TSH 4.392 09/19/2017   INR 0.97 09/11/2017      Assessment / Plan:      Patient now doing well approximately 1 month following right lower lobectomy for non-small cell carcinoma of the lung pathologically stage I A2 Plan see the patient back with a chest x-ray in 3 months, and follow-up CT of the chest 6 months postop    [name] 10/16/2017 1:02 PM

## 2017-11-03 ENCOUNTER — Inpatient Hospital Stay: Payer: Medicare Other

## 2017-11-03 ENCOUNTER — Encounter: Payer: Self-pay | Admitting: Internal Medicine

## 2017-11-03 ENCOUNTER — Inpatient Hospital Stay: Payer: Medicare Other | Attending: Internal Medicine | Admitting: Internal Medicine

## 2017-11-03 ENCOUNTER — Telehealth: Payer: Self-pay | Admitting: Internal Medicine

## 2017-11-03 ENCOUNTER — Other Ambulatory Visit: Payer: Self-pay | Admitting: Medical Oncology

## 2017-11-03 VITALS — BP 139/85 | HR 62 | Temp 98.6°F | Resp 18 | Ht 67.0 in | Wt 141.0 lb

## 2017-11-03 DIAGNOSIS — Z803 Family history of malignant neoplasm of breast: Secondary | ICD-10-CM | POA: Diagnosis not present

## 2017-11-03 DIAGNOSIS — C3432 Malignant neoplasm of lower lobe, left bronchus or lung: Secondary | ICD-10-CM | POA: Diagnosis not present

## 2017-11-03 DIAGNOSIS — Z8042 Family history of malignant neoplasm of prostate: Secondary | ICD-10-CM | POA: Diagnosis not present

## 2017-11-03 DIAGNOSIS — Z85828 Personal history of other malignant neoplasm of skin: Secondary | ICD-10-CM

## 2017-11-03 DIAGNOSIS — Z902 Acquired absence of lung [part of]: Secondary | ICD-10-CM

## 2017-11-03 DIAGNOSIS — M5030 Other cervical disc degeneration, unspecified cervical region: Secondary | ICD-10-CM

## 2017-11-03 DIAGNOSIS — C3431 Malignant neoplasm of lower lobe, right bronchus or lung: Secondary | ICD-10-CM

## 2017-11-03 DIAGNOSIS — C3490 Malignant neoplasm of unspecified part of unspecified bronchus or lung: Secondary | ICD-10-CM

## 2017-11-03 DIAGNOSIS — J449 Chronic obstructive pulmonary disease, unspecified: Secondary | ICD-10-CM | POA: Insufficient documentation

## 2017-11-03 DIAGNOSIS — C3430 Malignant neoplasm of lower lobe, unspecified bronchus or lung: Secondary | ICD-10-CM

## 2017-11-03 DIAGNOSIS — Z87891 Personal history of nicotine dependence: Secondary | ICD-10-CM | POA: Diagnosis not present

## 2017-11-03 LAB — CMP (CANCER CENTER ONLY)
ALBUMIN: 3.8 g/dL (ref 3.5–5.0)
ALK PHOS: 111 U/L (ref 40–150)
ALT: 19 U/L (ref 0–55)
AST: 16 U/L (ref 5–34)
Anion gap: 7 (ref 3–11)
BUN: 16 mg/dL (ref 7–26)
CHLORIDE: 105 mmol/L (ref 98–109)
CO2: 25 mmol/L (ref 22–29)
Calcium: 9.5 mg/dL (ref 8.4–10.4)
Creatinine: 0.85 mg/dL (ref 0.60–1.10)
GFR, Est AFR Am: >60 mL/min (ref >60)
GFR, Estimated: >60 mL/min (ref >60)
GLUCOSE: 118 mg/dL (ref 70–140)
Potassium: 4.5 mmol/L (ref 3.5–5.1)
SODIUM: 137 mmol/L (ref 136–145)
Total Bilirubin: 0.4 mg/dL (ref 0.2–1.2)
Total Protein: 7 g/dL (ref 6.4–8.3)

## 2017-11-03 LAB — CBC WITH DIFFERENTIAL (CANCER CENTER ONLY)
BASOS PCT: 0 %
Basophils Absolute: 0 10*3/uL (ref 0.0–0.1)
EOS ABS: 0.1 10*3/uL (ref 0.0–0.5)
EOS PCT: 1 %
HCT: 37.6 % (ref 34.8–46.6)
HEMOGLOBIN: 12.7 g/dL (ref 11.6–15.9)
LYMPHS ABS: 2.2 10*3/uL (ref 0.9–3.3)
Lymphocytes Relative: 22 %
MCH: 30.8 pg (ref 25.1–34.0)
MCHC: 33.8 g/dL (ref 31.5–36.0)
MCV: 91.3 fL (ref 79.5–101.0)
MONOS PCT: 10 %
Monocytes Absolute: 1.1 10*3/uL — ABNORMAL HIGH (ref 0.1–0.9)
NEUTROS PCT: 67 %
Neutro Abs: 6.9 10*3/uL — ABNORMAL HIGH (ref 1.5–6.5)
PLATELETS: 303 10*3/uL (ref 145–400)
RBC: 4.12 MIL/uL (ref 3.70–5.45)
RDW: 13.6 % (ref 11.2–14.5)
WBC: 10.3 10*3/uL (ref 3.9–10.3)

## 2017-11-03 NOTE — Progress Notes (Signed)
[facility] Telephone:([phone]   [phone]  CONSULT NOTE  REFERRING PHYSICIAN: Dr. Lanelle Bal  REASON FOR CONSULTATION:  67 years old white female recently diagnosed with lung cancer.  HPI Margaret Charles is a 67 y.o. female with past medical history significant for COPD, degenerative disc disease, basal cell carcinoma of the nose and back as well as history of seizure 1980s.  The patient also has a long history of smoking but quit 9 years ago.  She is very active on online support group and during the discussion she had information about CT screening for patient with long history for smoking.  She was seen by her primary care physician and requested CT screening.  She had CT scan of the chest performed on 08/06/2017 and it showed suspicious right lower lobe pulmonary nodule measuring 1.4 cm.  The patient had a PET scan on 08/25/2017 and it showed 1.3 cm hypermetabolic right lower lobe pulmonary nodule.  There was no nodal or distant metastatic disease.  The patient was then referred to Dr. Servando Snare. On September 16, 2017 she underwent bronchoscopy, right VATS with right lower lobectomy with lymph node dissection under the care of Dr. Servando Snare. The final pathology ([id number]) showed invasive squamous cell carcinoma measuring 1.0 cm.  The resection margins and the dissected lymph nodes were negative for malignancy. Dr. Servando Snare kindly referred the patient to me today for evaluation and recommendation regarding her condition. When seen today she is feeling fine except for mild soreness in the right side of the chest after the surgical procedure.  She denied having any significant cough, shortness of breath or hemoptysis.  She denied having any recent weight loss or night sweats.  She has no nausea, vomiting, diarrhea or constipation.  She denied having any headache or visual changes. Family history significant for mother with dementia, father had prostate cancer and  sister had breast cancer. The patient is single and has no children.  She used to work as a Pharmacist, hospital.  She has a history of smoking 1 pack/day for around 40 years and quit 9 years ago.  She has no history of alcohol or drug abuse.  HPI  Past Medical History:  Diagnosis Date  . Arthritis    ddd  . Cancer ([place])    skin basal cell cancer  . Chronic headaches   . Emphysema of lung ([place])   . Palpitations   . RBBB (right bundle branch block)    palpitations  . Seizures ([place])    1980s few ??? seizure activity  (vagal response)    Past Surgical History:  Procedure Laterality Date  . BLADDER SURGERY     used small int to build bladder  . CATARACT EXTRACTION W/ INTRAOCULAR LENS  IMPLANT, BILATERAL    . COLON SURGERY     resection small intestine from blockage(x2)  . obstructions     x2 bowel obstruction surgeries  . VIDEO ASSISTED THORACOSCOPY (VATS)/WEDGE RESECTION Right 09/16/2017   Procedure: VIDEO ASSISTED THORACOSCOPY (VATS)/LUNG RESECTION;  Surgeon: [name], MD;  Location: [facility];  Service: Thoracic;  Laterality: Right;  Marland Kitchen VIDEO BRONCHOSCOPY N/A 09/16/2017   Procedure: VIDEO BRONCHOSCOPY;  Surgeon: [name], MD;  Location[facility];  Service: Thoracic;  Laterality: N/A;  . WRIST SURGERY      Family History  Problem Relation Age of Onset  . Dementia Mother   . Prostate cancer Father   . Breast cancer Sister   . CAD  Sister   . Thyroid disease Neg Hx     Social History Social History   Tobacco Use  . Smoking status: Former Smoker    Packs/day: 1.00    Years: 40.00    Pack years: 40.00    Types: Cigarettes    Last attempt to quit: 12/03/2008    Years since quitting: 8.9  . Smokeless tobacco: Never Used  Substance Use Topics  . Alcohol use: Not Currently    Comment: hx  etoh  38 yrs  . Drug use: Never    Allergies  Allergen Reactions  . Codeine Nausea And Vomiting    Current Outpatient Medications  Medication Sig Dispense Refill  . amiodarone  (PACERONE) 200 MG tablet Take 2 tablets (400 mg total) by mouth 2 (two) times daily. For 5 days, then decrease to 200 mg daily 60 tablet 1  . ibuprofen (ADVIL,MOTRIN) 200 MG tablet Take 600-800 mg by mouth every 8 (eight) hours as needed for headache, mild pain or moderate pain.     . metoprolol tartrate (LOPRESSOR) 25 MG tablet Take 1 tablet (25 mg total) by mouth 2 (two) times daily. 60 tablet 3  . Multiple Vitamins-Minerals (CENTRUM ADULTS PO) Take 1 tablet by mouth daily.    Marland Kitchen Propylene Glycol-Glycerin (SOOTHE OP) Place 1 drop into both eyes 2 (two) times daily as needed (dry eyes).    Marland Kitchen acetaminophen (TYLENOL) 500 MG tablet Take 500 mg by mouth every 6 (six) hours as needed.    . traMADol (ULTRAM) 50 MG tablet Take by mouth every 6 (six) hours as needed.     No current facility-administered medications for this visit.     Review of Systems  Constitutional: negative Eyes: negative Ears, nose, mouth, throat, and face: negative Respiratory: positive for pleurisy/chest pain Cardiovascular: negative Gastrointestinal: negative Genitourinary:negative Integument/breast: negative Hematologic/lymphatic: negative Musculoskeletal:negative Neurological: negative Behavioral/Psych: negative Endocrine: negative Allergic/Immunologic: negative  Physical Exam  ZOX:WRUEA, healthy, no distress, well nourished and well developed SKIN: skin color, texture, turgor are normal, no rashes or significant lesions HEAD: Normocephalic, No masses, lesions, tenderness or abnormalities EYES: normal, PERRLA, Conjunctiva are pink and non-injected EARS: External ears normal, Canals clear OROPHARYNX:no exudate, no erythema and lips, buccal mucosa, and tongue normal  NECK: supple, no adenopathy, no JVD LYMPH:  no palpable lymphadenopathy, no hepatosplenomegaly BREAST:not examined LUNGS: clear to auscultation , and palpation HEART: regular rate & rhythm, no murmurs and no gallops ABDOMEN:abdomen soft,  non-tender, normal bowel sounds and no masses or organomegaly BACK: Back symmetric, no curvature., No CVA tenderness EXTREMITIES:no joint deformities, effusion, or inflammation, no edema  NEURO: alert & oriented x 3 with fluent speech, no focal motor/sensory deficits  PERFORMANCE STATUS: ECOG 1  LABORATORY DATA: Lab Results  Component Value Date   WBC 10.3 11/03/2017   HGB 12.7 11/03/2017   HCT 37.6 11/03/2017   MCV 91.3 11/03/2017   PLT 303 11/03/2017      Chemistry      Component Value Date/Time   NA 135 09/20/2017 0530   K 3.4 (L) 09/20/2017 0530   CL 102 09/20/2017 0530   CO2 22 09/20/2017 0530   BUN 6 09/20/2017 0530   CREATININE 0.58 09/20/2017 0530      Component Value Date/Time   CALCIUM 8.5 (L) 09/20/2017 0530   ALKPHOS 82 09/19/2017 0830   AST 34 09/19/2017 0830   ALT 25 09/19/2017 0830   BILITOT 0.8 09/19/2017 0830       RADIOGRAPHIC STUDIES: Dg Chest 2 View  Result Date: 10/06/2017 CLINICAL DATA:  Recent VATS procedure for lung carcinoma EXAM: CHEST - 2 VIEW COMPARISON:  September 23, 2017 FINDINGS: There is no longer appreciable subcutaneous air on the right. Previously noted right-sided pneumothorax is no longer appreciable. There is a right pleural effusion with atelectatic change in the right base. Lungs elsewhere are clear. Heart size and pulmonary vascularity are normal. No adenopathy. No bone lesions. IMPRESSION: Right pleural effusion with right base atelectasis. No edema or consolidation evident. Interval resolution of right-sided pneumothorax and right subcutaneous air. Stable cardiac silhouette. Electronically Signed   By: Lowella Grip III M.D.   On: 10/06/2017 13:02   US Thyroid  Result Date: 10/16/2017 CLINICAL DATA:  Palpable abnormality.  Left thyroid nodule. EXAM: THYROID ULTRASOUND TECHNIQUE: Ultrasound examination of the thyroid gland and adjacent soft tissues was performed. COMPARISON:  None. FINDINGS: Parenchymal Echotexture: Moderately  heterogenous Isthmus: 0.2 cm Right lobe: 3.9 x 1.5 x 1.3 cm Left lobe: 4.4 x 1.6 x 1.7 cm _________________________________________________________ Estimated total number of nodules >/= 1 cm: 1 Number of spongiform nodules >/=  2 cm not described below (TR1): 0 Number of mixed cystic and solid nodules >/= 1.5 cm not described below ([place]): 0 _________________________________________________________ Nodule # 3: Location: Left; Inferior Maximum size: 1.4 cm; Other 2 dimensions: 1.1 x 0.9 cm Composition: mixed cystic and solid (1) Echogenicity: hypoechoic (2) Shape: not taller-than-wide (0) Margins: smooth (0) Echogenic foci: none (0) ACR TI-RADS total points: 3. ACR TI-RADS risk category: TR3 (3 points). ACR TI-RADS recommendations: Given size (<1.4 cm) and appearance, this nodule does NOT meet TI-RADS criteria for biopsy or dedicated follow-up. _________________________________________________________ Other scattered nodules measure 0.9 cm or less in size and do not meet criteria for biopsy nor follow-up. IMPRESSION: Multiple bilateral nodules are noted. None meet criteria for biopsy nor follow-up. The above is in keeping with the ACR TI-RADS recommendations - J Am Coll Radiol 2017;14:587-595. Electronically Signed   By: Marybelle Killings M.D.   On: 10/16/2017 15:56    ASSESSMENT: This is a very pleasant 67 years old white female recently diagnosed with a stage IA (T1b, N0, M0) non-small cell lung cancer, squamous cell carcinoma status post right lower lobectomy with lymph node dissection under the care of Dr. Servando Snare on September 16, 2017.   PLAN: I had a lengthy discussion with the patient today about her current disease stage, prognosis and treatment options. I explained to the patient that there is no survival benefit for adjuvant systemic chemotherapy or radiation for patient with a stage Ia non-small cell lung cancer..  I also explained to the patient that the 5-year survival is over 80% after surgical  resection. I recommended for the patient to continue on observation with repeat CT scan of the chest every 6 months for the first 2 years followed by annual scan for 5 years. I will arrange for the patient to come back for follow-up visit in 6 months for evaluation with repeat CT scan of the chest. The patient was advised to call immediately if she has any concerning symptoms in the interval. The patient voices understanding of current disease status and treatment options and is in agreement with the current care plan.  All questions were answered. The patient knows to call the clinic with any problems, questions or concerns. We can certainly see the patient much sooner if necessary.  Thank you so much for allowing me to participate in the care of Margaret Charles. I will continue to follow up the  patient with you and assist in her care.  I spent 40 minutes counseling the patient face to face. The total time spent in the appointment was 60 minutes.  Disclaimer: This note was dictated with voice recognition software. Similar sounding words can inadvertently be transcribed and may not be corrected upon review.   Eilleen Kempf Nov 03, 2017, 2:15 PM

## 2017-11-03 NOTE — Telephone Encounter (Signed)
Scheduled appt per 5/13 los -pt is aware of appt date and time.

## 2017-11-17 ENCOUNTER — Other Ambulatory Visit: Payer: Self-pay | Admitting: Physician Assistant

## 2017-12-08 ENCOUNTER — Encounter: Payer: Self-pay | Admitting: Cardiovascular Disease

## 2017-12-08 ENCOUNTER — Ambulatory Visit: Payer: Medicare Other | Admitting: Cardiovascular Disease

## 2017-12-08 VITALS — BP 112/58 | HR 66 | Ht 67.0 in | Wt 144.4 lb

## 2017-12-08 DIAGNOSIS — I480 Paroxysmal atrial fibrillation: Secondary | ICD-10-CM | POA: Diagnosis not present

## 2017-12-08 DIAGNOSIS — I10 Essential (primary) hypertension: Secondary | ICD-10-CM | POA: Diagnosis not present

## 2017-12-08 MED ORDER — METOPROLOL TARTRATE 25 MG PO TABS: 25.0000 mg | ORAL_TABLET | Freq: Two times a day (BID) | ORAL | 3 refills | Status: DC

## 2017-12-08 NOTE — Progress Notes (Signed)
Cardiology Office Note:    Date:  12/08/2017   ID:  Margaret Charles, DOB June 03, 1951, MRN [id number]  PCP:  [name], MD  Cardiologist:  Acie Fredrickson   Referring MD: Helane Rima, MD   Problem list 1. Paroxysmal atrial fib - found after partial pneumonectomy 2.  Lung nodule 3.  Right bundle branch block   Chief Complaint  Patient presents with  . Follow-up    RBBB     History of Present Illness:    Margaret Charles is a 67 y.o. female with a hx of 1.4 cm lung nodule.  She has a history of right bundle branch block and palpitations.  We are asked to see her for preoperative clearance prior to lung surgery.  She has a RBBB - which she has known about for the past 10 years . She has seen Renaldo in [place]. ( [facility])  Echo at that time showed normal LV function  Was told that she was stable  Developed palpitations a year or so later.   30 day monitr did not show any issues  She had a low dose lung CT scan for screening ( since she was a smoker )  Was found to have a 1.4 cm mass.    deines any chest pain or dyspnea.   Walks 3 miles a day a a good pace .  Able to climb 3 flights of stairs without any problems  Does water aerobics.  BP has been elevated . She eats lots of salt - popcorn every day .  Hx of obesity in her 7s .  Has lost 90 lbs   December 08, 2017  Margaret Charles is seen back today after her Rt lower lobe lobectomy She apparently developed atrial fibrillation while in the hospital.  Her blood pressure was also not well controlled.  She was started on amiodarone 200 mg twice a day.  Which was gradually titrated down.  She is now on 200 mg a day and metoprolol    Past Medical History:  Diagnosis Date  . Arthritis    ddd  . Cancer ([place])    skin basal cell cancer  . Chronic headaches   . Emphysema of lung ([place])   . Palpitations   . RBBB (right bundle branch block)    palpitations  . Seizures ([place])    1980s few ??? seizure activity  (vagal response)     Past Surgical History:  Procedure Laterality Date  . BLADDER SURGERY     used small int to build bladder  . CATARACT EXTRACTION W/ INTRAOCULAR LENS  IMPLANT, BILATERAL    . COLON SURGERY     resection small intestine from blockage(x2)  . obstructions     x2 bowel obstruction surgeries  . VIDEO ASSISTED THORACOSCOPY (VATS)/WEDGE RESECTION Right 09/16/2017   Procedure: VIDEO ASSISTED THORACOSCOPY (VATS)/LUNG RESECTION;  Surgeon: [name], MD;  Location: [place];  Service: Thoracic;  Laterality: Right;  Marland Kitchen VIDEO BRONCHOSCOPY N/A 09/16/2017   Procedure: VIDEO BRONCHOSCOPY;  Surgeon: [name], MD;  Location: [facility] OR;  Service: Thoracic;  Laterality: N/A;  . WRIST SURGERY      Current Medications: Current Meds  Medication Sig  . acetaminophen (TYLENOL) 500 MG tablet Take 500 mg by mouth every 6 (six) hours as needed.  Marland Kitchen ibuprofen (ADVIL,MOTRIN) 200 MG tablet Take 600-800 mg by mouth every 8 (eight) hours as needed for headache, mild pain or moderate pain.   . metoprolol tartrate (LOPRESSOR) 25 MG tablet Take  1 tablet (25 mg total) by mouth 2 (two) times daily.  . Multiple Vitamins-Minerals (CENTRUM ADULTS PO) Take 1 tablet by mouth daily.  Marland Kitchen Propylene Glycol-Glycerin (SOOTHE OP) Place 1 drop into both eyes 2 (two) times daily as needed (dry eyes).  . [DISCONTINUED] amiodarone (PACERONE) 200 MG tablet Take 2 tablets (400 mg total) by mouth 2 (two) times daily. For 5 days, then decrease to 200 mg daily  . [DISCONTINUED] metoprolol tartrate (LOPRESSOR) 25 MG tablet Take 1 tablet (25 mg total) by mouth 2 (two) times daily.     Allergies:   Codeine   Social History   Socioeconomic History  . Marital status: Single    Spouse name: Not on file  . Number of children: Not on file  . Years of education: Not on file  . Highest education level: Not on file  Occupational History  . Not on file  Social Needs  . Financial resource strain: Not on file  . Food insecurity:     Worry: Not on file    Inability: Not on file  . Transportation needs:    Medical: Not on file    Non-medical: Not on file  Tobacco Use  . Smoking status: Former Smoker    Packs/day: 1.00    Years: 40.00    Pack years: 40.00    Types: Cigarettes    Last attempt to quit: 12/03/2008    Years since quitting: 9.0  . Smokeless tobacco: Never Used  Substance and Sexual Activity  . Alcohol use: Not Currently    Comment: hx  etoh  37 yrs  . Drug use: Never  . Sexual activity: Not on file  Lifestyle  . Physical activity:    Days per week: Not on file    Minutes per session: Not on file  . Stress: Not on file  Relationships  . Social connections:    Talks on phone: Not on file    Gets together: Not on file    Attends religious service: Not on file    Active member of club or organization: Not on file    Attends meetings of clubs or organizations: Not on file    Relationship status: Not on file  Other Topics Concern  . Not on file  Social History Narrative  . Not on file     Family History: The patient's family history includes Breast cancer in her sister; CAD in her sister; Dementia in her mother; Prostate cancer in her father. There is no history of Thyroid disease.  ROS:   Please see the history of present illness.     All other systems reviewed and are negative.  EKGs/Labs/Other Studies Reviewed:    The following studies were reviewed today:   EKG:   NSR at 75.  RBBB .   Recent Labs: 09/19/2017: TSH 4.392 09/20/2017: Magnesium 1.8 11/03/2017: ALT 19; BUN 16; Creatinine 0.85; Hemoglobin 12.7; Platelet Count 303; Potassium 4.5; Sodium 137  Recent Lipid Panel No results found for: CHOL, TRIG, HDL, CHOLHDL, VLDL, LDLCALC, LDLDIRECT  Physical Exam:    VS:  BP (!) 112/58   Charles 65   Ht 5\' 7"  (1.702 m)   Wt 144 lb 6.4 oz (65.5 kg)   SpO2 99%   BMI 22.62 kg/m     Wt Readings from Last 3 Encounters:  12/08/17 144 lb 6.4 oz (65.5 kg)  11/03/17 141 lb (64 kg)   10/16/17 139 lb (63 kg)     GEN:  Well  nourished, well developed in no acute distress HEENT: Normal NECK: No JVD; No carotid bruits LYMPHATICS: No lymphadenopathy CARDIAC: RRR, no murmurs, rubs, gallops RESPIRATORY:  Clear to auscultation without rales, wheezing or rhonchi  ABDOMEN: Soft, non-tender, non-distended MUSCULOSKELETAL:  No edema; No deformity  SKIN: Warm and dry NEUROLOGIC:  Alert and oriented x 3 PSYCHIATRIC:  Normal affect   ASSESSMENT:  Plan      1.  Paroxysmal atrial fibrillation: The patient did well with her pneumonectomy but had a transient episode of paroxysmal atrial fibrillation.  She was treated with amiodarone and converted back to normal sinus rhythm.  She has been maintained on amiodarone and metoprolol 25 mg twice a day.  At this point it does not appear that she has had any recurrent episodes of atrial fibrillation.  We will discontinue the amiodarone now.  Continue metoprolol.  It is likely that this episode of PAF was caused by the surgery and she may not have recurrent episodes of atrial fib .  She will pay attention to her heart rate and will call us back if she has episodes of palpitations.  2.  Essential hypertension:    Currently on metoprolol    Medication Adjustments/Labs and Tests Ordered: Current medicines are reviewed at length with the patient today.  Concerns regarding medicines are outlined above.  No orders of the defined types were placed in this encounter.  Meds ordered this encounter  Medications  . metoprolol tartrate (LOPRESSOR) 25 MG tablet    Sig: Take 1 tablet (25 mg total) by mouth 2 (two) times daily.    Dispense:  180 tablet    Refill:  3    Signed, Mertie Moores, MD  12/08/2017 3:11 PM    [place]

## 2017-12-08 NOTE — Patient Instructions (Addendum)
Medication Instructions:  Your physician has recommended you make the following change in your medication:   STOP Amiodarone ([place]one)   Labwork: None Ordered   Testing/Procedures: None Ordered   Follow-Up: Your physician recommends that you follow-up in: 3 months with Pecolia Ades, NP on Monday Sept. 23 at 9:30 am   If you need a refill on your cardiac medications before your next appointment, please call your pharmacy.   Thank you for choosing [facility]! Christen Bame, RN [phone]

## 2017-12-29 ENCOUNTER — Encounter: Payer: Self-pay | Admitting: Family Medicine

## 2017-12-29 ENCOUNTER — Ambulatory Visit: Payer: Medicare Other | Admitting: Family Medicine

## 2017-12-29 VITALS — BP 112/80 | HR 64 | Temp 97.9°F | Ht 67.0 in | Wt 144.4 lb

## 2017-12-29 DIAGNOSIS — F1021 Alcohol dependence, in remission: Secondary | ICD-10-CM | POA: Diagnosis not present

## 2017-12-29 DIAGNOSIS — Z8679 Personal history of other diseases of the circulatory system: Secondary | ICD-10-CM | POA: Insufficient documentation

## 2017-12-29 DIAGNOSIS — I10 Essential (primary) hypertension: Secondary | ICD-10-CM

## 2017-12-29 DIAGNOSIS — R51 Headache: Secondary | ICD-10-CM

## 2017-12-29 DIAGNOSIS — J439 Emphysema, unspecified: Secondary | ICD-10-CM | POA: Diagnosis not present

## 2017-12-29 DIAGNOSIS — G8929 Other chronic pain: Secondary | ICD-10-CM

## 2017-12-29 DIAGNOSIS — E041 Nontoxic single thyroid nodule: Secondary | ICD-10-CM | POA: Diagnosis not present

## 2017-12-29 DIAGNOSIS — R002 Palpitations: Secondary | ICD-10-CM

## 2017-12-29 DIAGNOSIS — Z85828 Personal history of other malignant neoplasm of skin: Secondary | ICD-10-CM | POA: Insufficient documentation

## 2017-12-29 DIAGNOSIS — I4891 Unspecified atrial fibrillation: Secondary | ICD-10-CM | POA: Insufficient documentation

## 2017-12-29 DIAGNOSIS — C3431 Malignant neoplasm of lower lobe, right bronchus or lung: Secondary | ICD-10-CM

## 2017-12-29 NOTE — Assessment & Plan Note (Signed)
Patient doing really well s/p surgery for lung cancer resection. Has follow up with cardiology and cardiothoracic surgery in coming months.

## 2017-12-29 NOTE — Assessment & Plan Note (Signed)
Quit 1981 - recovered alcoholic.  Used this to help with 90 lb weight loss. Had been doing overeaters anonymous. Woke up one day and decided to quit alcohol while working on the weight loss. Has remained alcohol free

## 2017-12-29 NOTE — Progress Notes (Signed)
Phone: [phone]  Subjective:  Patient presents today to establish care.  Prior patient of Dr. Lavone Neri with [facility]. Chief complaint-noted.   See problem oriented charting  The following were reviewed and entered/updated in epic: Past Medical History:  Diagnosis Date  . Arthritis    ddd  . Chronic headaches    imprved recently per patient   . Emphysema of lung ([place])    asymptomatic. noted on CT  . History of adenomatous polyp of colon    2015 precancerous polyp history- due spring 2020.  5 years  . History of seizures    1980s few ??? seizure activity  (vagal response)  . Hypertension   . Palpitations   . RBBB (right bundle branch block)    palpitations  . Skin cancer    skin basal cell cancer   Patient Active Problem List   Diagnosis Date Noted  . History of atrial fibrillation 12/29/2017    Priority: High  . Alcoholism in remission ([place]) 12/29/2017    Priority: High  . Emphysema of lung ([place])     Priority: High  . S/P lobectomy of lung 09/16/2017    Priority: High  . Lung cancer, Right lower lobe ([place]) 08/06/2017    Priority: High  . Hypertension 12/29/2017    Priority: Medium  . History of skin cancer 12/29/2017    Priority: Medium  . Chronic headaches     Priority: Medium  . Left thyroid nodule 10/07/2017    Priority: Medium  . Palpitations 03/25/2017    Priority: Medium  . Lens replaced by other means 08/12/2017    Priority: Low  . Tear film insufficiency 08/12/2017    Priority: Low  . Vitreous degeneration 08/12/2017    Priority: Low  . Cervicogenic headache 08/11/2017    Priority: Low  . DDD (degenerative disc disease), cervical 08/11/2017    Priority: Low  . Arthritis of facet joint of cervical spine 07/28/2017    Priority: Low  . RBBB 07/11/2015    Priority: Low  . Senile nuclear sclerosis 12/25/2011    Priority: Low  . Myopia 12/10/2011    Priority: Low   Past Surgical History:  Procedure Laterality Date  . BLADDER SURGERY  1985   Long term incontinence. [facility]- used small intestine to build bladder  . BREAST BIOPSY  2001  . CATARACT EXTRACTION W/ INTRAOCULAR LENS  IMPLANT, BILATERAL    . Intestinal blockage  1989 and 1990   bowel obstruction surgery  . Lung cancer removal   08/2017   Right lower lobe  . VIDEO ASSISTED THORACOSCOPY (VATS)/WEDGE RESECTION Right 09/16/2017   Procedure: VIDEO ASSISTED THORACOSCOPY (VATS)/LUNG RESECTION;  Surgeon: [name], MD;  Location: [facility];  Service: Thoracic;  Laterality: Right;  Marland Kitchen VIDEO BRONCHOSCOPY N/A 09/16/2017   Procedure: VIDEO BRONCHOSCOPY;  Surgeon: [name], MD;  [facility];  Service: Thoracic;  Laterality: N/A;  . WRIST SURGERY     de quervains tenosynovitis. Dr. Maureen Ralphs actually did this.     Family History  Problem Relation Age of Onset  . Dementia Mother   . Heart attack Mother        age 58. after hip fracture.   . Hypertension Mother   . Prostate cancer Father   . Hypertension Father   . Breast cancer Sister   . Hypertension Sister   . Heart attack Sister        75. former smoker  . Heart attack Paternal Grandfather   . Hypertension  Sister     Medications- reviewed and updated Current Outpatient Medications  Medication Sig Dispense Refill  . metoprolol tartrate (LOPRESSOR) 25 MG tablet Take 1 tablet (25 mg total) by mouth 2 (two) times daily. 180 tablet 3  . Multiple Vitamins-Minerals (CENTRUM ADULTS PO) Take 1 tablet by mouth daily.    Marland Kitchen Propylene Glycol-Glycerin (SOOTHE OP) Place 1 drop into both eyes 2 (two) times daily as needed (dry eyes).     No current facility-administered medications for this visit.     Allergies-reviewed and updated Allergies  Allergen Reactions  . Codeine Nausea And Vomiting    Social History   Social History Narrative   Single. Lives alone.    Lived in [place] for ome years to help with parents but otherwise in [place] most of life.       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  HENT: Negative for hearing loss and tinnitus.   Eyes: Negative for blurred vision and double vision.  Respiratory: Negative for cough, hemoptysis, shortness of breath and wheezing.   Cardiovascular: Negative for chest pain and palpitations.  Gastrointestinal: Negative for abdominal pain, heartburn, nausea and vomiting.  Genitourinary: Negative for dysuria and urgency.  Musculoskeletal: Positive for myalgias and neck pain.  Skin: Negative for itching and rash.  Neurological: Positive for headaches. Negative for dizziness.  Endo/Heme/Allergies: Negative for polydipsia. Does not bruise/bleed easily.  Psychiatric/Behavioral: Negative for hallucinations and substance abuse.   Objective: BP 112/80 (BP Location: Left Arm, Patient Position: Sitting, Cuff Size: Normal)   Pulse 64   Temp 97.9 F (36.6 C) (Oral)   Ht 5\' 7"  (1.702 m)   Wt 144 lb 6.4 oz (65.5 kg)   SpO2 97%   BMI 22.62 kg/m  Gen: NAD, resting comfortably HEENT: Mucous membranes are moist. Oropharynx normal. TM normal. Eyes: sclera and lids normal, PERRLA Neck: no thyromegaly, no cervical lymphadenopathy CV: RRR no murmurs rubs or gallops Lungs: CTAB no crackles, wheeze, rhonchi Abdomen: soft/nontender other than mild pain in RUQ/nondistended/normal bowel sounds. No rebound or guarding.  Ext: no edema Skin: warm, dry Neuro: 5/5 strength in upper and lower extremities, normal gait, normal reflexes  Assessment/Plan:  Other notes: 1.colonoscopy 2015, Td 2016 per patient report, on shingrix waiting list. Partial dentures 2. Already back in pool doing water aerobics in June- using pool noodle  Hypertension S: controlled on Metoprolol 25mg  BID BP Readings from Last 3 Encounters:    12/29/17 112/80  12/08/17 (!) 112/58  11/03/17 139/85  A/P: We discussed blood pressure goal of <140/90. Continue current meds    Alcoholism in remission [facility]) Quit 1981 - recovered alcoholic.  Used this to help with 90 lb weight loss. Had been doing overeaters anonymous. Woke up one day and decided to quit alcohol while working on the weight loss. Has remained alcohol free  Lung cancer, Right lower lobe [facility]) Patient doing really well s/p surgery for lung cancer resection. Has follow up with cardiology and cardiothoracic surgery in coming months.   Emphysema of lung ([place]) asymptomatic. noted on CT   Future Appointments  Date Time Provider [place]  01/22/2018 12:00 PM [name], MD [facility]  03/16/2018  9:30 AM Daune Perch, NP [facility]  03/20/2018  8:15 AM [name], MD [facility]  05/04/2018  1:00 PM [facility] 6 [facility] None  05/06/2018 10:45 AM Curt Bears, MD [facility] None  06/10/2018 11:20 AM Nahser, Wonda Cheng, MD [facility]   Return precautions advised. Garret Reddish, MD

## 2017-12-29 NOTE — Assessment & Plan Note (Signed)
asymptomatic. noted on CT

## 2017-12-29 NOTE — Assessment & Plan Note (Signed)
S: controlled on Metoprolol 25mg  BID BP Readings from Last 3 Encounters:  12/29/17 112/80  12/08/17 (!) 112/58  11/03/17 139/85  A/P: We discussed blood pressure goal of <140/90. Continue current meds

## 2017-12-29 NOTE — Patient Instructions (Addendum)
Health Maintenance Due  Topic Date Due  . Hepatitis C Screening - Our team will request records from past Dr. [birth year]  . TETANUS/TDAP -  Our team will request records from past Dr 09/14/1968  . MAMMOGRAM -  Our team will request records from past Dr 09/15/1999  . COLONOSCOPY - Sign release of information at the check out desk for records from [facility] for last colonoscopy 09/15/1999  . Glad you are on waiting list for shingrix   . I would also like for you to sign up for an annual wellness visit with one of our nurses, Cassie or Manuela Schwartz, who both specialize in the annual wellness visit. This is a free benefit under medicare that may help [place] find additional ways to help you. Some highlights are reviewing medications, lifestyle, and doing a dementia screen.   Marland Kitchen DEXA SCAN -  Our team will request records from past Dr 09/15/2014   Lets schedule a physical. Come fasting and we will update bloodwork. Needs to be after 03/19/18. Can also schedule the wellness visit with nursing staff that same day or later- could schedule it 6 months out from physical as well if you prefer.

## 2018-01-09 ENCOUNTER — Ambulatory Visit: Payer: Medicare Other | Admitting: Family Medicine

## 2018-01-09 ENCOUNTER — Ambulatory Visit: Payer: Self-pay | Admitting: Family Medicine

## 2018-01-09 ENCOUNTER — Encounter: Payer: Self-pay | Admitting: Family Medicine

## 2018-01-09 VITALS — BP 126/72 | HR 60 | Temp 98.1°F | Ht 67.0 in | Wt 145.0 lb

## 2018-01-09 DIAGNOSIS — H6123 Impacted cerumen, bilateral: Secondary | ICD-10-CM | POA: Diagnosis not present

## 2018-01-09 NOTE — Telephone Encounter (Signed)
noted 

## 2018-01-09 NOTE — Progress Notes (Signed)
Subjective:  Margaret Charles is a 67 y.o. year old very pleasant female patient who presents for/with See problem oriented charting ROS- hearing loss in right ear and fullness as well as tinnitus. No fever or chills. No nasal congestion.    Past Medical History-  Patient Active Problem List   Diagnosis Date Noted  . History of atrial fibrillation 12/29/2017    Priority: High  . Alcoholism in remission ([place]) 12/29/2017    Priority: High  . Emphysema of lung ([place])     Priority: High  . S/P lobectomy of lung 09/16/2017    Priority: High  . Lung cancer, Right lower lobe ([place]) 08/06/2017    Priority: High  . Hypertension 12/29/2017    Priority: Medium  . History of skin cancer 12/29/2017    Priority: Medium  . Chronic headaches     Priority: Medium  . Left thyroid nodule 10/07/2017    Priority: Medium  . Palpitations 03/25/2017    Priority: Medium  . Lens replaced by other means 08/12/2017    Priority: Low  . Tear film insufficiency 08/12/2017    Priority: Low  . Vitreous degeneration 08/12/2017    Priority: Low  . Cervicogenic headache 08/11/2017    Priority: Low  . DDD (degenerative disc disease), cervical 08/11/2017    Priority: Low  . Arthritis of facet joint of cervical spine 07/28/2017    Priority: Low  . RBBB 07/11/2015    Priority: Low  . Senile nuclear sclerosis 12/25/2011    Priority: Low  . Myopia 12/10/2011    Priority: Low    Medications- reviewed and updated Current Outpatient Medications  Medication Sig Dispense Refill  . metoprolol tartrate (LOPRESSOR) 25 MG tablet Take 1 tablet (25 mg total) by mouth 2 (two) times daily. 180 tablet 3  . Multiple Vitamins-Minerals (CENTRUM ADULTS PO) Take 1 tablet by mouth daily.    Marland Kitchen Propylene Glycol-Glycerin (SOOTHE OP) Place 1 drop into both eyes 2 (two) times daily as needed (dry eyes).     No current facility-administered medications for this visit.     Objective: BP 126/72 (BP Location: Left Arm, Patient  Position: Sitting, Cuff Size: Large)   Pulse 60   Temp 98.1 F (36.7 C) (Oral)   Ht 5\' 7"  (1.702 m)   Wt 145 lb (65.8 kg)   SpO2 97%   BMI 22.71 kg/m  Gen: NAD, resting comfortably TM obstructed fully on right before irrigation- partially obscured after irrigation. Canal with slight bleed from curette in right ear. Left ear with nearly full obstruction resolved with irrigation CV: RRR  Lungs: nonlabored, normal respiratory rate Abdomen: soft/nondistended Neuro: limited hearing during visit resolved after irrigation  Procedure note: Cerumen noted in left ear.  Irrigation with water and peroxide performed. Full view of tympanic membrane after procedure.  Patient tolerated procedure well  Similar procedure done on right with partial obscuration on portion of TM even after irigation  Assessment/Plan:  Bilateral hearing loss due to cerumen impaction S: right ear was clogged last week- she tried debrox at home and didn't see much come out. Also couldn't hear well from that ear. Kept using debrox. Didn't note any fullness issues in left ear. Doing ok from left ear perspective  States hearing issues have been present at least 1.5 weeks.   States she has had ongoing vertigo issues in adulthood. Had a more prominent episode of vertigo this morning when trying to sit up from bed. No falls. No vertigo at present or with  irrigation today A/P: Successful irrigation of left ear- she was found to have near full occlusion on that side and wanted to be proactive. On the right, hearing was restored and partial view of TM noted- still some deep wax up against TM though. She is going to try mineral oil or baby oil as per an ENT told her years ago  Future Appointments  Date Time Provider [place]  01/22/2018 12:00 PM [name], MD [facility]  03/16/2018  9:30 AM Daune Perch, NP [facility]  03/20/2018  8:15 AM [name], MD [facility]  05/04/2018  1:00  PM [facility] 6 [facility] None  05/06/2018 10:45 AM Curt Bears, MD [facility] None  06/10/2018 11:20 AM Nahser, Wonda Cheng, MD [facility]   Return precautions advised.  Garret Reddish, MD

## 2018-01-09 NOTE — Telephone Encounter (Signed)
Called in c/o having vertigo from wax build up in her ears.   She has a history of this.   She saw Dr. Yong Channel last week.   He suggested she try the OTC drops which she did without relief.  The nurse that usually flushes out ears was on vacation last week when she was in.     She called in requesting to just come in and have her ears flushed.   I spoke with the flow coordinator.   Dr. Yong Channel does want to see her so I scheduled her a same day appt for today at 11:30.   She was agreeable to this.   Reason for Disposition . [1] MILD dizziness (e.g., vertigo; walking normally) AND [2] has been evaluated by physician for this  Answer Assessment - Initial Assessment Questions 1. DESCRIPTION: "Describe your dizziness."     I'm prone to wax build up in my ears.   Dr. Ansel Bong nurse that flushes out ears was on vacation.   I've been using drops in my ears for the wax from OTC.  Can I come in and get my ears flushed?   I had this problem many times before.   I'm going on vacation next week so I wanted to get this taken care of.   I will be flying. 2. VERTIGO: "Do you feel like either you or the room is spinning or tilting?"      I'm ok now.    I have a history of vertigo problems.   This morning when I got out of bed I had to sit there for a minute. 3. LIGHTHEADED: "Do you feel lightheaded?" (e.g., somewhat faint, woozy, weak upon standing)     Not feeling like I could faint, No. 4. SEVERITY: "How bad is it?"  "Can you walk?"   - MILD - Feels unsteady but walking normally.   - MODERATE - Feels very unsteady when walking, but not falling; interferes with normal activities (e.g., school, work) .   - SEVERE - Unable to walk without falling (requires assistance).     I'm fine 5. ONSET:  "When did the dizziness begin?"     I had it last week.   I saw Dr. Yong Channel last week.   He had me do the drops which are not working. 6. AGGRAVATING FACTORS: "Does anything make it worse?" (e.g., standing, change in head  position)     *No Answer* 7. CAUSE: "What do you think is causing the dizziness?"     Wax build up 8. RECURRENT SYMPTOM: "Have you had dizziness before?" If so, ask: "When was the last time?" "What happened that time?"     Yes 9. OTHER SYMPTOMS: "Do you have any other symptoms?" (e.g., headache, weakness, numbness, vomiting, earache)     No 10. PREGNANCY: "Is there any chance you are pregnant?" "When was your last menstrual period?"       Not asked  Protocols used: DIZZINESS - VERTIGO-A-AH

## 2018-01-21 ENCOUNTER — Other Ambulatory Visit: Payer: Self-pay | Admitting: Cardiothoracic Surgery

## 2018-01-21 DIAGNOSIS — C3490 Malignant neoplasm of unspecified part of unspecified bronchus or lung: Secondary | ICD-10-CM

## 2018-01-22 ENCOUNTER — Encounter: Payer: Self-pay | Admitting: Cardiothoracic Surgery

## 2018-01-22 ENCOUNTER — Ambulatory Visit
Admission: RE | Admit: 2018-01-22 | Discharge: 2018-01-22 | Disposition: A | Discharge status: Home / Self Care | Payer: Medicare Other | Source: Ambulatory Visit | Attending: Cardiothoracic Surgery | Admitting: Cardiothoracic Surgery

## 2018-01-22 ENCOUNTER — Ambulatory Visit: Payer: Medicare Other | Admitting: Cardiothoracic Surgery

## 2018-01-22 ENCOUNTER — Other Ambulatory Visit: Payer: Self-pay

## 2018-01-22 VITALS — BP 114/84 | HR 68 | Resp 16 | Ht 67.0 in | Wt 145.0 lb

## 2018-01-22 DIAGNOSIS — Z902 Acquired absence of lung [part of]: Secondary | ICD-10-CM

## 2018-01-22 DIAGNOSIS — C3490 Malignant neoplasm of unspecified part of unspecified bronchus or lung: Secondary | ICD-10-CM

## 2018-01-22 DIAGNOSIS — C3431 Malignant neoplasm of lower lobe, right bronchus or lung: Secondary | ICD-10-CM | POA: Diagnosis not present

## 2018-01-22 NOTE — Progress Notes (Signed)
[address]       [place] [address]             [phone]                  Raileigh [place] Medical Record #[id number] Date of Birth: 06/11/1951  Referring XB:[id number], Ronie Spies, MD Primary Cardiology: Primary Care:Hunter, Brayton Mars, MD  Chief Complaint:  Follow Up Visit 09/16/2017  OPERATIVE REPORT PREOPERATIVE DIAGNOSIS:  Right lower lobe lung mass. POSTOPERATIVE DIAGNOSES: 1. Right lower lobe lung mass. 2. Non-small cell carcinoma of the lung. PROCEDURES PERFORMED: 1. Bronchoscopy. 2. Right video-assisted thoracoscopy. 3. Wedge resection, right lower lobe. 4. Completion right lower lobectomy with lymph node dissection. 5. Placement of On-Q. SURGEON:  Lanelle Bal, MD   Cancer Staging Lung cancer, Right lower lobe [facility]) Staging form: Lung, AJCC 8th Edition - Clinical: No stage assigned - Unsigned - Pathologic stage from 09/19/2017: Stage IA2 (pT1b, pN0, cM0) - Signed by [name], MD on 09/19/2017   History of Present Illness:      Patient returns office today over 3 months postop from resection of right lower lobe for stage I A2 non-small cell carcinoma of the lung.  The patient is been progressing well, returned to near normal activities.  She is return to water aerobics.  She does note some dysesthesias over the right chest especially when she was trying to use a wraparound float while in the pool.     Zubrod Score: At the time of surgery this patient's most appropriate activity status/level should be described as: [x]     0    Normal activity, no symptoms []     1    Restricted in physical strenuous activity but ambulatory, able to do out light work []     2    Ambulatory and capable of self care, unable to do work activities, up and about                 >50 % of waking hours                                                                                   []     3    Only limited self care, in bed greater than 50% of  waking hours []     4    Completely disabled, no self care, confined to bed or chair []     5    Moribund  Social History   Tobacco Use  Smoking Status Former Smoker  . Packs/day: 1.00  . Years: 40.00  . Pack years: 40.00  . Types: Cigarettes  . Last attempt to quit: 12/03/2008  . Years since quitting: 9.1  Smokeless Tobacco Never Used       Allergies  Allergen Reactions  . Codeine Nausea And Vomiting    Current Outpatient Medications  Medication Sig Dispense Refill  . metoprolol tartrate (LOPRESSOR) 25 MG tablet Take 1 tablet (25 mg total) by mouth 2 (two) times daily. 180 tablet 3  . Multiple Vitamins-Minerals (CENTRUM ADULTS PO) Take 1 tablet by mouth daily.    Marland Kitchen Propylene Glycol-Glycerin (  SOOTHE OP) Place 1 drop into both eyes 2 (two) times daily as needed (dry eyes).     No current facility-administered medications for this visit.        Physical Exam: BP 114/84 (BP Location: Right Arm, Patient Position: Sitting, Cuff Size: Normal)   Pulse 68   Resp 16   Ht 5\' 7"  (1.702 m)   Wt 145 lb (65.8 kg)   SpO2 97% Comment: ON RA  BMI 22.71 kg/m   General appearance: alert, cooperative and no distress Head: Normocephalic, without obvious abnormality, atraumatic Neck: no adenopathy, no carotid bruit, no JVD, supple, symmetrical, trachea midline and thyroid not enlarged, symmetric, no tenderness/mass/nodules Lymph nodes: Cervical, supraclavicular, and axillary nodes normal. Resp: clear to auscultation bilaterally Cardio: regular rate and rhythm, S1, S2 normal, no murmur, click, rub or gallop GI: soft, non-tender; bowel sounds normal; no masses,  no organomegaly Extremities: extremities normal, atraumatic, no cyanosis or edema Neurologic: Grossly normal Wound chest tube sites incision right chest are healing well  Diagnostic Studies & Laboratory data:         Recent Radiology Findings: Dg Chest 2 View  Result Date: 01/22/2018 CLINICAL DATA:  Right-sided lung cancer.   Right-sided discomfort. EXAM: CHEST - 2 VIEW COMPARISON:  10/06/2017.  PET-CT 08/25/2017. FINDINGS: Mediastinum and hilar structures are stable. Mild prominence of the ascending aorta is noted. This is unchanged. Heart size stable. Right base atelectasis and pleural effusion improved from prior exam. Right base pleural-parenchymal thickening present most likely related to scarring. No pneumothorax. No acute bony abnormality IMPRESSION: Right-sided pleural effusion and right base atelectasis improved from prior exam. Right base pleural-parenchymal thickening present most likely related to scarring. Electronically Signed   By: Marcello Moores  Register   On: 01/22/2018 11:38      I have independently reviewed the above radiology findings and reviewed findings  with the patient.  Recent Labs: Lab Results  Component Value Date   WBC 10.3 11/03/2017   HGB 12.7 11/03/2017   HCT 37.6 11/03/2017   PLT 303 11/03/2017   GLUCOSE 118 11/03/2017   ALT 19 11/03/2017   AST 16 11/03/2017   NA 137 11/03/2017   K 4.5 11/03/2017   CL 105 11/03/2017   CREATININE 0.85 11/03/2017   BUN 16 11/03/2017   CO2 25 11/03/2017   TSH 4.392 09/19/2017   INR 0.97 09/11/2017      Assessment / Plan:      Patient stable following recent resection of the right lower lobe for stage Ia non-small cell lung cancer without evidence of recurrence Patient has a CT scan of the chest arranged for November 2019, I will plan to see her back in February 2020.    [name] 01/22/2018 12:42 PM

## 2018-03-16 ENCOUNTER — Ambulatory Visit: Payer: Medicare Other | Admitting: Cardiology

## 2018-03-16 ENCOUNTER — Other Ambulatory Visit: Payer: Self-pay

## 2018-03-16 DIAGNOSIS — R079 Chest pain, unspecified: Secondary | ICD-10-CM

## 2018-03-16 DIAGNOSIS — R091 Pleurisy: Secondary | ICD-10-CM

## 2018-03-18 ENCOUNTER — Other Ambulatory Visit: Payer: Self-pay | Admitting: Cardiothoracic Surgery

## 2018-03-18 DIAGNOSIS — C3490 Malignant neoplasm of unspecified part of unspecified bronchus or lung: Secondary | ICD-10-CM

## 2018-03-19 ENCOUNTER — Ambulatory Visit
Admission: RE | Admit: 2018-03-19 | Discharge: 2018-03-19 | Disposition: A | Discharge status: Home / Self Care | Payer: Medicare Other | Source: Ambulatory Visit | Attending: Cardiothoracic Surgery | Admitting: Cardiothoracic Surgery

## 2018-03-19 ENCOUNTER — Ambulatory Visit: Payer: Medicare Other | Admitting: Cardiothoracic Surgery

## 2018-03-19 ENCOUNTER — Other Ambulatory Visit: Payer: Self-pay

## 2018-03-19 ENCOUNTER — Encounter: Payer: Self-pay | Admitting: Cardiothoracic Surgery

## 2018-03-19 VITALS — BP 110/60 | HR 58 | Resp 16 | Ht 67.0 in | Wt 145.0 lb

## 2018-03-19 DIAGNOSIS — Z902 Acquired absence of lung [part of]: Secondary | ICD-10-CM | POA: Diagnosis not present

## 2018-03-19 DIAGNOSIS — Z4889 Encounter for other specified surgical aftercare: Secondary | ICD-10-CM

## 2018-03-19 DIAGNOSIS — C3431 Malignant neoplasm of lower lobe, right bronchus or lung: Secondary | ICD-10-CM | POA: Diagnosis not present

## 2018-03-19 DIAGNOSIS — C3490 Malignant neoplasm of unspecified part of unspecified bronchus or lung: Secondary | ICD-10-CM

## 2018-03-19 NOTE — Progress Notes (Signed)
[address]       [place] [address]             [phone]                  Margaret [facility] [place] Medical Record #[id number] Date of Birth: 02-23-1951  Referring QI:[id number], Ronie Spies, MD Primary Cardiology: Primary Care:Hunter, Brayton Mars, MD  Chief Complaint:  Follow Up Visit 09/16/2017  OPERATIVE REPORT PREOPERATIVE DIAGNOSIS:  Right lower lobe lung mass. POSTOPERATIVE DIAGNOSES: 1. Right lower lobe lung mass. 2. Non-small cell carcinoma of the lung. PROCEDURES PERFORMED: 1. Bronchoscopy. 2. Right video-assisted thoracoscopy. 3. Wedge resection, right lower lobe. 4. Completion right lower lobectomy with lymph node dissection. 5. Placement of On-Q. SURGEON:  Lanelle Bal, MD   Cancer Staging Lung cancer, Right lower lobe [facility]) Staging form: Lung, AJCC 8th Edition - Clinical: No stage assigned - Unsigned - Pathologic stage from 09/19/2017: Stage IA2 (pT1b, pN0, cM0) - Signed by [name], MD on 09/19/2017   History of Present Illness:     Patient is doing been doing well after her resection of stage I a right lower lobe non-small cell lung cancer.  She noted she had neck injections done last week and 24 hours later began having right chest pain and made an appointment to be seen in the office today.  She notes since the pain started earlier in the week it has improved.  Not associated with shortness of breath.  She comes in today with follow-up chest x-ray.    Zubrod Score: At the time of surgery this patient's most appropriate activity status/level should be described as: [x]     0    Normal activity, no symptoms []     1    Restricted in physical strenuous activity but ambulatory, able to do out light work []     2    Ambulatory and capable of self care, unable to do work activities, up and about                 >50 % of waking hours                                                                                   []     3    Only  limited self care, in bed greater than 50% of waking hours []     4    Completely disabled, no self care, confined to bed or chair []     5    Moribund  Social History   Tobacco Use  Smoking Status Former Smoker  . Packs/day: 1.00  . Years: 40.00  . Pack years: 40.00  . Types: Cigarettes  . Last attempt to quit: 12/03/2008  . Years since quitting: 9.2  Smokeless Tobacco Never Used       Allergies  Allergen Reactions  . Codeine Nausea And Vomiting    Current Outpatient Medications  Medication Sig Dispense Refill  . metoprolol tartrate (LOPRESSOR) 25 MG tablet Take 1 tablet (25 mg total) by mouth 2 (two) times daily. 180 tablet 3  . Multiple Vitamins-Minerals (CENTRUM ADULTS PO) Take  1 tablet by mouth daily.    Marland Kitchen Propylene Glycol-Glycerin (SOOTHE OP) Place 1 drop into both eyes 2 (two) times daily as needed (dry eyes).    . zonisamide (ZONEGRAN) 25 MG capsule Take 25 mg by mouth 2 (two) times daily.     No current facility-administered medications for this visit.        Physical Exam: BP 110/60 (BP Location: Right Arm, Patient Position: Sitting, Cuff Size: Normal)   Pulse (!) 58   Resp 16   Ht 5\' 7"  (1.702 m)   Wt 145 lb (65.8 kg)   SpO2 99% Comment: RA  BMI 22.71 kg/m  General appearance: alert and cooperative Head: Normocephalic, without obvious abnormality, atraumatic Neck: no adenopathy, no carotid bruit, no JVD, supple, symmetrical, trachea midline and thyroid not enlarged, symmetric, no tenderness/mass/nodules Lymph nodes: Cervical, supraclavicular, and axillary nodes normal. Resp: clear to auscultation bilaterally Back: symmetric, no curvature. ROM normal. No CVA tenderness. Cardio: regular rate and rhythm, S1, S2 normal, no murmur, click, rub or gallop GI: soft, non-tender; bowel sounds normal; no masses,  no organomegaly Extremities: extremities normal, atraumatic, no cyanosis or edema and Homans sign is negative, no sign of DVT Neurologic: Grossly  normal Right chest incision and chest tube sites are all well-healed without palpable masses or tenderness.  Diagnostic Studies & Laboratory data:         Recent Radiology Findings: Dg Chest 2 View  Result Date: 03/19/2018 CLINICAL DATA:  History of VATS procedure right side for lung carcinoma. Chest pain on the right. EXAM: CHEST - 2 VIEW COMPARISON:  January 22, 2018 FINDINGS: There is scarring in the right base with small residual pleural effusion on the right. There is no edema or consolidation. There is no appreciable mass or adenopathy. Heart size and pulmonary vascularity are stable with scarring on the right and normal pulmonary vascularity appearance on the left. No evident bone lesions. IMPRESSION: Scarring with volume loss right lung. Small right pleural effusion persists. No new opacity. Stable cardiac silhouette. No adenopathy demonstrable. Electronically Signed   By: Lowella Grip III M.D.   On: 03/19/2018 09:24      I have independently reviewed the above radiology findings and reviewed findings  with the patient.  Recent Labs: Lab Results  Component Value Date   WBC 10.3 11/03/2017   HGB 12.7 11/03/2017   HCT 37.6 11/03/2017   PLT 303 11/03/2017   GLUCOSE 118 11/03/2017   ALT 19 11/03/2017   AST 16 11/03/2017   NA 137 11/03/2017   K 4.5 11/03/2017   CL 105 11/03/2017   CREATININE 0.85 11/03/2017   BUN 16 11/03/2017   CO2 25 11/03/2017   TSH 4.392 09/19/2017   INR 0.97 09/11/2017      Assessment / Plan:   Right chest wall discomfort 6 months following right thoracotomy, 24 hours after multiple neck injections and shoulder injections.  Chest x-ray shows no abnormalities no evidence of pneumothorax.  Patient will have a follow-up CT scan already scheduled for November 2019,   Unclear the cause but may be related to her multiple neck injections, no obvious abnormality on chest x-ray   [name] 03/19/2018 10:06 AM

## 2018-03-20 ENCOUNTER — Ambulatory Visit (INDEPENDENT_AMBULATORY_CARE_PROVIDER_SITE_OTHER): Payer: Medicare Other | Admitting: Family Medicine

## 2018-03-20 ENCOUNTER — Encounter: Payer: Self-pay | Admitting: Family Medicine

## 2018-03-20 VITALS — BP 116/74 | HR 65 | Temp 97.6°F | Ht 67.0 in | Wt 143.0 lb

## 2018-03-20 DIAGNOSIS — Z23 Encounter for immunization: Secondary | ICD-10-CM | POA: Diagnosis not present

## 2018-03-20 DIAGNOSIS — Z0000 Encounter for general adult medical examination without abnormal findings: Secondary | ICD-10-CM | POA: Diagnosis not present

## 2018-03-20 DIAGNOSIS — Z1159 Encounter for screening for other viral diseases: Secondary | ICD-10-CM

## 2018-03-20 DIAGNOSIS — I10 Essential (primary) hypertension: Secondary | ICD-10-CM | POA: Diagnosis not present

## 2018-03-20 LAB — URINALYSIS
BILIRUBIN URINE: NEGATIVE
Hgb urine dipstick: NEGATIVE
Ketones, ur: NEGATIVE
LEUKOCYTES UA: NEGATIVE
Nitrite: NEGATIVE
PH: 6.5 (ref 5.0–8.0)
Specific Gravity, Urine: 1.01 (ref 1.000–1.030)
Total Protein, Urine: NEGATIVE
Urine Glucose: NEGATIVE
Urobilinogen, UA: 0.2 (ref 0.0–1.0)

## 2018-03-20 LAB — CBC
HCT: 39.1 % (ref 36.0–46.0)
Hemoglobin: 13.4 g/dL (ref 12.0–15.0)
MCHC: 34.2 g/dL (ref 30.0–36.0)
MCV: 89 fl (ref 78.0–100.0)
Platelets: 287 10*3/uL (ref 150.0–400.0)
RBC: 4.39 Mil/uL (ref 3.87–5.11)
RDW: 13.6 % (ref 11.5–15.5)
WBC: 8.1 10*3/uL (ref 4.0–10.5)

## 2018-03-20 LAB — COMPREHENSIVE METABOLIC PANEL
ALK PHOS: 97 U/L (ref 39–117)
ALT: 14 U/L (ref 0–35)
AST: 14 U/L (ref 0–37)
Albumin: 4.1 g/dL (ref 3.5–5.2)
BILIRUBIN TOTAL: 0.5 mg/dL (ref 0.2–1.2)
BUN: 14 mg/dL (ref 6–23)
CO2: 27 meq/L (ref 19–32)
Calcium: 9.3 mg/dL (ref 8.4–10.5)
Chloride: 106 mEq/L (ref 96–112)
Creatinine, Ser: 0.86 mg/dL (ref 0.40–1.20)
GFR: 69.64 mL/min (ref >60.00)
Glucose, Bld: 93 mg/dL (ref 70–99)
Potassium: 4.4 mEq/L (ref 3.5–5.1)
Sodium: 138 mEq/L (ref 135–145)
Total Protein: 7 g/dL (ref 6.0–8.3)

## 2018-03-20 LAB — LIPID PANEL
CHOL/HDL RATIO: 3
Cholesterol: 175 mg/dL (ref 0–200)
HDL: 57.9 mg/dL (ref >39.00)
LDL Cholesterol: 96 mg/dL (ref 0–99)
NonHDL: 117.04
TRIGLYCERIDES: 103 mg/dL (ref 0.0–149.0)
VLDL: 20.6 mg/dL (ref 0.0–40.0)

## 2018-03-20 NOTE — Addendum Note (Signed)
Addended by: Kayren Eaves T on: 03/20/2018 09:03 AM   Modules accepted: Orders

## 2018-03-20 NOTE — Patient Instructions (Addendum)
Health Maintenance Due  Topic Date Due  . MAMMOGRAM - can see done 21/19 but need the actual report- see colonoscopy section below 09/15/1999  . COLONOSCOPY - Thanks for bringing your report!!! 09/15/1999  . INFLUENZA VACCINE - today  01/22/2018   Move shingrix out beyond your steroid shots since it is not as time sensitive as the flu shot and you already had the old shingles shot.   Often see folks at 6 months for blood pressure check but definitely want to see you in a year for a physical.   I would also like for you to sign up for an annual wellness visit (not a visit with me) with one of our nurses, Cassie or Manuela Schwartz or Roselyn Reef, who both specialize in the annual wellness visit. This is a free benefit under medicare that may help [place] find additional ways to help you. Some highlights are reviewing medications, lifestyle, and doing a dementia screen.    Please stop by lab before you go

## 2018-03-20 NOTE — Progress Notes (Signed)
Phone: [phone]  Subjective:  Patient presents today for their annual physical. Chief complaint-noted.   See problem oriented charting- ROS- full  review of systems was completed and negative except for: right sided pain that has hurt since her surgery though worse over last week (now improving)  The following were reviewed and entered/updated in epic: Past Medical History:  Diagnosis Date  . Arthritis    ddd  . Chronic headaches    imprved recently per patient   . Emphysema of lung ([place])    asymptomatic. noted on CT  . History of adenomatous polyp of colon    2015 precancerous polyp history- due spring 2020.  5 years  . History of seizures    1980s few ??? seizure activity  (vagal response)  . Hypertension   . Palpitations   . RBBB (right bundle branch block)    palpitations  . Skin cancer    skin basal cell cancer   Patient Active Problem List   Diagnosis Date Noted  . History of atrial fibrillation 12/29/2017    Priority: High  . Alcoholism in remission ([place]) 12/29/2017    Priority: High  . Emphysema of lung ([place])     Priority: High  . S/P lobectomy of lung 09/16/2017    Priority: High  . Lung cancer, Right lower lobe ([place]) 08/06/2017    Priority: High  . Hypertension 12/29/2017    Priority: Medium  . History of skin cancer 12/29/2017    Priority: Medium  . Chronic headaches     Priority: Medium  . Left thyroid nodule 10/07/2017    Priority: Medium  . Palpitations 03/25/2017    Priority: Medium  . Lens replaced by other means 08/12/2017    Priority: Low  . Tear film insufficiency 08/12/2017    Priority: Low  . Vitreous degeneration 08/12/2017    Priority: Low  . Cervicogenic headache 08/11/2017    Priority: Low  . DDD (degenerative disc disease), cervical 08/11/2017    Priority: Low  . Arthritis of facet joint of cervical spine 07/28/2017    Priority: Low  . RBBB 07/11/2015    Priority: Low  . Senile nuclear sclerosis 12/25/2011    Priority:  Low  . Myopia 12/10/2011    Priority: Low   Past Surgical History:  Procedure Laterality Date  . BLADDER SURGERY  1985   Long term incontinence. [facility]- used small intestine to build bladder  . BREAST BIOPSY  2001  . CATARACT EXTRACTION W/ INTRAOCULAR LENS  IMPLANT, BILATERAL    . Intestinal blockage  1989 and 1990   bowel obstruction surgery  . Lung cancer removal   08/2017   Right lower lobe  . VIDEO ASSISTED THORACOSCOPY (VATS)/WEDGE RESECTION Right 09/16/2017   Procedure: VIDEO ASSISTED THORACOSCOPY (VATS)/LUNG RESECTION;  Surgeon: [name], MD;  Location: [facility];  Service: Thoracic;  Laterality: Right;  Marland Kitchen VIDEO BRONCHOSCOPY N/A 09/16/2017   Procedure: VIDEO BRONCHOSCOPY;  Surgeon: [name], MD;  Location: [facility] OR;  Service: Thoracic;  Laterality: N/A;  . WRIST SURGERY     de quervains tenosynovitis. Dr. Maureen Ralphs actually did this.    Family History  Problem Relation Age of Onset  . Dementia Mother   . Heart attack Mother        age 75. after hip fracture.   . Hypertension Mother   . Prostate cancer Father   . Hypertension Father   . Breast cancer Sister   . Hypertension Sister   . Heart attack  Sister        48. former smoker  . Heart attack Paternal Grandfather   . Hypertension Sister     Medications- reviewed and updated Current Outpatient Medications  Medication Sig Dispense Refill  . metoprolol tartrate (LOPRESSOR) 25 MG tablet Take 1 tablet (25 mg total) by mouth 2 (two) times daily. 180 tablet 3  . Multiple Vitamins-Minerals (CENTRUM ADULTS PO) Take 1 tablet by mouth daily.    Marland Kitchen Propylene Glycol-Glycerin (SOOTHE OP) Place 1 drop into both eyes 2 (two) times daily as needed (dry eyes).    . zonisamide (ZONEGRAN) 25 MG capsule Take 25 mg by mouth 2 (two) times daily.     No current facility-administered medications for this visit.     Allergies-reviewed and updated Allergies  Allergen Reactions  . Codeine Nausea And Vomiting    Social History     Social History Narrative   Single. Lives alone.    Lived in [place] for ome years to help with parents but otherwise in [place] most of life.       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 Oropharynx normal Neck: no thyromegaly CV: RRR no murmurs rubs or gallops Breasts: normal appearance, no masses or tenderness Lungs: CTAB no crackles, wheeze, rhonchi Abdomen: soft/nontender- other than bandlike tenderness along bottom of rigbs from abdomen around to back/nondistended/normal bowel sounds. No rebound or guarding.  Ext: no edema Skin: warm, dry Neuro: grossly normal, moves all extremities, PERRLA  Assessment/Plan:  67 y.o. female presenting for annual physical.  Health Maintenance counseling: 1. Anticipatory guidance: Patient counseled regarding regular dental exams -q6 months, eye exams - yearly, wearing seatbelts.  2. Risk factor reduction:  Advised patient of need for regular exercise and diet rich and fruits and vegetables to reduce risk of heart attack and stroke. Exercise- walk 2-3 days per week for 2-3 miles plus water aerobics at [facility]. Diet-reasonably balanced.  Wt Readings from Last 3 Encounters:  03/20/18 143 lb (64.9 kg)  03/19/18 145 lb (65.8 kg)  01/22/18 145 lb (65.8 kg)   3. Immunizations/screenings/ancillary studies-she is on Shingrix waiting list- had chance to get it yesterday but due to steroids was declined. Move shingrix out beyond your steroid shots since it is not as time sensitive as the flu shot and you already had the old  shingles shot.   Flu shottoday  Immunization History  Administered Date(s) Administered  . Influenza Split 05/09/2011, 04/26/2012, 03/24/2014, 04/22/2014  . Influenza Whole 03/19/2017  . Influenza, High Dose Seasonal PF 04/24/2016  . Influenza, Seasonal, Injecte, Preservative Fre 04/11/2015  . Influenza-Unspecified 03/31/2017  . PPD Test 09/13/2014  . Pneumococcal Conjugate-13 06/15/2015  . Pneumococcal Polysaccharide-23 03/19/2017  . Tdap 09/15/2014  . Zoster 06/11/2013   Health Maintenance Due  Topic Date Due  . MAMMOGRAM - can see done 2/19 but need the actual report- see colonoscopy section below 09/15/1999  . COLONOSCOPY - brought in her report and we will abstract in 09/15/1999  4. Cervical cancer screening-  passed age based screening. Denies history of abnormal 5. Breast cancer screening-  breast exam today - reassuring- and mammogram  Had 07/2017- get records 6. Colon cancer screening - 12/31/13 per [facility]. Was told 5 years due to uncle and 1st cousins daughter. She reports multiple polyps in past- we dont have report of those polyps. She states at one point she was on q3 years.  7. Skin cancer screening- Skin surgery center yearly. advised regular sunscreen use. Denies worrisome, changing, or new skin lesions.  8. Birth control/STD check- postmenopausal. Not sexually active 9. Osteoporosis screening at 26-  osteopenia noted 2017- to abstract this in. Worst t score -2.0 in lumbar spine- likely repeat next year. Does multivitamin with calcium and vitamin D.  10. Depression screening- done 12/29/17 negative 11. Former smoker- not candidate for lung cancer screening as in lung cancer follow up 12. also please abstract negative hepatitis c on 02/20/16- found through care everywhere  Status of chronic or acute concerns   Hypertension-controlled on metoprolol 25 mg twice daily  Paroxysmal atrial fibrillation- one episode during her lung cancer resection- she was treated with  amiodarone and converted back to sinus rhythm.  She is now off amiodarone.  Since atrial fibrillation thought due to surgery only she has not been placed on anticoagulation  Alcoholism-remains in remission.  Quit 1981  History of right lower lobe lung cancer (non-small cell lung cancer, squamous cell carcinoma per oncology notes)-status post surgery for lung cancer resection.  Follows with cardiothoracic surgery/6 -having some right flank pain near site of normal post surgical pain 3/6. Pain had flared up but now trending back to 5-6/10. Does have some arthritis in neck- they wondered if that couldbe the source- had some shots in neck and shoulders- headache better but wonder if pain could be related to injections  Emphysema of lung-asymptomatic still, only noted on CT  Future Appointments  Date Time Provider [place]  03/30/2018  2:30 PM Daune Perch, NP [facility]  05/04/2018  1:00 PM [facility] 6 [facility] None  05/06/2018 10:45 AM Curt Bears, MD [facility] None  06/10/2018 11:20 AM Nahser, Wonda Cheng, MD [facility]  07/30/2018 11:30 AM [name], MD [facility]   Return in about 6 months (around 09/18/2018) for follow up- or sooner if needed.  Lab/Order associations: Preventative health care - Plan: CBC, Comprehensive metabolic panel, Lipid panel, Urinalysis, CANCELED: Hepatitis C antibody  Essential hypertension - Plan: CBC, Comprehensive metabolic panel, Lipid panel, Urinalysis  Encounter for HCV screening test for low risk patient - Plan: CANCELED: Hepatitis C antibody  Return precautions advised.  Garret Reddish, MD

## 2018-03-30 ENCOUNTER — Ambulatory Visit: Payer: Medicare Other | Admitting: Cardiology

## 2018-03-30 ENCOUNTER — Encounter: Payer: Self-pay | Admitting: Cardiology

## 2018-03-30 VITALS — BP 122/70 | HR 61 | Ht 67.0 in | Wt 145.0 lb

## 2018-03-30 DIAGNOSIS — I10 Essential (primary) hypertension: Secondary | ICD-10-CM | POA: Diagnosis not present

## 2018-03-30 DIAGNOSIS — Z8679 Personal history of other diseases of the circulatory system: Secondary | ICD-10-CM | POA: Diagnosis not present

## 2018-03-30 NOTE — Progress Notes (Signed)
Cardiology Office Note:    Date:  03/30/2018   ID:  Margaret Charles, DOB 1951-06-05, MRN [id number]  PCP:  [name], MD  Cardiologist:  Mertie Moores, MD  Referring MD: [name], MD   Chief Complaint  Patient presents with  . Follow-up    Afib    History of Present Illness:    Margaret Charles is a 67 y.o. female with a past medical history significant for RBBB, hypertension, lung cancer s/p lung resection on 08/2017 with postop A. Fib.  The patient developed atrial fibrillation with her right lower back to me.  She was placed on amiodarone and metoprolol with conversion back to sinus rhythm.  She was seen by Dr. Acie Fredrickson in 11/2017 with no further episodes of A. fib and so her amiodarone was discontinued.  She was continued on metoprolol.  She is here today for follow-up. She is still sore in her right side after her right lower lobectomy. She has also been having headaches due to cervical neck and she has been getting neck injections. Her side pain increased after neck injections. She went to see Dr. Servando Snare about her pain and everything was normal. Her headaches are better.   She denies any central chest discomfort, shortness of breath, palpitations or any perceived afib recurrence. She walked a 5k on Saturday and did well. [facility]   She did not have high blood pressure until her lung cancer and surgery in February. Her BP seems to be settling down.   Past Medical History:  Diagnosis Date  . Arthritis    ddd  . Chronic headaches    imprved recently per patient   . Emphysema of lung ([place])    asymptomatic. noted on CT  . History of adenomatous polyp of colon    2015 precancerous polyp history- due spring 2020.  5 years  . History of seizures    1980s few ??? seizure activity  (vagal response)  . Hypertension   . Palpitations   . RBBB (right bundle branch block)    palpitations  . Skin cancer    skin basal cell cancer     Past Surgical History:  Procedure Laterality Date  . BLADDER SURGERY  1985   Long term incontinence. [facility]- used small intestine to build bladder  . BREAST BIOPSY  2001  . CATARACT EXTRACTION W/ INTRAOCULAR LENS  IMPLANT, BILATERAL    . Intestinal blockage  1989 and 1990   bowel obstruction surgery  . Lung cancer removal   08/2017   Right lower lobe  . VIDEO ASSISTED THORACOSCOPY (VATS)/WEDGE RESECTION Right 09/16/2017   Procedure: VIDEO ASSISTED THORACOSCOPY (VATS)/LUNG RESECTION;  Surgeon: [name], MD;  Location: [facility];  Service: Thoracic;  Laterality: Right;  Marland Kitchen VIDEO BRONCHOSCOPY N/A 09/16/2017   Procedure: VIDEO BRONCHOSCOPY;  Surgeon: [name], MD;  Location: [facility] OR;  Service: Thoracic;  Laterality: N/A;  . WRIST SURGERY     de quervains tenosynovitis. Dr. Maureen Ralphs actually did this.     Current Medications: Current Meds  Medication Sig  . metoprolol tartrate (LOPRESSOR) 25 MG tablet Take 1 tablet (25 mg total) by mouth 2 (two) times daily.  . Multiple Vitamins-Minerals (CENTRUM ADULTS PO) Take 1 tablet by mouth daily.  Marland Kitchen Propylene Glycol-Glycerin (SOOTHE OP) Place 1 drop into both eyes 2 (two) times daily as needed (dry eyes).  . zonisamide (ZONEGRAN) 25 MG capsule Take 25 mg by mouth  2 (two) times daily.     Allergies:   Codeine   Social History   Socioeconomic History  . Marital status: Single    Spouse name: Not on file  . Number of children: Not on file  . Years of education: Not on file  . Highest education level: Not on file  Occupational History  . Occupation: Retired Tour manager  . Financial resource strain: Not on file  . Food insecurity:    Worry: Not on file    Inability: Not on file  . Transportation needs:    Medical: Not on file    Non-medical: Not on file  Tobacco Use  . Smoking status: Former Smoker    Packs/day: 1.00    Years: 40.00    Pack years: 40.00    Types: Cigarettes    Last attempt to quit: 12/03/2008     Years since quitting: 9.3  . Smokeless tobacco: Never Used  Substance and Sexual Activity  . Alcohol use: Not Currently    Comment: hx  etoh  38 yrs  . Drug use: Never  . Sexual activity: Not Currently  Lifestyle  . Physical activity:    Days per week: Not on file    Minutes per session: Not on file  . Stress: Not on file  Relationships  . Social connections:    Talks on phone: Not on file    Gets together: Not on file    Attends religious service: Not on file    Active member of club or organization: Not on file    Attends meetings of clubs or organizations: Not on file    Relationship status: Not on file  Other Topics Concern  . Not on file  Social History Narrative   Single. Lives alone.    Lived in [place] for ome years to help with parents but otherwise in [place] most of life.       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 ROS:   Please see the history of present illness.     All other systems reviewed and are negative.  EKGs/Labs/Other Studies Reviewed:    The following studies were reviewed today:  None  EKG:  EKG is not ordered today.    Recent Labs: 09/19/2017: TSH 4.392 09/20/2017: Magnesium 1.8 03/20/2018: ALT 14; BUN 14; Creatinine, Ser 0.86; Hemoglobin 13.4; Platelets 287.0; Potassium 4.4; Sodium 138   Recent Lipid Panel    Component Value Date/Time   CHOL 175 03/20/2018 0903   TRIG 103.0 03/20/2018 0903   HDL 57.90 03/20/2018 0903   CHOLHDL 3 03/20/2018 0903   VLDL 20.6 03/20/2018 0903   LDLCALC 96 03/20/2018 0903    Physical Exam:     VS:  BP 122/70   Pulse 61   Ht 5\' 7"  (1.702 m)   Wt 145 lb (65.8 kg)   SpO2 97%   BMI 22.71 kg/m     Wt Readings from Last 3 Encounters:  03/30/18 145 lb (65.8 kg)  03/20/18 143 lb (64.9 kg)  03/19/18 145 lb (65.8 kg)  Physical Exam  Constitutional: She is oriented to person, place, and time. She appears well-developed and well-nourished. No distress.  HENT:  Head: Normocephalic and atraumatic.  Neck: Normal range of motion. Neck supple. No JVD present.  Cardiovascular: Normal rate, regular rhythm and intact distal pulses. Exam reveals no gallop and no friction rub.  No murmur heard. Pulmonary/Chest: Effort normal and breath sounds normal. No respiratory distress. She has no wheezes. She has no rales.  Abdominal: Soft. Bowel sounds are normal.  Musculoskeletal: Normal range of motion. She exhibits no edema or deformity.  Neurological: She is alert and oriented to person, place, and time.  Skin: Skin is warm and dry.  Psychiatric: She has a normal mood and affect. Her behavior is normal. Judgment and thought content normal.  Vitals reviewed.   ASSESSMENT:    1. History of atrial fibrillation   2. Essential hypertension    PLAN:    In order of problems listed above:  Paroxysmal atrial fibrillation: The patient had transient A. fib postoperatively, treated with amiodarone which has now been stopped in the setting of no further A. fib recurrences.  She continues on metoprolol with continued no perceived palpitations or afib recurrences. She has a follow up scheduled with Dr. Acie Fredrickson in December and she will keep this.   Hypertension: On metoprolol. Well controlled. Continue current therapy.   Medication Adjustments/Labs and Tests Ordered: Current medicines are reviewed at length with the patient today.  Concerns regarding medicines are outlined above. Labs and tests ordered and medication changes are outlined in the patient instructions below:  Patient Instructions   Medication Instructions:  Your physician recommends that you continue on your current medications as directed. Please refer to the Current Medication list given to you today.   If you need a refill on your cardiac medications before your next appointment, please call your pharmacy.   Lab work: None  Testing/Procedures: None  Follow-Up: Please keep follow up appointment with Dr. Acie Fredrickson on 06/10/18 @ 11:20 AM  Any Other Special Instructions Will Be Listed Below (If Applicable).       Signed, Daune Perch, NP  03/30/2018 4:01 PM    [facility]

## 2018-03-30 NOTE — Patient Instructions (Addendum)
Medication Instructions:  Your physician recommends that you continue on your current medications as directed. Please refer to the Current Medication list given to you today.   If you need a refill on your cardiac medications before your next appointment, please call your pharmacy.   Lab work: None  Testing/Procedures: None  Follow-Up: Please keep follow up appointment with Dr. Acie Fredrickson on 06/10/18 @ 11:20 AM  Any Other Special Instructions Will Be Listed Below (If Applicable).

## 2018-04-03 ENCOUNTER — Encounter: Payer: Self-pay | Admitting: Family Medicine

## 2018-05-04 ENCOUNTER — Ambulatory Visit (HOSPITAL_COMMUNITY)
Admission: RE | Admit: 2018-05-04 | Discharge: 2018-05-04 | Disposition: A | Discharge status: Home / Self Care | Payer: Medicare Other | Source: Ambulatory Visit | Attending: Internal Medicine | Admitting: Internal Medicine

## 2018-05-04 ENCOUNTER — Inpatient Hospital Stay: Payer: Medicare Other | Attending: Internal Medicine

## 2018-05-04 DIAGNOSIS — C3490 Malignant neoplasm of unspecified part of unspecified bronchus or lung: Secondary | ICD-10-CM

## 2018-05-04 DIAGNOSIS — J90 Pleural effusion, not elsewhere classified: Secondary | ICD-10-CM | POA: Diagnosis not present

## 2018-05-04 DIAGNOSIS — R079 Chest pain, unspecified: Secondary | ICD-10-CM | POA: Diagnosis not present

## 2018-05-04 DIAGNOSIS — C3431 Malignant neoplasm of lower lobe, right bronchus or lung: Secondary | ICD-10-CM | POA: Insufficient documentation

## 2018-05-04 DIAGNOSIS — Z79899 Other long term (current) drug therapy: Secondary | ICD-10-CM | POA: Diagnosis not present

## 2018-05-04 LAB — CMP (CANCER CENTER ONLY)
ALK PHOS: 103 U/L (ref 38–126)
ALT: 17 U/L (ref 0–44)
ANION GAP: 7 (ref 5–15)
AST: 18 U/L (ref 15–41)
Albumin: 3.7 g/dL (ref 3.5–5.0)
BUN: 11 mg/dL (ref 8–23)
CALCIUM: 9.1 mg/dL (ref 8.9–10.3)
CO2: 25 mmol/L (ref 22–32)
CREATININE: 0.78 mg/dL (ref 0.44–1.00)
Chloride: 108 mmol/L (ref 98–111)
GFR, Estimated: >60 mL/min (ref >60)
Glucose, Bld: 89 mg/dL (ref 70–99)
Potassium: 4.6 mmol/L (ref 3.5–5.1)
SODIUM: 140 mmol/L (ref 135–145)
TOTAL PROTEIN: 6.7 g/dL (ref 6.5–8.1)
Total Bilirubin: 0.4 mg/dL (ref 0.3–1.2)

## 2018-05-04 LAB — CBC WITH DIFFERENTIAL (CANCER CENTER ONLY)
Abs Immature Granulocytes: 0.02 10*3/uL (ref 0.00–0.07)
BASOS ABS: 0 10*3/uL (ref 0.0–0.1)
BASOS PCT: 0 %
EOS PCT: 2 %
Eosinophils Absolute: 0.2 10*3/uL (ref 0.0–0.5)
HEMATOCRIT: 36.8 % (ref 36.0–46.0)
Hemoglobin: 12.4 g/dL (ref 12.0–15.0)
IMMATURE GRANULOCYTES: 0 %
LYMPHS ABS: 2.7 10*3/uL (ref 0.7–4.0)
Lymphocytes Relative: 27 %
MCH: 30.8 pg (ref 26.0–34.0)
MCHC: 33.7 g/dL (ref 30.0–36.0)
MCV: 91.3 fL (ref 80.0–100.0)
MONOS PCT: 7 %
Monocytes Absolute: 0.7 10*3/uL (ref 0.1–1.0)
NEUTROS PCT: 64 %
NRBC: 0 % (ref 0.0–0.2)
Neutro Abs: 6.3 10*3/uL (ref 1.7–7.7)
PLATELETS: 289 10*3/uL (ref 150–400)
RBC: 4.03 MIL/uL (ref 3.87–5.11)
RDW: 12.7 % (ref 11.5–15.5)
WBC Count: 10 10*3/uL (ref 4.0–10.5)

## 2018-05-04 MED ORDER — IOHEXOL 300 MG/ML  SOLN
75.0000 mL | Freq: Once | INTRAMUSCULAR | Status: AC | PRN
  Administered 2018-05-04: 75 mL via INTRAVENOUS

## 2018-05-04 MED ORDER — SODIUM CHLORIDE (PF) 0.9 % IJ SOLN
INTRAMUSCULAR | Status: AC
  Filled 2018-05-04: qty 50

## 2018-05-06 ENCOUNTER — Telehealth: Payer: Self-pay | Admitting: Internal Medicine

## 2018-05-06 ENCOUNTER — Inpatient Hospital Stay: Payer: Medicare Other | Admitting: Internal Medicine

## 2018-05-06 ENCOUNTER — Encounter: Payer: Self-pay | Admitting: Internal Medicine

## 2018-05-06 DIAGNOSIS — R05 Cough: Secondary | ICD-10-CM | POA: Diagnosis not present

## 2018-05-06 DIAGNOSIS — J90 Pleural effusion, not elsewhere classified: Secondary | ICD-10-CM

## 2018-05-06 DIAGNOSIS — R079 Chest pain, unspecified: Secondary | ICD-10-CM

## 2018-05-06 DIAGNOSIS — C3431 Malignant neoplasm of lower lobe, right bronchus or lung: Secondary | ICD-10-CM | POA: Diagnosis not present

## 2018-05-06 DIAGNOSIS — C3490 Malignant neoplasm of unspecified part of unspecified bronchus or lung: Secondary | ICD-10-CM

## 2018-05-06 NOTE — Telephone Encounter (Signed)
Scheduled appt per 11/13 los - gave patient AVS and calender per los.   

## 2018-05-06 NOTE — Progress Notes (Signed)
[name] [phone]   [phone]  OFFICE PROGRESS NOTE  [name], MD [place] [address]  DIAGNOSIS: Stage IA (T1b, N0, M0) non-small cell lung cancer, squamous cell carcinoma  PRIOR THERAPY:  status post right lower lobectomy with lymph node dissection under the care of Dr. Servando Snare on September 16, 2017.  CURRENT THERAPY: Observation.  INTERVAL HISTORY: Margaret Charles 67 y.o. female returns to the clinic today for follow-up visit.  The patient is feeling fine today with no concerning complaints except for intermittent right-sided chest pain as well as dry cough.  She has postnasal drainage and history of acid reflux.  She denied having any shortness of breath or hemoptysis.  She denied having any recent weight loss or night sweats.  She has no nausea, vomiting, diarrhea or constipation.  She is here today for evaluation with repeat CT scan of the chest for restaging of her disease.  MEDICAL HISTORY: Past Medical History:  Diagnosis Date  . Arthritis    ddd  . Chronic headaches    imprved recently per patient   . Emphysema of lung ([place])    asymptomatic. noted on CT  . History of adenomatous polyp of colon    2015 precancerous polyp history- due spring 2020.  5 years  . History of seizures    1980s few ??? seizure activity  (vagal response)  . Hypertension   . Palpitations   . RBBB (right bundle branch block)    palpitations  . Skin cancer    skin basal cell cancer    ALLERGIES:  is allergic to codeine.  MEDICATIONS:  Current Outpatient Medications  Medication Sig Dispense Refill  . metoprolol tartrate (LOPRESSOR) 25 MG tablet Take 1 tablet (25 mg total) by mouth 2 (two) times daily. 180 tablet 3  . Multiple Vitamins-Minerals (CENTRUM ADULTS PO) Take 1 tablet by mouth daily.    Marland Kitchen Propylene Glycol-Glycerin (SOOTHE OP) Place 1 drop into both eyes 2 (two) times daily as needed (dry eyes).    .  zonisamide (ZONEGRAN) 25 MG capsule Take 25 mg by mouth 2 (two) times daily.     No current facility-administered medications for this visit.     SURGICAL HISTORY:  Past Surgical History:  Procedure Laterality Date  . BLADDER SURGERY  1985   Long term incontinence. [facility]- used small intestine to build bladder  . BREAST BIOPSY  2001  . CATARACT EXTRACTION W/ INTRAOCULAR LENS  IMPLANT, BILATERAL    . Intestinal blockage  1989 and 1990   bowel obstruction surgery  . Lung cancer removal   08/2017   Right lower lobe  . VIDEO ASSISTED THORACOSCOPY (VATS)/WEDGE RESECTION Right 09/16/2017   Procedure: VIDEO ASSISTED THORACOSCOPY (VATS)/LUNG RESECTION;  Surgeon: [name], MD;  Location: [facility];  Service: Thoracic;  Laterality: Right;  Marland Kitchen VIDEO BRONCHOSCOPY N/A 09/16/2017   Procedure: VIDEO BRONCHOSCOPY;  Surgeon: [name], MD;  Location: [facility] OR;  Service: Thoracic;  Laterality: N/A;  . WRIST SURGERY     de quervains tenosynovitis. Dr. Maureen Ralphs actually did this.     REVIEW OF SYSTEMS:  A comprehensive review of systems was negative except for: Respiratory: positive for cough   PHYSICAL EXAMINATION: General appearance: alert, cooperative and no distress Head: Normocephalic, without obvious abnormality, atraumatic Neck: no adenopathy, no JVD, supple, symmetrical, trachea midline and thyroid not enlarged, symmetric, no tenderness/mass/nodules Lymph nodes: Cervical, supraclavicular, and axillary nodes normal. Resp: clear  to auscultation bilaterally Back: symmetric, no curvature. ROM normal. No CVA tenderness. Cardio: regular rate and rhythm, S1, S2 normal, no murmur, click, rub or gallop GI: soft, non-tender; bowel sounds normal; no masses,  no organomegaly Extremities: extremities normal, atraumatic, no cyanosis or edema  ECOG PERFORMANCE STATUS: 1 - Symptomatic but completely ambulatory  Blood pressure (!) 141/81, pulse 64, temperature 98.1 F (36.7 C), temperature source  Oral, resp. rate (!) 22, height 5\' 7"  (1.702 m), weight 145 lb 3.2 oz (65.9 kg), SpO2 100 %.  LABORATORY DATA: Lab Results  Component Value Date   WBC 10.0 05/04/2018   HGB 12.4 05/04/2018   HCT 36.8 05/04/2018   MCV 91.3 05/04/2018   PLT 289 05/04/2018      Chemistry      Component Value Date/Time   NA 140 05/04/2018 1240   K 4.6 05/04/2018 1240   CL 108 05/04/2018 1240   CO2 25 05/04/2018 1240   BUN 11 05/04/2018 1240   CREATININE 0.78 05/04/2018 1240      Component Value Date/Time   CALCIUM 9.1 05/04/2018 1240   ALKPHOS 103 05/04/2018 1240   AST 18 05/04/2018 1240   ALT 17 05/04/2018 1240   BILITOT 0.4 05/04/2018 1240       RADIOGRAPHIC STUDIES: Ct Chest W Contrast  Result Date: 05/04/2018 CLINICAL DATA:  RIGHT lung cancer diagnosed February 2019. RIGHT lower lobectomy. EXAM: CT CHEST WITH CONTRAST TECHNIQUE: Multidetector CT imaging of the chest was performed during intravenous contrast administration. CONTRAST:  28mL OMNIPAQUE IOHEXOL 300 MG/ML  SOLN COMPARISON:  PET scan 08/25/2017 FINDINGS: Cardiovascular: 4 cm ascending aorta. No significant vascular findings. Normal heart size. No pericardial effusion. Mediastinum/Nodes: No axillary or supraclavicular adenopathy. No mediastinal adenopathy. Esophagus normal Lungs/Pleura: RIGHT lower lobectomy. Small moderate RIGHT effusion. No nodularity in the RIGHT lung. Several benign-appearing nodules are present along the superior aspect of the RIGHT oblique fissure. The LEFT lung is clear. Upper Abdomen: Limited view of the liver, kidneys, pancreas are unremarkable. Normal adrenal glands. Musculoskeletal: No aggressive osseous lesion IMPRESSION: 1. No evidence of RIGHT lung cancer recurrence following RIGHT lower lobectomy. 2. RIGHT lower lobe effusion is new from prior. 3. No evidence of metastatic adenopathy. Electronically Signed   By: Suzy Bouchard M.D.   On: 05/04/2018 17:17    ASSESSMENT AND PLAN: This is a very pleasant  67 years old white female with a stage Ia non-small cell lung cancer status post right lower lobectomy with lymph node dissection in March 2019 under the care of Dr. Servando Snare.  The patient is currently on observation and she is feeling fine with no concerning complaints except for intermittent right-sided chest pain as well as dry cough. She had repeat CT scan of the chest performed recently.  I personally and independently reviewed the scan images and discussed the results with the patient today.  Her scan showed no concerning findings for disease recurrence but there was development of a small new right pleural effusion.  This could be secondary to her previous surgery but disease recurrence would not be completely excluded at this point. I recommended for the patient to continue on observation with repeat CT scan of the chest in 6 months for restaging of her disease. She will see Dr. Servando Snare in February 2020. The patient was advised to call immediately if she has any concerning symptoms in the interval. The patient voices understanding of current disease status and treatment options and is in agreement with the current care plan.  All questions were answered. The patient knows to call the clinic with any problems, questions or concerns. We can certainly see the patient much sooner if necessary.  I spent 10 minutes counseling the patient face to face. The total time spent in the appointment was 15 minutes.  Disclaimer: This note was dictated with voice recognition software. Similar sounding words can inadvertently be transcribed and may not be corrected upon review.

## 2018-05-14 NOTE — Research (Signed)
LATE ENTRY: Visit occurred on September 09, 2017, CRC did not document in Epic on day of visit, but paper source was completed and filed on day of visit in paper source binder.   Title: Blood Sample Collection in Subjects with Pulmonary Nodules or CT Suspicion of Lung  Cancer  Study Number: 2016-01; Sponsor: [employer] [place] [id number]  Principal Investigator: Dr. Marshell Garfinkel ; Sub Investigators: Dr. Brand Males, Dr. Simonne Maffucci  Synopsis: This is multi-site, sample collection study.The study is to obtain de-identified, clinically characterized, whole blood specimens for use in assessing new biomarkers for the detection of neoplasms off the lung.  The study will sample blood (59mL) from approximately 2250 subject; 1000 will have CT suspicion of lung cancer which is ultimately diagnosed as lung cancer and approximately 1000 subjects will have pulmonary nodules greater than or equal to 4 mm .  Total Number of Subjects in Trial: 2250   CT suspicion of lung Cancer ultimately diagnosed  CT suspicion of lung Cancer ultimately benign Pulmonary  Nodules Greater than/equal to 4 mm  Pulmonary Nodules Greater than/equal to 15 mm Pulmonary Nodules Greater than/equal to 10-15 mm  Greater than/equal to 4-9 mm        Number of Subjects 1000 250 1000 100 100 Remaining      Key Inclusion:  Suspicion of Cancer Subjects:  Subject is female or female, 47-42 years of age, inclusive  Subject has CT suspicion of lung cancer and is scheduled for biopsy or other diagnostic procedure.  Pulmonary Nodule Subjects:  Subject is female or female, 40-71 years of age, inclusive  Subject has a recent (within 90 days of enrollment) CT radiological diagnosis of pulmonary nodule(s) greater than equal to 4 mm without a scheduled biopsy/diagnostic procedure.   All Subjects:  Subject understands the study procedures and is able to provide informed consent to participate in the study and  authorization for release of relevant protected health information to the study investigator.  Key Exclusion:  CT with IV contrast within 1 day (or 24 hours) of blood collection  Prior history of cancer with the exception of non melanoma skin cancer and most in-situ carcinomas.  Prior removal of the lung, excluding percutaneous lung biopsy.  Any cytotoxic therapy including chemotherapy and radiation therapy.           Key Features: Lung cancer is now the most common cause of cancer death among men and women.  Key Endpoints: Using whole blood specimens for use in assessing new biomarkers for detection of neoplasms of the lung and for a biorepository for future cancer-related diagnostic test development.  Safety of [employer]: Routine phlebotomy risks of minimal transient pain and possible hematoma formation  Fleischner Society Guidelines for incidental pulmonary nodule follow up:   Nodule Size(mm) Low- Risk Patient High Risk Patient  ? 4 No Follow-up Needed Follow-up at 12 mo; if unchanged,no further follow up   > 4-6 Follow-up CT at 12 months;   if unchanged, no further follow up  Initial follow up CT at 6-12 mo;then at 18-24 months if no change  > 6-8  Initial follow-up CT at 6-12 months then at 18-24 months if no change       Initial follow-up CT at 3-66mos then at 9-12 and 24 months if no change  > 8 Follow-up CT at around 3, 9, and 24 months, dynamic contrast- enhanced CT, PET, and/or biopsy Same as for low-risk patient     Clinical Research Coordinator /  Research RN note : This visit for Subject Margaret Charles with DOB: 13-Apr-1951 on 09/09/2017 for the above protocol is Visit/Encounter # screening and enrollment and is for purpose of research . Subject confirmed that there was no change in contact information (e.g. address, telephone, email). Subject thanked for participation in research and contribution to science.   The subject was consented to participate in the above mentioned  trial, refer to the informed consent form documentation checklist for further details. Sub-I Dr. Brand Males was present for the consenting process. All procedures completed per that above mentioned protocol. Refer to the subjects paper source binder for further details of the visit.   Signed by Rosser Bing, [place] Coordinator [facility]  [place], [place] 3:19 PM 05/14/2018

## 2018-05-22 ENCOUNTER — Ambulatory Visit: Payer: Self-pay

## 2018-05-22 NOTE — Telephone Encounter (Signed)
This encounter was created in error - please disregard.

## 2018-05-22 NOTE — Telephone Encounter (Signed)
Pt c/o vertigo that began yesterday.  Pt stated that the room is spinning. Pt stated sitting helps with diminishing the symptoms. Bending over makes the vertigo worse. Pt rated the vertigo as mild. Pt stated she is having a mild headache. Pt stated she thinks the cause is cervicogenic headache. Pt stated that she has been evaluated before and after she had her ears flushed out, her symptoms improved.Pt c/o nausea and vomiting in the morning. Pt stated she is now able to keep food and fluids down. Pt denies weakness, earache or weakness.  Care advice given and pt verbalized understanding. Appointment given for Monday at 2:45 with PCP.  Reason for Disposition . [1] MILD dizziness (e.g., vertigo; walking normally) AND [2] has been evaluated by physician for this  Answer Assessment - Initial Assessment Questions 1. DESCRIPTION: "Describe your dizziness."     Room is spinning until can settle in 1 place  2. VERTIGO: "Do you feel like either you or the room is spinning or tilting?"      yes 3. LIGHTHEADED: "Do you feel lightheaded?" (e.g., somewhat faint, woozy, weak upon standing)     no 4. SEVERITY: "How bad is it?"  "Can you walk?"   - MILD - Feels unsteady but walking normally.   - MODERATE - Feels very unsteady when walking, but not falling; interferes with normal activities (e.g., school, work) .   - SEVERE - Unable to walk without falling (requires assistance).     mild 5. ONSET:  "When did the dizziness begin?"     yesterday 6. AGGRAVATING FACTORS: "Does anything make it worse?" (e.g., standing, change in head position)     Standing makes it worse and bending over 7. CAUSE: "What do you think is causing the dizziness?"     Cervicogenic  8. RECURRENT SYMPTOM: "Have you had dizziness before?" If so, ask: "When was the last time?" "What happened that time?"     Yes- earlier this year- went to the doctor and flushed out her ears 9. OTHER SYMPTOMS: "Do you have any other symptoms?" (e.g.,  headache, weakness, numbness, vomiting, earache)      Headache, nausea and vomiting 10. PREGNANCY: "Is there any chance you are pregnant?" "When was your last menstrual period?"       n/a  Protocols used: DIZZINESS - VERTIGO-A-AH

## 2018-05-25 ENCOUNTER — Encounter: Payer: Self-pay | Admitting: Family Medicine

## 2018-05-25 ENCOUNTER — Ambulatory Visit: Payer: Medicare Other | Admitting: Family Medicine

## 2018-05-25 VITALS — BP 118/76 | HR 81 | Temp 98.0°F | Ht 67.0 in | Wt 144.2 lb

## 2018-05-25 DIAGNOSIS — R51 Headache: Secondary | ICD-10-CM

## 2018-05-25 DIAGNOSIS — H8111 Benign paroxysmal vertigo, right ear: Secondary | ICD-10-CM | POA: Diagnosis not present

## 2018-05-25 DIAGNOSIS — G4486 Cervicogenic headache: Secondary | ICD-10-CM

## 2018-05-25 NOTE — Progress Notes (Signed)
Subjective:  Margaret Charles is a 67 y.o. year old very pleasant female patient who presents for/with See problem oriented charting ROS-complains of vertigo with movement, better with rest.  No fever or chills.  Has had nausea and vomiting but none recently in last 48 hours.  No chest pain or shortness of breath reported.  No palpitations reported.  Past Medical History-  Patient Active Problem List   Diagnosis Date Noted  . History of atrial fibrillation 12/29/2017    Priority: High  . Alcoholism in remission ([place]) 12/29/2017    Priority: High  . Emphysema of lung ([place])     Priority: High  . S/P lobectomy of lung 09/16/2017    Priority: High  . Lung cancer, Right lower lobe ([place]) 08/06/2017    Priority: High  . Hypertension 12/29/2017    Priority: Medium  . History of skin cancer 12/29/2017    Priority: Medium  . Chronic headaches     Priority: Medium  . Left thyroid nodule 10/07/2017    Priority: Medium  . Palpitations 03/25/2017    Priority: Medium  . Lens replaced by other means 08/12/2017    Priority: Low  . Tear film insufficiency 08/12/2017    Priority: Low  . Vitreous degeneration 08/12/2017    Priority: Low  . Cervicogenic headache 08/11/2017    Priority: Low  . DDD (degenerative disc disease), cervical 08/11/2017    Priority: Low  . Arthritis of facet joint of cervical spine 07/28/2017    Priority: Low  . RBBB 07/11/2015    Priority: Low  . Senile nuclear sclerosis 12/25/2011    Priority: Low  . Myopia 12/10/2011    Priority: Low  . Benign paroxysmal positional vertigo of right ear 05/25/2018    Medications- reviewed and updated Current Outpatient Medications  Medication Sig Dispense Refill  . metoprolol tartrate (LOPRESSOR) 25 MG tablet Take 1 tablet (25 mg total) by mouth 2 (two) times daily. 180 tablet 3  . Multiple Vitamins-Minerals (CENTRUM ADULTS PO) Take 1 tablet by mouth daily.    Marland Kitchen Propylene Glycol-Glycerin (SOOTHE OP) Place 1 drop into both eyes  2 (two) times daily as needed (dry eyes).     No current facility-administered medications for this visit.     Objective: BP 118/76 (BP Location: Left Arm, Patient Position: Sitting, Cuff Size: Large)   Pulse 81   Temp 98 F (36.7 C) (Oral)   Ht 5\' 7"  (1.702 m)   Wt 144 lb 3.2 oz (65.4 kg)   SpO2 97%   BMI 22.58 kg/m  Gen: NAD, resting comfortably Tympanic membrane normal, oropharynx normal CV: RRR no murmurs rubs or gallops Lungs: CTAB no crackles, wheeze, rhonchi Ext: no edema Skin: warm, dry Neuro: Dix-Hallpike maneuver positive to the right for nystagmus Neuro: CN II-XII intact, sensation and reflexes normal throughout, 5/5 muscle strength in bilateral upper and lower extremities. Normal finger to nose. Normal rapid alternating movements. No pronator drift. Normal romberg. Normal gait but moves slowly/cautiously in case vertigo recurds   Assessment/Plan:  Benign paroxysmal positional vertigo of right ear Cervicogenic headache - Plan: Ambulatory referral to Physical Therapy S: Patient started with vertigo on 05/21/2018 (thanksgiving morning)- had to grab wall when coming back to bed that morning- nauseous and dizzy with that. Once she laid down was able to fall asleep. Got up thanksgiving morning and had issues throughout the day - by Friday had severe headache. Had vomiting on Thursday and Friday but none since then. Episodes improve with being still  Called our nurse triage and was advised to have PCP follow up but also recommended urgent care evaluation.  Saw Dr. Harrington Challenger with [facility] on Friday- he suggested meclizine which she bought over the counter. She is not sure if she did a neurological exam- her main concern was the severity of headache- by Saturday headache lifted and she noted an aura with zig zag lines for 30 minutes. Vertigo was getting better and by Sunday she thought it was better- turned over in bed a few times and had recurrence- also has noted with bending over-  noted again last night so decided to keep appointments. Mild issues today if bends over or lifts head quickly.   She has been seeing orthopedics from cervicogenic headaches later sent to neurology- [facility] in the neck- headaches have been doing better other than the one this weekend.   Has had vertigo with ears being full of wax in past- back in July when we irrigated. Then had another episode in august- episode lasted a few hours. Has had vertigo 30 years ago in the past and has had to stay still with head in space to be careful even outside of episodes- like no roller coaster. Diagnosed as BPPV years ago but cleared up.   A/P: 67 year old female with positive Dix-Hallpike's concerning for BPPV.  We will treat patient with continued PRN meclizine as well as refer her for physical therapy.  I wonder if some of her issues could be coming from her neck as her headaches have.  She had been working with physical therapy prior to seeing neurology for trigger point injections- she may re establish with physical therapy to work on her neck as well if Dr. Domingo Cocking clears her.  I was somewhat hesitant to refer her to physical therapy for vestibular rehab due to her neck but she seemed to do well with Dix-Hallpike and I think she will do well with Epley maneuvers.  Reassuring neurological exam today-doubt stroke.  Recurrent symptoms with change of positions certainly points toward benign paroxysmal positional vertigo as cause.  He fails to improve with vestibular rehab or we get different feedback from physical therapy can consider alternate diagnosis.    Future Appointments  Date Time Provider [place]  06/10/2018 11:20 AM Nahser, Wonda Cheng, MD [facility]  07/30/2018 11:30 AM [name], MD [facility]  09/24/2018 11:00 AM [facility] CO[facility]  11/03/2018 10:00 AM [facility] LAB 6 [facility] None   11/05/2018 10:45 AM Curt Bears, MD [facility] None  03/26/2019  8:20 AM [name], MD [facility]   Lab/Order associations: Benign paroxysmal positional vertigo of right ear - Plan: Ambulatory referral to Physical Therapy  Cervicogenic headache - Plan: Ambulatory referral to Physical Therapy  Return precautions advised.  Garret Reddish, MD

## 2018-05-25 NOTE — Assessment & Plan Note (Signed)
Cervicogenic headache - Plan: Ambulatory referral to Physical Therapy S: Patient started with vertigo on 05/21/2018 (thanksgiving morning)- had to grab wall when coming back to bed that morning- nauseous and dizzy with that. Once she laid down was able to fall asleep. Got up thanksgiving morning and had issues throughout the day - by Friday had severe headache. Had vomiting on Thursday and Friday but none since then. Episodes improve with being still  Called our nurse triage and was advised to have PCP follow up but also recommended urgent care evaluation.  Saw Dr. Harrington Challenger with [facility] on Friday- he suggested meclizine which she bought over the counter. She is not sure if she did a neurological exam- her main concern was the severity of headache- by Saturday headache lifted and she noted an aura with zig zag lines for 30 minutes. Vertigo was getting better and by Sunday she thought it was better- turned over in bed a few times and had recurrence- also has noted with bending over- noted again last night so decided to keep appointments. Mild issues today if bends over or lifts head quickly.   She has been seeing orthopedics from cervicogenic headaches later sent to neurology- [facility] point [facility] in the neck- headaches have been doing better other than the one this weekend.   Has had vertigo with ears being full of wax in past- back in July when we irrigated. Then had another episode in august- episode lasted a few hours. Has had vertigo 30 years ago in the past and has had to stay still with head in space to be careful even outside of episodes- like no roller coaster. Diagnosed as BPPV years ago but cleared up.   A/P: 67 year old female with positive Dix-Hallpike's concerning for BPPV.  We will treat patient with continued PRN meclizine as well as refer her for physical therapy.  I wonder if some of her issues could be coming from her neck as her  headaches have.  She had been working with physical therapy prior to seeing neurology for trigger point injections- she may re establish with physical therapy to work on her neck as well if Dr. Domingo Cocking clears her.  I was somewhat hesitant to refer her to physical therapy for vestibular rehab due to her neck but she seemed to do well with Dix-Hallpike and I think she will do well with Epley maneuvers.  Reassuring neurological exam today-doubt stroke.  Recurrent symptoms with change of positions certainly points toward benign paroxysmal positional vertigo as cause.  He fails to improve with vestibular rehab or we get different feedback from physical therapy can consider alternate diagnosis.

## 2018-05-25 NOTE — Patient Instructions (Addendum)
Continue to take it easy for now-can use meclizine as needed-this basically dampens your body's response to the abnormal stimuli related to crystal movement from the semicircular canals  We will call you within two weeks about your referral to physical therapy/vestibular rehabilitation. If you do not hear within 3 weeks, give [place] a call.  I went ahead and put in cervicogenic headaches as a secondary diagnosis in case Dr. Domingo Cocking releases you for that type of work.  If you have new or worsening symptoms please see [place] back immediately.  Fortunately your neurological exam was reassuring today  Benign Positional Vertigo Vertigo is the feeling that you or your surroundings are moving when they are not. Benign positional vertigo is the most common form of vertigo. The cause of this condition is not serious (is benign). This condition is triggered by certain movements and positions (is positional). This condition can be dangerous if it occurs while you are doing something that could endanger you or others, such as driving. What are the causes? In many cases, the cause of this condition is not known. It may be caused by a disturbance in an area of the inner ear that helps your brain to sense movement and balance. This disturbance can be caused by a viral infection (labyrinthitis), head injury, or repetitive motion. What increases the risk? This condition is more likely to develop in:  Women.  People who are 64 years of age or older.  What are the signs or symptoms? Symptoms of this condition usually happen when you move your head or your eyes in different directions. Symptoms may start suddenly, and they usually last for less than a minute. Symptoms may include:  Loss of balance and falling.  Feeling like you are spinning or moving.  Feeling like your surroundings are spinning or moving.  Nausea and vomiting.  Blurred vision.  Dizziness.  Involuntary eye movement (nystagmus).  Symptoms can  be mild and cause only slight annoyance, or they can be severe and interfere with daily life. Episodes of benign positional vertigo may return (recur) over time, and they may be triggered by certain movements. Symptoms may improve over time. How is this diagnosed? This condition is usually diagnosed by medical history and a physical exam of the head, neck, and ears. You may be referred to a health care provider who specializes in ear, nose, and throat (ENT) problems (otolaryngologist) or a provider who specializes in disorders of the nervous system (neurologist). You may have additional testing, including:  MRI.  A CT scan.  Eye movement tests. Your health care provider may ask you to change positions quickly while he or she watches you for symptoms of benign positional vertigo, such as nystagmus. Eye movement may be tested with an electronystagmogram (ENG), caloric stimulation, the Dix-Hallpike test, or the roll test.  An electroencephalogram (EEG). This records electrical activity in your brain.  Hearing tests.  How is this treated? Usually, your health care provider will treat this by moving your head in specific positions to adjust your inner ear back to normal. Surgery may be needed in severe cases, but this is rare. In some cases, benign positional vertigo may resolve on its own in 2-4 weeks. Follow these instructions at home: Safety  Move slowly.Avoid sudden body or head movements.  Avoid driving.  Avoid operating heavy machinery.  Avoid doing any tasks that would be dangerous to you or others if a vertigo episode would occur.  If you have trouble walking or keeping your  balance, try using a cane for stability. If you feel dizzy or unstable, sit down right away.  Return to your normal activities as told by your health care provider. Ask your health care provider what activities are safe for you. General instructions  Take over-the-counter and prescription medicines only as told  by your health care provider.  Avoid certain positions or movements as told by your health care provider.  Drink enough fluid to keep your urine clear or pale yellow.  Keep all follow-up visits as told by your health care provider. This is important. Contact a health care provider if:  You have a fever.  Your condition gets worse or you develop new symptoms.  Your family or friends notice any behavioral changes.  Your nausea or vomiting gets worse.  You have numbness or a "pins and needles" sensation. Get help right away if:  You have difficulty speaking or moving.  You are always dizzy.  You faint.  You develop severe headaches.  You have weakness in your legs or arms.  You have changes in your hearing or vision.  You develop a stiff neck.  You develop sensitivity to light. This information is not intended to replace advice given to you by your health care provider. Make sure you discuss any questions you have with your health care provider. Document Released: 03/18/2006 Document Revised: 11/16/2015 Document Reviewed: 10/03/2014 Elsevier Interactive Patient Education  [employer].

## 2018-06-05 ENCOUNTER — Encounter: Payer: Self-pay | Admitting: Physical Therapy

## 2018-06-05 ENCOUNTER — Ambulatory Visit: Payer: Medicare Other | Attending: Family Medicine | Admitting: Physical Therapy

## 2018-06-05 ENCOUNTER — Other Ambulatory Visit: Payer: Self-pay

## 2018-06-05 DIAGNOSIS — H8112 Benign paroxysmal vertigo, left ear: Secondary | ICD-10-CM | POA: Diagnosis present

## 2018-06-05 DIAGNOSIS — R2681 Unsteadiness on feet: Secondary | ICD-10-CM | POA: Diagnosis present

## 2018-06-05 DIAGNOSIS — R42 Dizziness and giddiness: Secondary | ICD-10-CM | POA: Diagnosis present

## 2018-06-05 NOTE — Therapy (Signed)
[facility] [address] [place], [place], [address] Phone: [phone]   Fax:  [phone]  Physical Therapy Evaluation  Patient Details  Name: Margaret Charles MRN: [id number] Date of Birth: 10-Dec-1950 Referring Provider (PT): [name], MD   Encounter Date: 06/05/2018  PT End of Session - 06/05/18 1244    Visit Number  1    Number of Visits  5    Date for PT Re-Evaluation  07/05/18    Authorization Type  [employer] Medicare  $20 copay    PT Start Time  1152    PT Stop Time  1235    PT Time Calculation (min)  43 min    Activity Tolerance  Patient tolerated treatment well    Behavior During Therapy  [facility] for tasks assessed/performed       Past Medical History:  Diagnosis Date  . Arthritis    ddd  . Chronic headaches    imprved recently per patient   . Emphysema of lung ([place])    asymptomatic. noted on CT  . History of adenomatous polyp of colon    2015 precancerous polyp history- due spring 2020.  5 years  . History of seizures    1980s few ??? seizure activity  (vagal response)  . Hypertension   . Palpitations   . RBBB (right bundle branch block)    palpitations  . Skin cancer    skin basal cell cancer    Past Surgical History:  Procedure Laterality Date  . BLADDER SURGERY  1985   Long term incontinence. [facility]- used small intestine to build bladder  . BREAST BIOPSY  2001  . CATARACT EXTRACTION W/ INTRAOCULAR LENS  IMPLANT, BILATERAL    . Intestinal blockage  1989 and 1990   bowel obstruction surgery  . Lung cancer removal   08/2017   Right lower lobe  . VIDEO ASSISTED THORACOSCOPY (VATS)/WEDGE RESECTION Right 09/16/2017   Procedure: VIDEO ASSISTED THORACOSCOPY (VATS)/LUNG RESECTION;  Surgeon: [name], MD;  Location: [facility];  Service: Thoracic;  Laterality: Right;  Marland Kitchen VIDEO BRONCHOSCOPY N/A 09/16/2017   Procedure: VIDEO BRONCHOSCOPY;  Surgeon: [name], MD;  Location: [facility] OR;   Service: Thoracic;  Laterality: N/A;  . WRIST SURGERY     de quervains tenosynovitis. Dr. Maureen Ralphs actually did this.     There were no vitals filed for this visit.   Subjective Assessment - 06/05/18 1157    Subjective  On Thanksgiving pt woke up to go to bathroom when she began to feel severe spinning, denies fall.  Had an episode back in August but it only lasted a few hours.  This episode lasted >1 day and resulted in severe headache with auras.  Now pt is having symptoms when rolling in bed, supine > sit and leaning back to put eye drops.    Pertinent History  facet joint impairment, cervicogenic headaches - receiving trigger point injections, migraines,     Patient Stated Goals  To not be dizzy    Currently in Pain?  Yes    Pain Score  3     Pain Location  Head    Pain Orientation  Posterior    Pain Descriptors / Indicators  Headache    Pain Type  Chronic pain         [facility] PT Assessment - 06/05/18 1203      Assessment   Medical Diagnosis  vertigo    Referring Provider (PT)  Brayton Mars  Yong Channel, MD    Onset Date/Surgical Date  05/21/18    Prior Therapy  yes for cervical facet issues.  Is going to a headache specialist for trigger point injections in the neck/shoulders for headache management      Precautions   Precautions  Other (comment)    Precaution Comments  lung cancer      Balance Screen   Has the patient fallen in the past 6 months  No      [place] residence    Living Arrangements  Alone    Type of [place] to enter    Entrance Stairs-Number of Steps  1    [place]  One level      Prior Function   Level of Independence  Independent    Leisure  deep water aerobic      Observation/Other Assessments   Focus on Therapeutic Outcomes (FOTO)   not assessed      Sensation   Light Touch  Appears Intact           Vestibular Assessment - 06/05/18 1207      Vestibular Assessment   General  Observation  denies changes in vision or hearing, pt does have nausea and vomiting in the morning when she feels at her worst, denies sensitivity to light/sound/smells      Symptom Behavior   Type of Dizziness  Spinning    Frequency of Dizziness  intermittent    Duration of Dizziness  less than a minute    Aggravating Factors  Supine to sit;Rolling to right;Rolling to left;Sitting with head tilted back    Relieving Factors  Head stationary      Occulomotor Exam   Occulomotor Alignment  Normal    Spontaneous  Absent    Gaze-induced  Left beating nystagmus with L gaze    Smooth Pursuits  Intact    Saccades  Intact    Comment  convergence intact      Vestibulo-Occular Reflex   VOR to Slow Head Movement  Normal    VOR Cancellation  Normal    Comment  HIT: negative bilaterally      Positional Testing   Dix-Hallpike  Dix-Hallpike Right;Dix-Hallpike Left    Horizontal Canal Testing  Horizontal Canal Right;Horizontal Canal Left      Dix-Hallpike Right   Dix-Hallpike Right Duration  0    Dix-Hallpike Right Symptoms  No nystagmus      Dix-Hallpike Left   Dix-Hallpike Left Duration  3 seconds    Dix-Hallpike Left Symptoms  Upbeat, left rotatory nystagmus      Horizontal Canal Right   Horizontal Canal Right Duration  0    Horizontal Canal Right Symptoms  Normal      Horizontal Canal Left   Horizontal Canal Left Duration  0    Horizontal Canal Left Symptoms  Normal          Objective measurements completed on examination: See above findings.       Vestibular Treatment/Exercise - 06/05/18 1221      Vestibular Treatment/Exercise   Vestibular Treatment Provided  Canalith Repositioning    Canalith Repositioning  Epley Manuever Left       EPLEY MANUEVER LEFT   Number of Reps   1    Overall Response   Symptoms Resolved            PT Education - 06/05/18 1243  Education Details  clinical findings, BPPV, PT POC and goals    Person(s) Educated  Patient    Methods   Explanation    Comprehension  Verbalized understanding          PT Long Term Goals - 06/05/18 1253      PT LONG TERM GOAL #1   Title  Pt will demonstrate negative positional testing (negative for subjective vertigo and negative nystagmus)    Time  4    Period  Weeks    Status  New    Target Date  07/05/18      PT LONG TERM GOAL #2   Title  Pt will report no dizziness with rolling, supine <> sit and tilting head back     Time  4    Period  Weeks    Status  New    Target Date  07/05/18      PT LONG TERM GOAL #3   Title  Pt will be independent with HEP return demonstrating safe performance of home epley and habituation if indicated    Time  4    Period  Weeks    Status  New    Target Date  07/05/18             Plan - 06/05/18 1245    Clinical Impression Statement  Pt is a 67 year old female referred to Neuro OPPT for evaluation of vertigo.  Pt's PMH is significant for the following: skin cancer, R lower lobe lung cancer with lobectomy, RBBB, HTN, seizures, emphysema, chronic headaches, OA, cervical spine facet joint OA, DDD, senile nuclear sclerosis, vitreous degeneration, afib and alcoholism in remission. The following deficits were noted during pt's exam: subjective vertigo and upbeating, L rotary nystagmus with L hallpike-dix testing <30 seconds indicating L posterior canal canalithiasis, treated x 2 with CRM and resolution of symptoms.  Pt would benefit from skilled PT to address these impairments and functional limitations to maximize functional mobility independence and reduce falls risk.    History and Personal Factors relevant to plan of care:  lives alone, skin cancer, R lower lobe lung cancer with lobectomy, RBBB, HTN, seizures, emphysema, chronic headaches, OA, cervical spine facet joint OA, DDD, senile nuclear sclerosis, vitreous degeneration, afib and alcoholism in remission    Clinical Presentation  Stable    Clinical Presentation due to:  lives alone, skin  cancer, R lower lobe lung cancer with lobectomy, RBBB, HTN, seizures, emphysema, chronic headaches, OA, cervical spine facet joint OA, DDD, senile nuclear sclerosis, vitreous degeneration, afib and alcoholism in remission    Clinical Decision Making  Low    Rehab Potential  Good    PT Frequency  1x / week    PT Duration  4 weeks    PT Treatment/Interventions  ADLs/Self Care Home Management;Canalith Repostioning;Vestibular;Therapeutic activities;Therapeutic exercise;Balance training;Neuromuscular re-education;Patient/family education    PT Next Visit Plan  reassess L BPPV and treat if indicated.  teach home maneuver with pillow.  teach rolling habituation if needed.    Consulted and Agree with Plan of Care  Patient       Patient will benefit from skilled therapeutic intervention in order to improve the following deficits and impairments:  Dizziness, Decreased balance  Visit Diagnosis: BPPV (benign paroxysmal positional vertigo), left  Dizziness and giddiness  Unsteadiness on feet     Problem List Patient Active Problem List   Diagnosis Date Noted  . Benign paroxysmal positional vertigo of right ear 05/25/2018  . Hypertension  12/29/2017  . History of atrial fibrillation 12/29/2017  . History of skin cancer 12/29/2017  . Alcoholism in remission ([place]) 12/29/2017  . Chronic headaches   . Emphysema of lung ([place])   . Left thyroid nodule 10/07/2017  . S/P lobectomy of lung 09/16/2017  . Lens replaced by other means 08/12/2017  . Tear film insufficiency 08/12/2017  . Vitreous degeneration 08/12/2017  . Cervicogenic headache 08/11/2017  . DDD (degenerative disc disease), cervical 08/11/2017  . Lung cancer, Right lower lobe ([place]) 08/06/2017  . Arthritis of facet joint of cervical spine 07/28/2017  . Palpitations 03/25/2017  . RBBB 07/11/2015  . Senile nuclear sclerosis 12/25/2011  . Myopia 12/10/2011    Rico Junker, PT, DPT 06/05/18    12:57 PM    [facility] [address] [place], [place], [address] Phone: [phone]   Fax:  [phone]  Name: Margaret Charles MRN: [id number] Date of Birth: [birth year]

## 2018-06-10 ENCOUNTER — Ambulatory Visit: Payer: Medicare Other | Admitting: Cardiovascular Disease

## 2018-06-10 ENCOUNTER — Encounter: Payer: Self-pay | Admitting: Cardiovascular Disease

## 2018-06-10 VITALS — BP 118/80 | HR 70 | Ht 67.0 in | Wt 145.1 lb

## 2018-06-10 DIAGNOSIS — I480 Paroxysmal atrial fibrillation: Secondary | ICD-10-CM

## 2018-06-10 DIAGNOSIS — I10 Essential (primary) hypertension: Secondary | ICD-10-CM | POA: Diagnosis not present

## 2018-06-10 NOTE — Patient Instructions (Signed)

## 2018-06-10 NOTE — Progress Notes (Signed)
Cardiology Office Note:    Date:  06/10/2018   ID:  Margaret Charles, DOB 26-Jul-1950, MRN [id number]  PCP:  [name], MD  Cardiologist:  Adrieanna Boteler   Referring MD: [name], MD   Problem list 1. Paroxysmal atrial fib - found after partial pneumonectomy 2.  Lung nodule 3.  Right bundle branch block   Chief Complaint  Patient presents with  . Hypertension  . Atrial Fibrillation       Margaret Charles is a 67 y.o. female with a hx of 1.4 cm lung nodule.  She has a history of right bundle branch block and palpitations.  We are asked to see her for preoperative clearance prior to lung surgery.  She has a RBBB - which she has known about for the past 10 years . She has seen Renaldo in [place]. ( [facility])  Echo at that time showed normal LV function  Was told that she was stable  Developed palpitations a year or so later.   30 day monitr did not show any issues  She had a low dose lung CT scan for screening ( since she was a smoker )  Was found to have a 1.4 cm mass.    deines any chest pain or dyspnea.   Walks 3 miles a day a a good pace .  Able to climb 3 flights of stairs without any problems  Does water aerobics.  BP has been elevated . She eats lots of salt - popcorn every day .  Hx of obesity in her 16s .  Has lost 90 lbs   December 08, 2017  Margaret Charles is seen back today after her Rt lower lobe lobectomy ( for stage 1 squamous cell carcinoma)  She apparently developed atrial fibrillation while in the hospital.  Her blood pressure was also not well controlled.  She was started on amiodarone 200 mg twice a day.  Which was gradually titrated down.  She is now on 200 mg a day and metoprolol   June 10, 2018: Here for follow up  Had PAF after a lobectomy    Past Medical History:  Diagnosis Date  . Arthritis    ddd  . Chronic headaches    imprved recently per patient   . Emphysema of lung ([place])    asymptomatic. noted on CT  . History of adenomatous  polyp of colon    2015 precancerous polyp history- due spring 2020.  5 years  . History of seizures    1980s few ??? seizure activity  (vagal response)  . Hypertension   . Palpitations   . RBBB (right bundle branch block)    palpitations  . Skin cancer    skin basal cell cancer    Past Surgical History:  Procedure Laterality Date  . BLADDER SURGERY  1985   Long term incontinence. [facility]- used small intestine to build bladder  . BREAST BIOPSY  2001  . CATARACT EXTRACTION W/ INTRAOCULAR LENS  IMPLANT, BILATERAL    . Intestinal blockage  1989 and 1990   bowel obstruction surgery  . Lung cancer removal   08/2017   Right lower lobe  . VIDEO ASSISTED THORACOSCOPY (VATS)/WEDGE RESECTION Right 09/16/2017   Procedure: VIDEO ASSISTED THORACOSCOPY (VATS)/LUNG RESECTION;  Surgeon: [name], MD;  Location: [facility];  Service: Thoracic;  Laterality: Right;  Marland Kitchen VIDEO BRONCHOSCOPY N/A 09/16/2017   Procedure: VIDEO BRONCHOSCOPY;  Surgeon: [name], MD;  Location: [facility];  Service: Thoracic;  Laterality:  N/A;  . WRIST SURGERY     de quervains tenosynovitis. Dr. Maureen Ralphs actually did this.     Current Medications: Current Meds  Medication Sig  . metoprolol tartrate (LOPRESSOR) 25 MG tablet Take 1 tablet (25 mg total) by mouth 2 (two) times daily.  . Multiple Vitamins-Minerals (CENTRUM ADULTS PO) Take 1 tablet by mouth daily.  Marland Kitchen Propylene Glycol-Glycerin (SOOTHE OP) Place 1 drop into both eyes 2 (two) times daily as needed (dry eyes).     Allergies:   Codeine   Social History   Socioeconomic History  . Marital status: Single    Spouse name: Not on file  . Number of children: Not on file  . Years of education: Not on file  . Highest education level: Not on file  Occupational History  . Occupation: Retired Tour manager  . Financial resource strain: Not on file  . Food insecurity:    Worry: Not on file    Inability: Not on file  . Transportation needs:    Medical:  Not on file    Non-medical: Not on file  Tobacco Use  . Smoking status: Former Smoker    Packs/day: 1.00    Years: 40.00    Pack years: 40.00    Types: Cigarettes    Last attempt to quit: 12/03/2008    Years since quitting: 9.5  . Smokeless tobacco: Never Used  Substance and Sexual Activity  . Alcohol use: Not Currently    Comment: hx  etoh  37 yrs  . Drug use: Never  . Sexual activity: Not Currently  Lifestyle  . Physical activity:    Days per week: Not on file    Minutes per session: Not on file  . Stress: Not on file  Relationships  . Social connections:    Talks on phone: Not on file    Gets together: Not on file    Attends religious service: Not on file    Active member of club or organization: Not on file    Attends meetings of clubs or organizations: Not on file    Relationship status: Not on file  Other Topics Concern  . Not on file  Social History Narrative   Single. Lives alone.    Lived in [place] for ome years to help with parents but otherwise in [place] most of life.       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  ROS:   Please see the history of present illness.     All other systems reviewed and are negative.  EKGs/Labs/Other Studies Reviewed:    The following studies were reviewed today:   EKG:      Recent Labs: 09/19/2017: TSH 4.392 09/20/2017: Magnesium 1.8 05/04/2018: ALT 17; BUN 11; Creatinine 0.78; Hemoglobin 12.4; Platelet Count 289; Potassium 4.6; Sodium 140  Recent Lipid Panel    Component Value Date/Time   CHOL 175  03/20/2018 0903   TRIG 103.0 03/20/2018 0903   HDL 57.90 03/20/2018 0903   CHOLHDL 3 03/20/2018 0903   VLDL 20.6 03/20/2018 0903   LDLCALC  96 03/20/2018 0903   Physical Exam: Blood pressure 118/80, Charles 70, height 5\' 7"  (1.702 m), weight 145 lb 1.9 oz (65.8 kg), SpO2 98 %.  GEN:  Well nourished, well developed in no acute distress HEENT: Normal NECK: No JVD; No carotid bruits LYMPHATICS: No lymphadenopathy CARDIAC: RRR , no murmurs, rubs, gallops RESPIRATORY:  Clear to auscultation without rales, wheezing or rhonchi  ABDOMEN: Soft, non-tender, non-distended MUSCULOSKELETAL:  No edema; No deformity  SKIN: Warm and dry NEUROLOGIC:  Alert and oriented x 3   ASSESSMENT:  Plan      1.  Paroxysmal atrial fibrillation:  .  Has not had any recurrent episodes of atrial fibrillation.  Is maintaining sinus rhythm.  2.  Essential hypertension:    Pressure seems well controlled.  Continue current medications   Medication Adjustments/Labs and Tests Ordered: Current medicines are reviewed at length with the patient today.  Concerns regarding medicines are outlined above.  No orders of the defined types were placed in this encounter.  No orders of the defined types were placed in this encounter.   Signed, Mertie Moores, MD  06/10/2018 6:43 PM    [place]

## 2018-06-18 ENCOUNTER — Ambulatory Visit: Payer: Medicare Other

## 2018-06-18 DIAGNOSIS — H8112 Benign paroxysmal vertigo, left ear: Secondary | ICD-10-CM

## 2018-06-18 DIAGNOSIS — R42 Dizziness and giddiness: Secondary | ICD-10-CM

## 2018-06-18 NOTE — Therapy (Signed)
[facility] [address] [name], [place], [address] Phone: [phone]   Fax:  [phone]  Physical Therapy Treatment  Patient Details  Name: Margaret Charles MRN: [id number] Date of Birth: 10/21/1950 Referring Provider (PT): [name], MD   Encounter Date: 06/18/2018  PT End of Session - 06/18/18 1219    Visit Number  2    Number of Visits  5    Date for PT Re-Evaluation  07/05/18    Authorization Type  UHC Medicare  $20 copay    PT Start Time  1102    PT Stop Time  1143    PT Time Calculation (min)  41 min    Equipment Utilized During Treatment  --   min guard to min A during supine to sit txf after positional testing/treatment   Activity Tolerance  Patient tolerated treatment well    Behavior During Therapy  [facility] for tasks assessed/performed       Past Medical History:  Diagnosis Date  . Arthritis    ddd  . Chronic headaches    imprved recently per patient   . Emphysema of lung ([place])    asymptomatic. noted on CT  . History of adenomatous polyp of colon    2015 precancerous polyp history- due spring 2020.  5 years  . History of seizures    1980s few ??? seizure activity  (vagal response)  . Hypertension   . Palpitations   . RBBB (right bundle branch block)    palpitations  . Skin cancer    skin basal cell cancer    Past Surgical History:  Procedure Laterality Date  . BLADDER SURGERY  1985   Long term incontinence. [facility]- used small intestine to build bladder  . BREAST BIOPSY  2001  . CATARACT EXTRACTION W/ INTRAOCULAR LENS  IMPLANT, BILATERAL    . Intestinal blockage  1989 and 1990   bowel obstruction surgery  . Lung cancer removal   08/2017   Right lower lobe  . VIDEO ASSISTED THORACOSCOPY (VATS)/WEDGE RESECTION Right 09/16/2017   Procedure: VIDEO ASSISTED THORACOSCOPY (VATS)/LUNG RESECTION;  Surgeon: [name], MD;  Location: [facility];  Service: Thoracic;  Laterality: Right;  Marland Kitchen  VIDEO BRONCHOSCOPY N/A 09/16/2017   Procedure: VIDEO BRONCHOSCOPY;  Surgeon: [name], MD;  Location: [facility];  Service: Thoracic;  Laterality: N/A;  . WRIST SURGERY     de quervains tenosynovitis. Dr. Maureen Ralphs actually did this.     There were no vitals filed for this visit.  Subjective Assessment - 06/18/18 1105    Subjective  Pt reported vertigo was better after the first few days but then it returned, 5/10 dizziness at worst (rolling L in bed and lying supine).     Pertinent History  facet joint impairment, cervicogenic headaches - receiving trigger point injections, migraines,     Patient Stated Goals  To not be dizzy    Currently in Pain?  Yes    Pain Score  --   5-6/10   Pain Location  Head    Pain Orientation  Posterior    Pain Descriptors / Indicators  Headache    Pain Type  Chronic pain    Pain Onset  More than a month ago    Pain Frequency  Intermittent    Aggravating Factors   certain movements, posture    Pain Relieving Factors  injections, heat             Vestibular Assessment -  06/18/18 1122      Positional Testing   Dix-Hallpike  Dix-Hallpike Left    Horizontal Canal Testing  Horizontal Canal Right;Horizontal Canal Left      Dix-Hallpike Left   Dix-Hallpike Left Duration  <5 seconds    Dix-Hallpike Left Symptoms  Upbeat, left rotatory nystagmus      Horizontal Canal Right   Horizontal Canal Right Duration  0    Horizontal Canal Right Symptoms  Normal      Horizontal Canal Left   Horizontal Canal Left Duration  0    Horizontal Canal Left Symptoms  Normal                Vestibular Treatment/Exercise - 06/18/18 1123      Vestibular Treatment/Exercise   Vestibular Treatment Provided  Canalith Repositioning    Canalith Repositioning  Epley Manuever Left       EPLEY MANUEVER LEFT   Number of Reps   2    Overall Response   Symptoms Resolved     RESPONSE DETAILS LEFT  Pt reported dizziness decr. from 6-7/10 to 3-4/10 after first  treatment. Pt reported intense dizziness upon sitting upright after second treatment. Pt required seated rest breaks after each treatment 2/2 dizziness/wooziness. Pt then reported s/s resolved during third L Dix-Hallpike assessment, after second treatment. However, upon sitting upright pt reported dizziness and experienced nausea/vomiting. PT then assessed for R and L horizontal canal BPPV, as pt noted to stop with head in flexion and then neutral during first L Dix-Hallpike. However, negative for s/s.            Self care: PT Education - 06/18/18 1218    Education Details  PT spent extensive time educating pt on BPPV, vestbular input for balance, and the importance of moving head/neck to maintain Cx spine ROM in order to improve vestibular input. PT discussed holding HEP until next session, based on pt's N/V after treatment today and pt lives alone/drives herself. Pt agreeable.     Person(s) Educated  Patient    Methods  Explanation    Comprehension  Verbalized understanding          PT Long Term Goals - 06/05/18 1253      PT LONG TERM GOAL #1   Title  Pt will demonstrate negative positional testing (negative for subjective vertigo and negative nystagmus)    Time  4    Period  Weeks    Status  New    Target Date  07/05/18      PT LONG TERM GOAL #2   Title  Pt will report no dizziness with rolling, supine <> sit and tilting head back     Time  4    Period  Weeks    Status  New    Target Date  07/05/18      PT LONG TERM GOAL #3   Title  Pt will be independent with HEP return demonstrating safe performance of home epley and habituation if indicated    Time  4    Period  Weeks    Status  New    Target Date  07/05/18            Plan - 06/18/18 1223    Clinical Impression Statement  Pt experienced concordant dizziness and brief L torsional upbeating nystagmus during L Dix-Hallpike test, consistent with L pBPPV canalithiasis. S/s resolved after two Epley treatments.  However, pt reported incr. dizziness upon sitting upright and experienced N/V and required  a 10 minute seated rest break prior to PT assessing for B horizontal canal BPPV. PT assessed for horizontal canal BPPV, as pt hesitated in supine with neck flexion/neutral spine during first L Dix-Hallpike testing. However, no s/s noted during all positional testing at end of session. Continue with POC.     Rehab Potential  Good    PT Frequency  1x / week    PT Duration  4 weeks    PT Treatment/Interventions  ADLs/Self Care Home Management;Canalith Repostioning;Vestibular;Therapeutic activities;Therapeutic exercise;Balance training;Neuromuscular re-education;Patient/family education    PT Next Visit Plan  reassess L BPPV and treat if indicated.  teach home maneuver with pillow (if pt feels better, as she lives alone and experienced N/V/dizziness after last session.  teach rolling habituation if needed.    Consulted and Agree with Plan of Care  Patient       Patient will benefit from skilled therapeutic intervention in order to improve the following deficits and impairments:  Dizziness, Decreased balance  Visit Diagnosis: BPPV (benign paroxysmal positional vertigo), left  Dizziness and giddiness     Problem List Patient Active Problem List   Diagnosis Date Noted  . Paroxysmal atrial fibrillation ([place]) 06/10/2018  . Benign paroxysmal positional vertigo of right ear 05/25/2018  . Hypertension 12/29/2017  . History of atrial fibrillation 12/29/2017  . History of skin cancer 12/29/2017  . Alcoholism in remission ([place]) 12/29/2017  . Chronic headaches   . Emphysema of lung ([place])   . Left thyroid nodule 10/07/2017  . S/P lobectomy of lung 09/16/2017  . Lens replaced by other means 08/12/2017  . Tear film insufficiency 08/12/2017  . Vitreous degeneration 08/12/2017  . Cervicogenic headache 08/11/2017  . DDD (degenerative disc disease), cervical 08/11/2017  . Lung cancer, Right lower lobe ([place])  08/06/2017  . Arthritis of facet joint of cervical spine 07/28/2017  . Palpitations 03/25/2017  . RBBB 07/11/2015  . Senile nuclear sclerosis 12/25/2011  . Myopia 12/10/2011    Tija Biss L 06/18/2018, 12:35 PM  [facility] [address] [place], [place], [address] Phone: [phone]   Fax:  [phone]  Name: Ruberta Holck MRN: [id number] Date of Birth: 11-05-1950  Geoffry Paradise, PT,DPT 06/18/18 12:36 PM Phone: [phone] Fax: [phone]

## 2018-06-25 ENCOUNTER — Ambulatory Visit: Payer: Medicare Other | Attending: Family Medicine

## 2018-06-25 DIAGNOSIS — H8112 Benign paroxysmal vertigo, left ear: Secondary | ICD-10-CM | POA: Diagnosis not present

## 2018-06-25 DIAGNOSIS — R42 Dizziness and giddiness: Secondary | ICD-10-CM | POA: Insufficient documentation

## 2018-06-25 NOTE — Therapy (Addendum)
[facility] [address] [place], [place], [address] Phone: [phone]   Fax:  [phone]  Physical Therapy Treatment  Patient Details  Name: Margaret Charles MRN: [id number] Date of Birth: Oct 11, 1950 Referring Provider (PT): [name], MD   Encounter Date: 06/25/2018  PT End of Session - 06/25/18 1522    Visit Number  3    Number of Visits  5    Date for PT Re-Evaluation  07/05/18    Authorization Type  UHC Medicare  $20 copay    PT Start Time  1446    PT Stop Time  1517   ended early as s/s resolved at this time   PT Time Calculation (min)  31 min    Activity Tolerance  Patient tolerated treatment well    Behavior During Therapy  [facility] for tasks assessed/performed       Past Medical History:  Diagnosis Date  . Arthritis    ddd  . Chronic headaches    imprved recently per patient   . Emphysema of lung ([place])    asymptomatic. noted on CT  . History of adenomatous polyp of colon    2015 precancerous polyp history- due spring 2020.  5 years  . History of seizures    1980s few ??? seizure activity  (vagal response)  . Hypertension   . Palpitations   . RBBB (right bundle branch block)    palpitations  . Skin cancer    skin basal cell cancer    Past Surgical History:  Procedure Laterality Date  . BLADDER SURGERY  1985   Long term incontinence. [facility]- used small intestine to build bladder  . BREAST BIOPSY  2001  . CATARACT EXTRACTION W/ INTRAOCULAR LENS  IMPLANT, BILATERAL    . Intestinal blockage  1989 and 1990   bowel obstruction surgery  . Lung cancer removal   08/2017   Right lower lobe  . VIDEO ASSISTED THORACOSCOPY (VATS)/WEDGE RESECTION Right 09/16/2017   Procedure: VIDEO ASSISTED THORACOSCOPY (VATS)/LUNG RESECTION;  Surgeon: [name], MD;  Location: [facility];  Service: Thoracic;  Laterality: Right;  Marland Kitchen VIDEO BRONCHOSCOPY N/A 09/16/2017   Procedure: VIDEO BRONCHOSCOPY;  Surgeon: [name], MD;  Location: [facility] OR;  Service: Thoracic;  Laterality: N/A;  . WRIST SURGERY     de quervains tenosynovitis. Dr. Maureen Ralphs actually did this.     There were no vitals filed for this visit.  Subjective Assessment - 06/25/18 1449    Subjective  Pt reported vertigo was resolved after last session (Friday-Monday), however, then pt felt very unsteady/dysequilibrium on Tuesday. Pt stated she was walking like she was drunk. She has had a cold with congestion but now feels better. Pt's neck is better today.     Pertinent History  facet joint impairment, cervicogenic headaches - receiving trigger point injections, migraines,     Patient Stated Goals  To not be dizzy    Currently in Pain?  No/denies             Vestibular Assessment - 06/25/18 1454      Positional Testing   Dix-Hallpike  Dix-Hallpike Left      Dix-Hallpike Left   Dix-Hallpike Left Duration  2-3 seconds of 6-7/10 dizziness during txf to supine/L Dix-Hallpike    Dix-Hallpike Left Symptoms  No nystagmus      No dizziness during second L Dix-Hallpike (after Epley treatment).           Vestibular Treatment/Exercise -  06/25/18 1501      Vestibular Treatment/Exercise   Vestibular Treatment Provided  Canalith Repositioning    Canalith Repositioning  Epley Manuever Left;Semont Procedure Left Posterior    Habituation Exercises  --       EPLEY MANUEVER LEFT   Number of Reps   1    Overall Response   Symptoms Resolved     RESPONSE DETAILS LEFT  No dizziness reported after first treatment and no nystagmus noted.       Semont Procedure Left Posterior   Number of Reps   1    Overall Response  No change    Response Details   PT taught this to pt for HEP prn.             PT Education - 06/25/18 1519    Education Details  PT educated pt on modified Semont to treat L pBPPV if s/s return. PT educated that nystagmus not noted during exam with only brief dizziness, which resolved after one L Epley maneuver.  PT discussed likley d/c next session if dizziness is improved.     Person(s) Educated  Patient    Methods  Explanation;Demonstration;Tactile cues;Verbal cues;Handout    Comprehension  Returned demonstration;Verbalized understanding          PT Long Term Goals - 06/05/18 1253      PT LONG TERM GOAL #1   Title  Pt will demonstrate negative positional testing (negative for subjective vertigo and negative nystagmus)    Time  4    Period  Weeks    Status  New    Target Date  07/05/18      PT LONG TERM GOAL #2   Title  Pt will report no dizziness with rolling, supine <> sit and tilting head back     Time  4    Period  Weeks    Status  New    Target Date  07/05/18      PT LONG TERM GOAL #3   Title  Pt will be independent with HEP return demonstrating safe performance of home epley and habituation if indicated    Time  4    Period  Weeks    Status  New    Target Date  07/05/18            Plan - 06/25/18 1523    Clinical Impression Statement  Pt demonstrated progress as all s/s resolved after one L Epley maneuver. Pt did report concordant dizziness during first L Dix-Hallpike but no nystagmus noted. All s/s resolved after treatment. PT educated pt on modified (slow) Semont maneuver to treat L pBPPV, and pt tolerated it well. Continue with POC.     Rehab Potential  Good    PT Frequency  1x / week    PT Duration  4 weeks    PT Treatment/Interventions  ADLs/Self Care Home Management;Canalith Repostioning;Vestibular;Therapeutic activities;Therapeutic exercise;Balance training;Neuromuscular re-education;Patient/family education    PT Next Visit Plan  Check goals and d/c as indicated. reassess L BPPV and treat if indicated.  teach home maneuver with pillow (if pt feels better, as she lives alone and experienced N/V/dizziness after last session.  teach rolling habituation if needed.    Consulted and Agree with Plan of Care  Patient       Patient will benefit from skilled  therapeutic intervention in order to improve the following deficits and impairments:  Dizziness, Decreased balance  Visit Diagnosis: BPPV (benign paroxysmal positional vertigo), left  Dizziness and  giddiness     Problem List Patient Active Problem List   Diagnosis Date Noted  . Paroxysmal atrial fibrillation ([place]) 06/10/2018  . Benign paroxysmal positional vertigo of right ear 05/25/2018  . Hypertension 12/29/2017  . History of atrial fibrillation 12/29/2017  . History of skin cancer 12/29/2017  . Alcoholism in remission ([place]) 12/29/2017  . Chronic headaches   . Emphysema of lung ([place])   . Left thyroid nodule 10/07/2017  . S/P lobectomy of lung 09/16/2017  . Lens replaced by other means 08/12/2017  . Tear film insufficiency 08/12/2017  . Vitreous degeneration 08/12/2017  . Cervicogenic headache 08/11/2017  . DDD (degenerative disc disease), cervical 08/11/2017  . Lung cancer, Right lower lobe ([place]) 08/06/2017  . Arthritis of facet joint of cervical spine 07/28/2017  . Palpitations 03/25/2017  . RBBB 07/11/2015  . Senile nuclear sclerosis 12/25/2011  . Myopia 12/10/2011    Cyril Railey L 06/25/2018, 3:26 PM  [facility] [address] [place], [place], [address] Phone: [phone]   Fax:  [phone]  Name: Margaret Charles MRN: [id number] Date of Birth: [birth year]  Geoffry Paradise, PT,DPT 06/25/18 3:26 PM Phone: [phone] Fax: [phone]  PHYSICAL THERAPY DISCHARGE SUMMARY  Visits from Start of Care: 3  Plan: Patient agrees to discharge.  Patient goals were met. Patient is being discharged due to being pleased with the current functional level.  ?????      Pt last seen 06/25/18, did not return to PT.    Lyndee Hensen, PT, DPT 11:24 AM  12/07/18

## 2018-06-25 NOTE — Patient Instructions (Signed)
   Semont treatment for Left sided BPPV:  Begin sitting upright, turn head to the Right side and then lie down on left side (you will end up looking up at ceiling), hold until dizziness subsides plus 30 seconds. Slowly sit up, and then lie down on Right side, with head turned to the Right side, so forehead ends up on mat and chin tucked. Hold until dizziness subsides plus 30 seconds. Perform as needed, when dizziness (spinning occurs), 2-3 reps.

## 2018-07-02 ENCOUNTER — Ambulatory Visit: Payer: Medicare Other

## 2018-07-30 ENCOUNTER — Encounter: Payer: Medicare Other | Admitting: Cardiothoracic Surgery

## 2018-07-31 ENCOUNTER — Telehealth: Payer: Self-pay

## 2018-07-31 NOTE — Telephone Encounter (Signed)
Per 2/7 phone msg return calls. Was unable to speak with the patient concerning the rescheduling of her appointment. Left msg for patient to return call again with more information on when she would like to r/s

## 2018-07-31 NOTE — Telephone Encounter (Signed)
Patient returned call concerning her upcoming appointment to be r/s for a later date due to she will be out of town. Per 2/7 phone msg return calls. Transferred patient to CT department to get scheduled also

## 2018-08-10 LAB — HM MAMMOGRAPHY

## 2018-08-13 ENCOUNTER — Ambulatory Visit: Payer: Medicare Other | Admitting: Family Medicine

## 2018-08-13 ENCOUNTER — Encounter: Payer: Self-pay | Admitting: Family Medicine

## 2018-08-13 VITALS — BP 110/70 | HR 80 | Temp 98.2°F | Ht 67.0 in | Wt 146.8 lb

## 2018-08-13 DIAGNOSIS — I10 Essential (primary) hypertension: Secondary | ICD-10-CM

## 2018-08-13 DIAGNOSIS — B9689 Other specified bacterial agents as the cause of diseases classified elsewhere: Secondary | ICD-10-CM

## 2018-08-13 DIAGNOSIS — J329 Chronic sinusitis, unspecified: Secondary | ICD-10-CM | POA: Diagnosis not present

## 2018-08-13 MED ORDER — AMOXICILLIN-POT CLAVULANATE 875-125 MG PO TABS: 1.0000 | ORAL_TABLET | Freq: Two times a day (BID) | ORAL | 0 refills | Status: AC

## 2018-08-13 NOTE — Progress Notes (Signed)
PCP: [name], MD  Subjective:  Margaret Charles is a 68 y.o. year old very pleasant female patient who presents with sinusitis symptoms including nasal congestion, postnasal drip  Symptoms started 06/17/18 Started with a cold lasting peraps 10 days Then had another cold mid January Was just finally starting to feel better last week  Then last Friday had worsening of symptoms.  Notes a lot of sinus discharge Ears feel full Some cough. Only upper chest congestion.  A lot of post nasal drip.  Yellow discharge typically- also some sputum production.  No fever, chills, shortness of breath.  Some right rib discomfort with coughing Has been trying mucinex. Also took sudafed this morning Fortunately BP looks great despite sudafed (Discussed risks)  -sick contacts/travel/risks: denies flu exposure. 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 History of atrial fibrillation 12/29/2017    Priority: High  . Alcoholism in remission ([place]) 12/29/2017    Priority: High  . Emphysema of lung ([place])     Priority: High  . S/P lobectomy of lung 09/16/2017    Priority: High  . Lung cancer, Right lower lobe ([place]) 08/06/2017    Priority: High  . Hypertension 12/29/2017    Priority: Medium  . History of skin cancer 12/29/2017    Priority: Medium  . Chronic headaches     Priority: Medium  . Left thyroid nodule 10/07/2017    Priority: Medium  . Palpitations 03/25/2017    Priority: Medium  . Lens replaced by other means 08/12/2017    Priority: Low  . Tear film insufficiency 08/12/2017    Priority: Low  . Vitreous degeneration 08/12/2017    Priority: Low  . Cervicogenic headache 08/11/2017    Priority: Low  . DDD (degenerative disc disease), cervical 08/11/2017    Priority: Low  . Arthritis of facet joint of cervical spine 07/28/2017   Priority: Low  . RBBB 07/11/2015    Priority: Low  . Senile nuclear sclerosis 12/25/2011    Priority: Low  . Myopia 12/10/2011    Priority: Low  . Paroxysmal atrial fibrillation ([place]) 06/10/2018  . Benign paroxysmal positional vertigo of right ear 05/25/2018    Medications- reviewed  Current Outpatient Medications  Medication Sig Dispense Refill  . guaiFENesin (MUCINEX) 600 MG 12 hr tablet Take by mouth 2 (two) times daily.    . metoprolol tartrate (LOPRESSOR) 25 MG tablet Take 1 tablet (25 mg total) by mouth 2 (two) times daily. 180 tablet 3  . Multiple Vitamins-Minerals (CENTRUM ADULTS PO) Take 1 tablet by mouth daily.    Marland Kitchen Propylene Glycol-Glycerin (SOOTHE OP) Place 1 drop into both eyes 2 (two) times daily as needed (dry eyes).     No current facility-administered medications for this visit.     Objective: BP 110/70 (BP Location: Right Arm, Patient Position: Sitting, Cuff Size: Normal)   Pulse 80   Temp 98.2 F (36.8 C) (Oral)   Ht 5\' 7"  (1.702 m)   Wt 146 lb 12.8 oz (66.6 kg)   SpO2 98%   BMI 22.99 kg/m  Gen: NAD, resting comfortably HEENT: Turbinates erythematous with yellow drainage, TM normal, pharynx mildly erythematous with no tonsilar exudate or edema, minimal sinus tenderness CV: RRR no murmurs rubs or gallops Lungs: CTAB no crackles, wheeze, rhonchi Ext: no edema Skin: warm, dry, no rash Neuro: Gait and speech  normal  Assessment/Plan:  Sinus infection/Sinusitis Hypertension  Bacterial based on: Symptoms >10 days, double sickening Treatment:  -symptomatic care with  Plain mucinex or mucinex- DM (if you want to have something to help with cough as well) Sinus rinses like a Neti Pot or Neilmed sinus rinse (make sure to follow instructions for water preparation) or nasal saline -Likely blood pressure is well controlled today-I encouraged patient to discontinue Sudafed due to potential risks.  Continue metoprolol  -Antibiotic indicated: yes Meds ordered  this encounter  Medications  . amoxicillin-clavulanate (AUGMENTIN) 875-125 MG tablet    Sig: Take 1 tablet by mouth 2 (two) times daily for 7 days. Take with food.     Dispense:  14 tablet    Refill:  0   Finally, we reviewed reasons to return to care including if symptoms worsen or persist  (despite above treatments) or new concerns arise (particularly fever or shortness of breath)   Garret Reddish, MD

## 2018-08-13 NOTE — Patient Instructions (Addendum)
Sinus infection/Sinusitis  Bacterial based on: Symptoms >10 days, double sickening Treatment:  -symptomatic care with  Plain mucinex or mucinex- DM (if you want to have something to help with cough as well) Sinus rinses like a Neti Pot or Neilmed sinus rinse (make sure to follow instructions for water preparation) or nasal saline Try to avoid sudafed as can raise blood pressure   -Antibiotic indicated: yes Meds ordered this encounter  Medications  . amoxicillin-clavulanate (AUGMENTIN) 875-125 MG tablet    Sig: Take 1 tablet by mouth 2 (two) times daily for 7 days. Take with food.     Dispense:  14 tablet    Refill:  0   Finally, we reviewed reasons to return to care including if symptoms worsen or persist  (despite above treatments) or new concerns arise (particularly fever or shortness of breath)

## 2018-08-14 ENCOUNTER — Ambulatory Visit: Payer: Medicare Other | Admitting: Family Medicine

## 2018-08-21 ENCOUNTER — Encounter: Payer: Self-pay | Admitting: Family Medicine

## 2018-08-24 ENCOUNTER — Ambulatory Visit: Payer: Medicare Other | Admitting: Endocrinology

## 2018-08-27 ENCOUNTER — Ambulatory Visit: Payer: Medicare Other | Admitting: Cardiothoracic Surgery

## 2018-08-27 ENCOUNTER — Telehealth: Payer: Self-pay | Admitting: Nurse Practitioner

## 2018-08-27 ENCOUNTER — Other Ambulatory Visit: Payer: Self-pay

## 2018-08-27 ENCOUNTER — Encounter: Payer: Self-pay | Admitting: Cardiothoracic Surgery

## 2018-08-27 VITALS — BP 118/91 | HR 83 | Resp 16 | Ht 67.0 in | Wt 144.0 lb

## 2018-08-27 DIAGNOSIS — I119 Hypertensive heart disease without heart failure: Secondary | ICD-10-CM

## 2018-08-27 DIAGNOSIS — I712 Thoracic aortic aneurysm, without rupture: Secondary | ICD-10-CM

## 2018-08-27 DIAGNOSIS — Z902 Acquired absence of lung [part of]: Secondary | ICD-10-CM | POA: Diagnosis not present

## 2018-08-27 DIAGNOSIS — I77810 Thoracic aortic ectasia: Secondary | ICD-10-CM

## 2018-08-27 DIAGNOSIS — I7121 Aneurysm of the ascending aorta, without rupture: Secondary | ICD-10-CM

## 2018-08-27 DIAGNOSIS — C3431 Malignant neoplasm of lower lobe, right bronchus or lung: Secondary | ICD-10-CM

## 2018-08-27 NOTE — Telephone Encounter (Signed)
-----   Message from Thayer Headings, MD sent at 08/27/2018  4:31 PM EST ----- Please order an echocardiogram  Pt has PAF and a dilated asc. Aorta    ----- Message ----- From: [name], MD Sent: 08/27/2018  11:21 AM EST To: Thayer Headings, MD, Curt Bears, MD, #  Please see this recent information about our joint patient.  Phil, this patient has never had echo, ? Need no murmur but mildly dilated 4.2 cm and family history of dilated aorta    Edward B.  Gerhardt MD [facility] [address]  [place] [address]        [phone] office        [phone] beeper

## 2018-08-27 NOTE — Patient Instructions (Signed)

## 2018-08-27 NOTE — Progress Notes (Signed)
[address]       [place] [address]             [phone]                  Baneza Henneke [place] Medical Record #[id number] Date of Birth: [birth year]  Referring IR:JJOACZY, Ronie Spies, MD Primary Cardiology: Primary Care:Hunter, Brayton Mars, MD  Chief Complaint:  Follow Up Visit 09/16/2017  OPERATIVE REPORT PREOPERATIVE DIAGNOSIS:  Right lower lobe lung mass. POSTOPERATIVE DIAGNOSES: 1. Right lower lobe lung mass. 2. Non-small cell carcinoma of the lung. PROCEDURES PERFORMED: 1. Bronchoscopy. 2. Right video-assisted thoracoscopy. 3. Wedge resection, right lower lobe. 4. Completion right lower lobectomy with lymph node dissection. 5. Placement of On-Q. SURGEON:  Lanelle Bal, MD   Cancer Staging Lung cancer, Right lower lobe [facility]) Staging form: Lung, AJCC 8th Edition - Clinical: No stage assigned - Unsigned - Pathologic stage from 09/19/2017: Stage IA2 (pT1b, pN0, cM0) - Signed by [name], MD on 09/19/2017   History of Present Illness:      Patient Margaret Charles after her resection of stage Margaret a right lower lobe non-small cell lung cancer now approximately 1 year postop.  She still has some vague awareness of her right chest, but no definite pain.  She continues to do water aerobics.  Denies shortness of breath.  On her scans she does have a dilated ascending aorta, with no associated murmur of aortic insufficiency.  He tells me today that her sister who lives out of the country is also being followed for a dilated ascending aorta, though the patient has no specific details about this. There is no family history of aortic dissection or ascending aortic aneurysm repair  Zubrod Score: At the time of surgery this patient's most appropriate activity status/level should be described as: [x]     0    Normal activity, no symptoms []     1    Restricted in physical strenuous activity but ambulatory, able to do out light work []     2     Ambulatory and capable of self care, unable to do work activities, up and about                 >50 % of waking hours                                                                                   []     3    Only limited self care, in bed greater than 50% of waking hours []     4    Completely disabled, no self care, confined to bed or chair []     5    Moribund  Social History   Tobacco Use  Smoking Status Former Smoker  . Packs/day: 1.00  . Years: 40.00  . Pack years: 40.00  . Types: Cigarettes  . Last attempt to quit: 12/03/2008  . Years since quitting: 9.7  Smokeless Tobacco Never Used       Allergies  Allergen Reactions  . Codeine Nausea And Vomiting    Current Outpatient Medications  Medication Sig Dispense Refill  .  guaiFENesin (MUCINEX) 600 MG 12 hr tablet Take by mouth 2 (two) times daily.    . metoprolol tartrate (LOPRESSOR) 25 MG tablet Take 1 tablet (25 mg total) by mouth 2 (two) times daily. 180 tablet 3  . Multiple Vitamins-Minerals (CENTRUM ADULTS PO) Take 1 tablet by mouth daily.    Marland Kitchen Propylene Glycol-Glycerin (SOOTHE OP) Place 1 drop into both eyes 2 (two) times daily as needed (dry eyes).     No current facility-administered medications for this visit.        Physical Exam: BP (!) 118/91 (BP Location: Right Arm, Patient Position: Sitting, Cuff Size: Normal)   Pulse 83   Resp 16   Ht 5\' 7"  (1.702 m)   Wt 144 lb (65.3 kg)   SpO2 100% Comment: RA  BMI 22.55 kg/m  General appearance: alert and cooperative Head: Normocephalic, without obvious abnormality, atraumatic Neck: no adenopathy, no carotid bruit, no JVD, supple, symmetrical, trachea midline and thyroid not enlarged, symmetric, no tenderness/mass/nodules Lymph nodes: Cervical, supraclavicular, and axillary nodes normal. Resp: clear to auscultation bilaterally Back: symmetric, no curvature. ROM normal. No CVA tenderness. Cardio: regular rate and rhythm, S1, S2 normal, no murmur, click, rub or  gallop GI: soft, non-tender; bowel sounds normal; no masses,  no organomegaly Extremities: extremities normal, atraumatic, no cyanosis or edema and Homans sign is negative, no sign of DVT Neurologic: Grossly normal   Diagnostic Studies & Laboratory data:         Recent Radiology Findings: CLINICAL DATA:  RIGHT lung cancer diagnosed February 2019. RIGHT lower lobectomy.  EXAM: CT CHEST WITH CONTRAST  TECHNIQUE: Multidetector CT imaging of the chest was performed during intravenous contrast administration.  CONTRAST:  8mL OMNIPAQUE IOHEXOL 300 MG/ML  SOLN  COMPARISON:  PET scan 08/25/2017  FINDINGS: Cardiovascular: 4 cm ascending aorta. No significant vascular findings. Normal heart size. No pericardial effusion.  Mediastinum/Nodes: No axillary or supraclavicular adenopathy. No mediastinal adenopathy. Esophagus normal  Lungs/Pleura: RIGHT lower lobectomy. Small moderate RIGHT effusion. No nodularity in the RIGHT lung. Several benign-appearing nodules are present along the superior aspect of the RIGHT oblique fissure.  The LEFT lung is clear.  Upper Abdomen: Limited view of the liver, kidneys, pancreas are unremarkable. Normal adrenal glands.  Musculoskeletal: No aggressive osseous lesion  IMPRESSION: 1. No evidence of RIGHT lung cancer recurrence following RIGHT lower lobectomy. 2. RIGHT lower lobe effusion is new from prior. 3. No evidence of metastatic adenopathy.   Electronically Signed   By: Suzy Bouchard M.D.   On: 05/04/2018 17:17  Patient does have a dilated ascending aorta 4.2 cm, appears stable when compared to her previous PET scan.  Margaret have independently reviewed the above radiology findings and reviewed findings  with the patient.  Recent Labs: Lab Results  Component Value Date   WBC 10.0 05/04/2018   HGB 12.4 05/04/2018   HCT 36.8 05/04/2018   PLT 289 05/04/2018   GLUCOSE 89 05/04/2018   CHOL 175 03/20/2018   TRIG 103.0  03/20/2018   HDL 57.90 03/20/2018   LDLCALC 96 03/20/2018   ALT 17 05/04/2018   AST 18 05/04/2018   NA 140 05/04/2018   K 4.6 05/04/2018   CL 108 05/04/2018   CREATININE 0.78 05/04/2018   BUN 11 05/04/2018   CO2 25 05/04/2018   TSH 4.392 09/19/2017   INR 0.97 09/11/2017   Aortic Size Index=    4.2     /Body surface area is 1.76 meters squared. = 2.38  < 2.75 cm/m2  4% risk per year 2.75 to 4.25          8% risk per year > 4.25 cm/m2    20% risk per year     Assessment / Plan:   #1 status post right lower lobectomy for stage Margaret non-small cell carcinoma of the lung, now 1 year postop without evidence of recurrence #2 incidentally noted ascending aorta 4.2 cm, no murmur associated with aortic insufficiency, patient has not had an echocardiogram-she is followed in cardiology for transient atrial fibrillation perioperatively.  The diagnosis of dilatation of the a sending aorta is discussed with the patient, she was given printed material about this, and cautioned about strenuous lifting, use of quinolones, and need of good blood pressure control.  Patient was warned about not using Cipro and similar antibiotics. Recent studies have raised concern that fluoroquinolone antibiotics could be associated with an increased risk of aortic aneurysm Fluoroquinolones have non-antimicrobial properties that might jeopardise the integrity of the extracellular matrix of the vascular wall In a  propensity score matched cohort study in [place], there was a 66% increased rate of aortic aneurysm or dissection associated with oral fluoroquinolone use, compared with amoxicillin use, within a 60 day risk period from start of treatment  Currently well following the ascending aorta with serial CT scan of the chest that are being done to follow her lung resection.  Oncology has already arranged for CT scan of chest in May of this year    [name] 08/27/2018 10:53 AM

## 2018-08-27 NOTE — Telephone Encounter (Signed)
Echo order placed and message sent to scheduling to contact patient with echo appointment

## 2018-08-28 ENCOUNTER — Telehealth: Payer: Self-pay | Admitting: Cardiovascular Disease

## 2018-08-28 NOTE — Telephone Encounter (Signed)
Spoke with patient about scheduling echo for Wed. 3/11. I answered questions to her satisfaction and she states she had an echo 3 or so years ago at [facility]. She is agreeable with plan and thanked me for my help.

## 2018-08-28 NOTE — Telephone Encounter (Signed)
Follow up:    Patient returning call back. Please call patient.

## 2018-08-31 ENCOUNTER — Ambulatory Visit: Payer: Medicare Other | Admitting: Endocrinology

## 2018-08-31 ENCOUNTER — Other Ambulatory Visit: Payer: Self-pay

## 2018-08-31 ENCOUNTER — Encounter: Payer: Self-pay | Admitting: Endocrinology

## 2018-08-31 VITALS — BP 124/70 | HR 96 | Ht 67.0 in | Wt 146.4 lb

## 2018-08-31 DIAGNOSIS — E041 Nontoxic single thyroid nodule: Secondary | ICD-10-CM | POA: Diagnosis not present

## 2018-08-31 LAB — T4, FREE: Free T4: 0.66 ng/dL (ref 0.60–1.60)

## 2018-08-31 LAB — TSH: TSH: 5.17 u[IU]/mL — ABNORMAL HIGH (ref 0.35–4.50)

## 2018-08-31 MED ORDER — LEVOTHYROXINE SODIUM 25 MCG PO TABS: 25.0000 ug | ORAL_TABLET | Freq: Every day | ORAL | 3 refills | Status: DC

## 2018-08-31 NOTE — Patient Instructions (Addendum)
Let's recheck the ultrasound.  you will receive a phone call, about a day and time for an appointment. Blood tests are requested for you today.  We'll let you know about the results.  If these results are good, please come back for a follow-up appointment in 1 year.

## 2018-08-31 NOTE — Progress Notes (Signed)
Subjective:    Patient ID: Margaret Charles, female    DOB: [birth year], 68 y.o.   MRN: [id number]  HPI Pt returns for f/u of MNG (dx'ed early 2019, on a PET CT; no nodule met criteria for bx or f/u, based on [place] criteria; she also has h/o stage 1 SCCA of the lung; TSH is slightly high; she does not notice the goiter.   Past Medical History:  Diagnosis Date  . Arthritis    ddd  . Chronic headaches    imprved recently per patient   . Emphysema of lung ([place])    asymptomatic. noted on CT  . History of adenomatous polyp of colon    2015 precancerous polyp history- due spring 2020.  5 years  . History of seizures    1980s few ??? seizure activity  (vagal response)  . Hypertension   . Palpitations   . RBBB (right bundle branch block)    palpitations  . Skin cancer    skin basal cell cancer    Past Surgical History:  Procedure Laterality Date  . BLADDER SURGERY  1985   Long term incontinence. [facility]- used small intestine to build bladder  . BREAST BIOPSY  2001  . CATARACT EXTRACTION W/ INTRAOCULAR LENS  IMPLANT, BILATERAL    . Intestinal blockage  1989 and 1990   bowel obstruction surgery  . Lung cancer removal   08/2017   Right lower lobe  . VIDEO ASSISTED THORACOSCOPY (VATS)/WEDGE RESECTION Right 09/16/2017   Procedure: VIDEO ASSISTED THORACOSCOPY (VATS)/LUNG RESECTION;  Surgeon: [name], MD;  Location: [facility];  Service: Thoracic;  Laterality: Right;  Marland Kitchen VIDEO BRONCHOSCOPY N/A 09/16/2017   Procedure: VIDEO BRONCHOSCOPY;  Surgeon: [name], MD;  Location[facility];  Service: Thoracic;  Laterality: N/A;  . WRIST SURGERY     de quervains tenosynovitis. Dr. Maureen Ralphs actually did this.     Social History   Socioeconomic History  . Marital status: Single    Spouse name: Not on file  . Number of children: Not on file  . Years of education: Not on file  . Highest education level: Not on file  Occupational History  . Occupation: Retired Tour manager  .  Financial resource strain: Not on file  . Food insecurity:    Worry: Not on file    Inability: Not on file  . Transportation needs:    Medical: Not on file    Non-medical: Not on file  Tobacco Use  . Smoking status: Former Smoker    Packs/day: 1.00    Years: 40.00    Pack years: 40.00    Types: Cigarettes    Last attempt to quit: 12/03/2008    Years since quitting: 9.7  . Smokeless tobacco: Never Used  Substance and Sexual Activity  . Alcohol use: Not Currently    Comment: hx  etoh  38 yrs  . Drug use: Never  . Sexual activity: Not Currently  Lifestyle  . Physical activity:    Days per week: Not on file    Minutes per session: Not on file  . Stress: Not on file  Relationships  . Social connections:    Talks on phone: Not on file    Gets together: Not on file    Attends religious service: Not on file    Active member of club or organization: Not on file    Attends meetings of clubs or organizations: Not on file    Relationship status:  Not on file  . Intimate partner violence:    Fear of current or ex partner: Not on file    Emotionally abused: Not on file    Physically abused: Not on file    Forced sexual activity: Not on file  Other Topics Concern  . Not on file  Social History Narrative   Single. Lives alone.    Lived in [place] for ome years to help with parents but otherwise in [place] most of life.       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 guaiFENesin (MUCINEX) 600 MG 12 hr tablet Take by mouth 2 (two) times daily.    . metoprolol tartrate (LOPRESSOR) 25 MG tablet Take 1 tablet (25 mg total) by mouth 2 (two) times daily. 180 tablet 3  . Multiple Vitamins-Minerals (CENTRUM ADULTS PO) Take 1 tablet by mouth  daily.    Marland Kitchen Propylene Glycol-Glycerin (SOOTHE OP) Place 1 drop into both eyes 2 (two) times daily as needed (dry eyes).     No current facility-administered medications on file prior to visit.     Allergies  Allergen Reactions  . Codeine Nausea And Vomiting  . Quinolones     Patient was warned about not using Cipro and similar antibiotics. Recent studies have raised concern that fluoroquinolone antibiotics could be associated with an increased risk of aortic aneurysm Fluoroquinolones have non-antimicrobial properties that might jeopardise the integrity of the extracellular matrix of the vascular wall In a  propensity score matched cohort study in [place], there was a 66% increased rate of aortic aneurysm or dissection associated with oral fluoroquinolone use, compared wit    Family History  Problem Relation Age of Onset  . Dementia Mother   . Heart attack Mother        age 66. after hip fracture.   . Hypertension Mother   . Prostate cancer Father   . Hypertension Father   . Breast cancer Sister   . Hypertension Sister   . Heart attack Sister        75. former smoker  . Heart attack Paternal Grandfather   . Hypertension Sister     BP 124/70 (BP Location: Right Arm, Patient Position: Sitting, Cuff Size: Normal)   Pulse 96   Ht '5\' 7"'$  (1.702 m)   Wt 146 lb 6.4 oz (66.4 kg)   SpO2 94%   BMI 22.93 kg/m   Review of Systems Denies neck pain    Objective:   Physical Exam VITAL SIGNS:  See vs page GENERAL: no distress NECK: I cannot appreciate the LLP thyroid nodule.        Assessment & Plan:  MNG: although [place] criteria don't favor f/u, cancer and borderline elev TSH favor recheck of [place].    Patient Instructions  Let's recheck the ultrasound.  you will receive a phone call, about a day and time for an appointment. Blood tests are requested for you today.  We'll let you know about the results.  If these results are good, please come back for a follow-up appointment in 1  year.

## 2018-09-02 ENCOUNTER — Ambulatory Visit (HOSPITAL_COMMUNITY): Payer: Medicare Other | Attending: Cardiovascular Disease

## 2018-09-02 ENCOUNTER — Other Ambulatory Visit: Payer: Self-pay

## 2018-09-02 DIAGNOSIS — I77810 Thoracic aortic ectasia: Secondary | ICD-10-CM | POA: Diagnosis not present

## 2018-09-02 DIAGNOSIS — I119 Hypertensive heart disease without heart failure: Secondary | ICD-10-CM | POA: Insufficient documentation

## 2018-09-13 ENCOUNTER — Other Ambulatory Visit: Payer: Self-pay | Admitting: Physician Assistant

## 2018-09-24 ENCOUNTER — Ambulatory Visit: Payer: Medicare Other

## 2018-10-22 IMAGING — CR DG CHEST 2V
2 series · 2 of 2 positions shown · non-contrast
Comparison: None.

CLINICAL DATA: Preoperative radiograph

EXAM:
CHEST - 2 VIEW

[w chest pa]
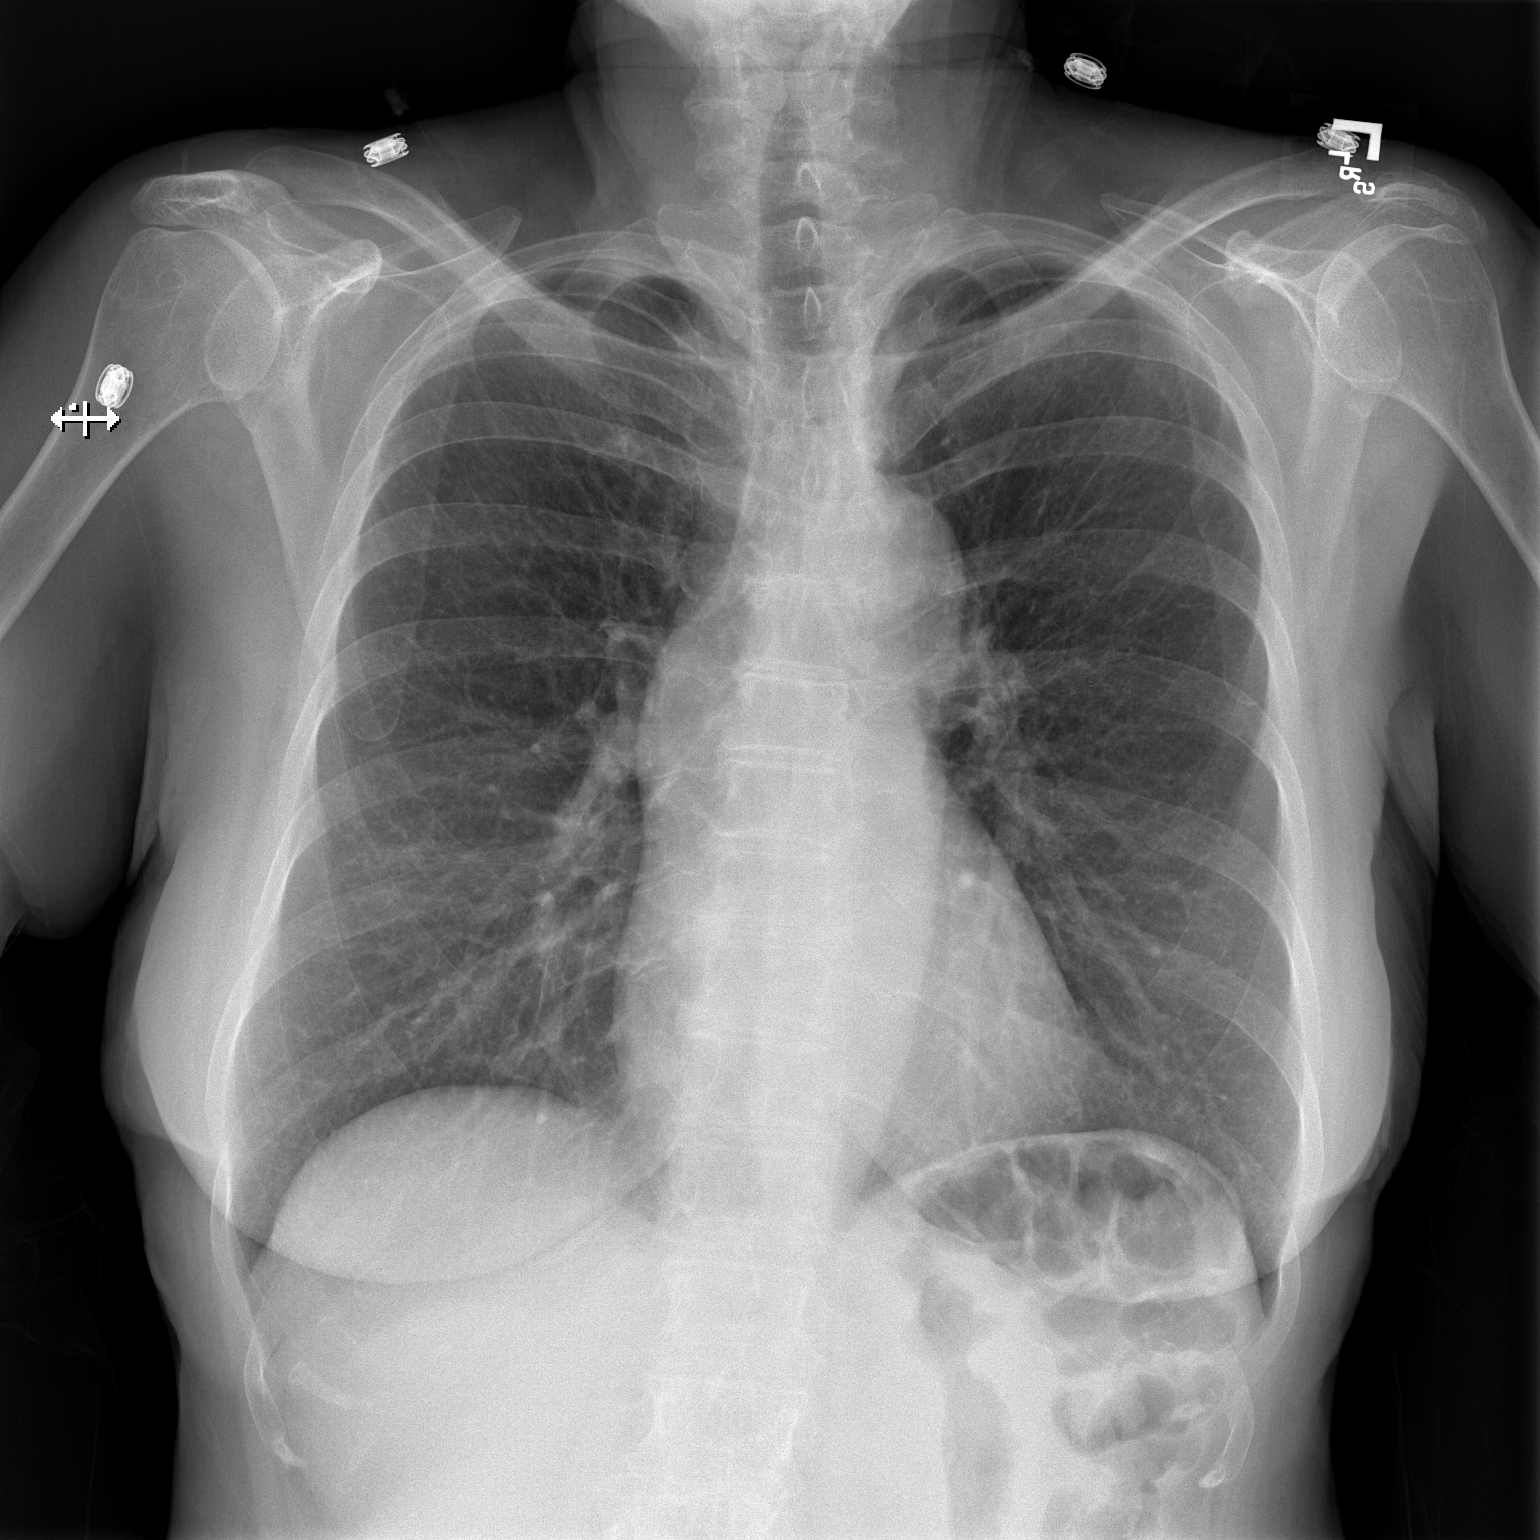

[w chest lat]
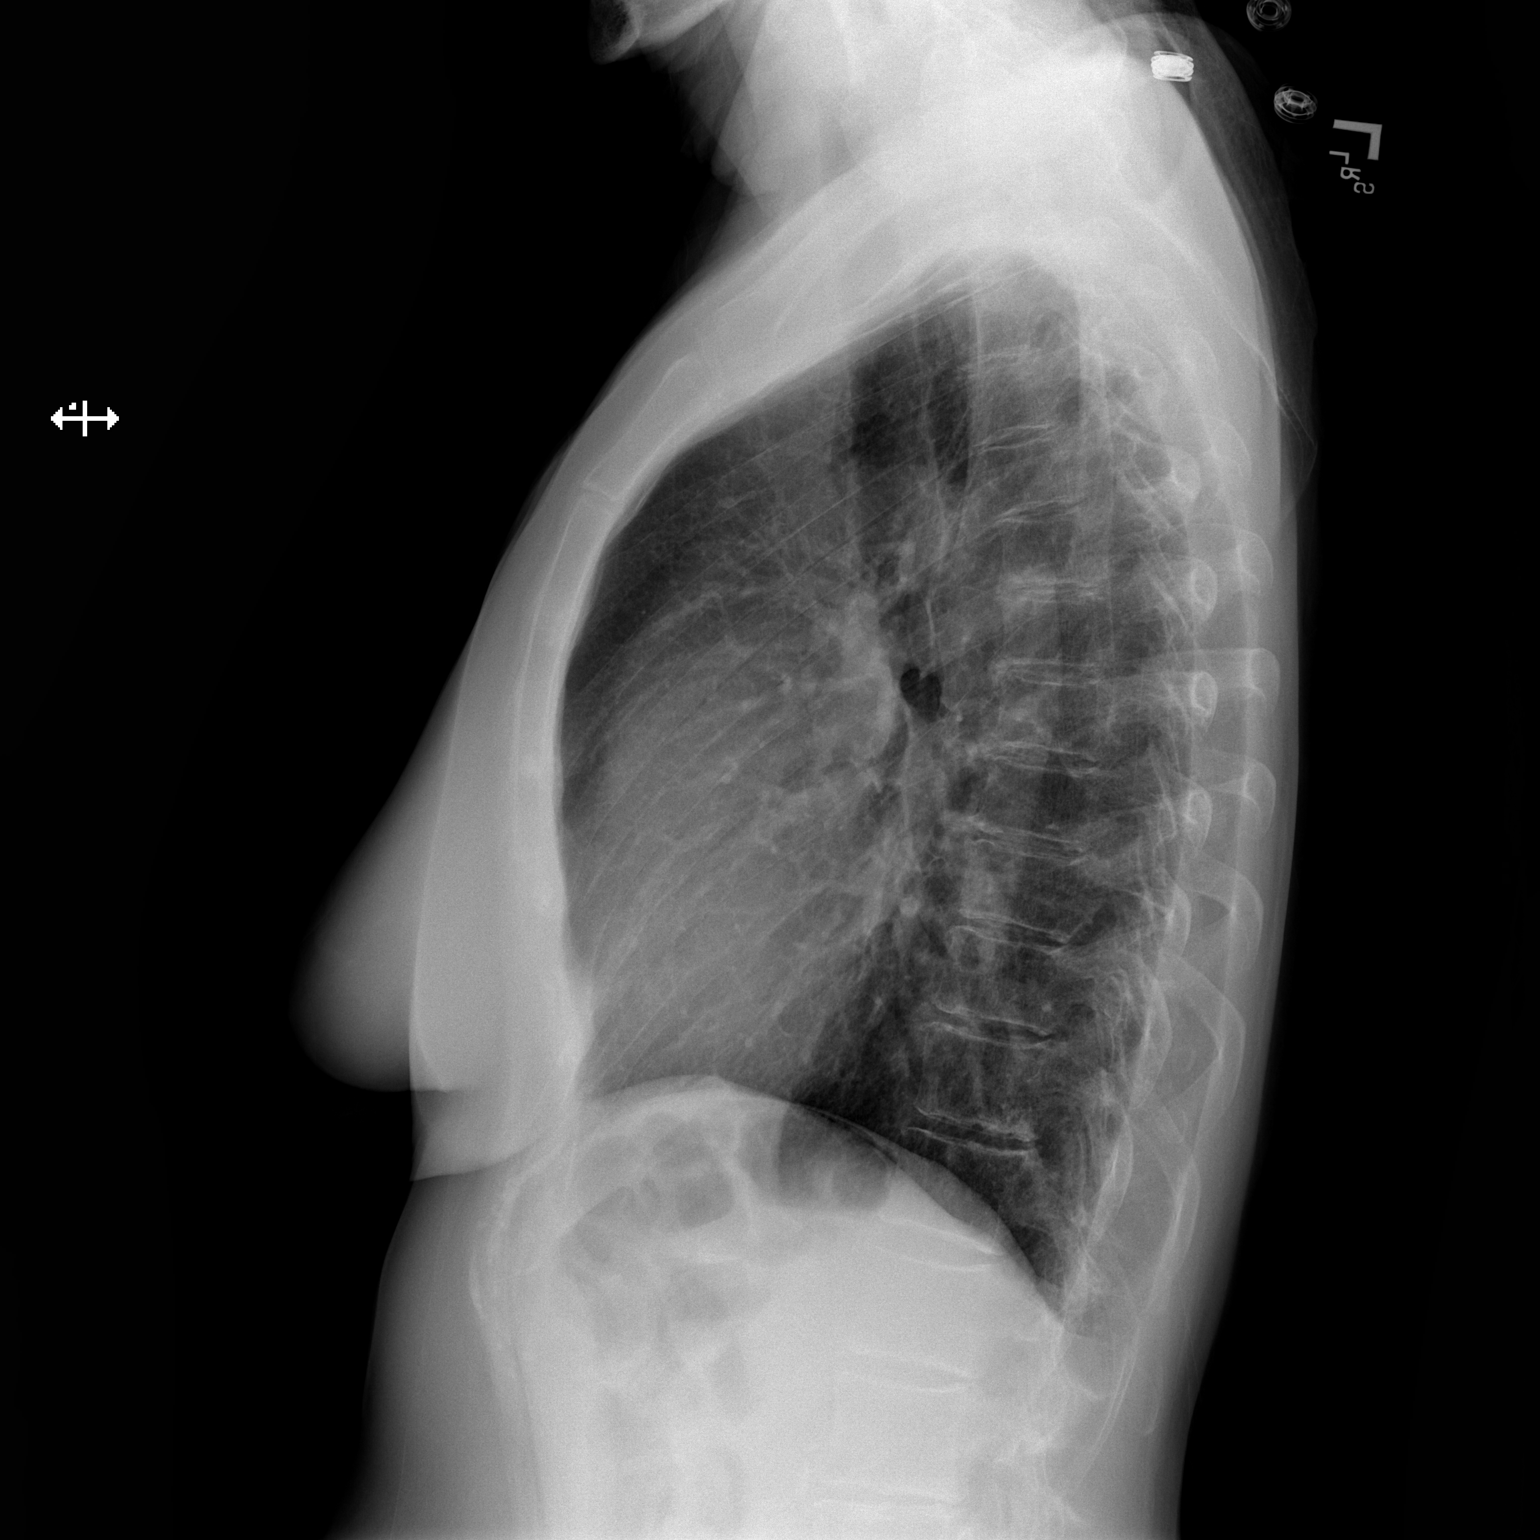

[2 of 2 positions shown; findings below may reference images not displayed]

FINDINGS: The heart size and mediastinal contours are within normal limits.
Both lungs are clear. The visualized skeletal structures are
unremarkable.
IMPRESSION: No active cardiopulmonary disease.

## 2018-10-23 IMAGING — DX DG CHEST 1V PORT
1 series · 1 of 1 positions shown · non-contrast
Comparison: Chest x-ray dated 09/16/2017.

CLINICAL DATA: Status post lobectomy of lung. History of cancer,
palpitations.

EXAM:
PORTABLE CHEST 1 VIEW

[chest]
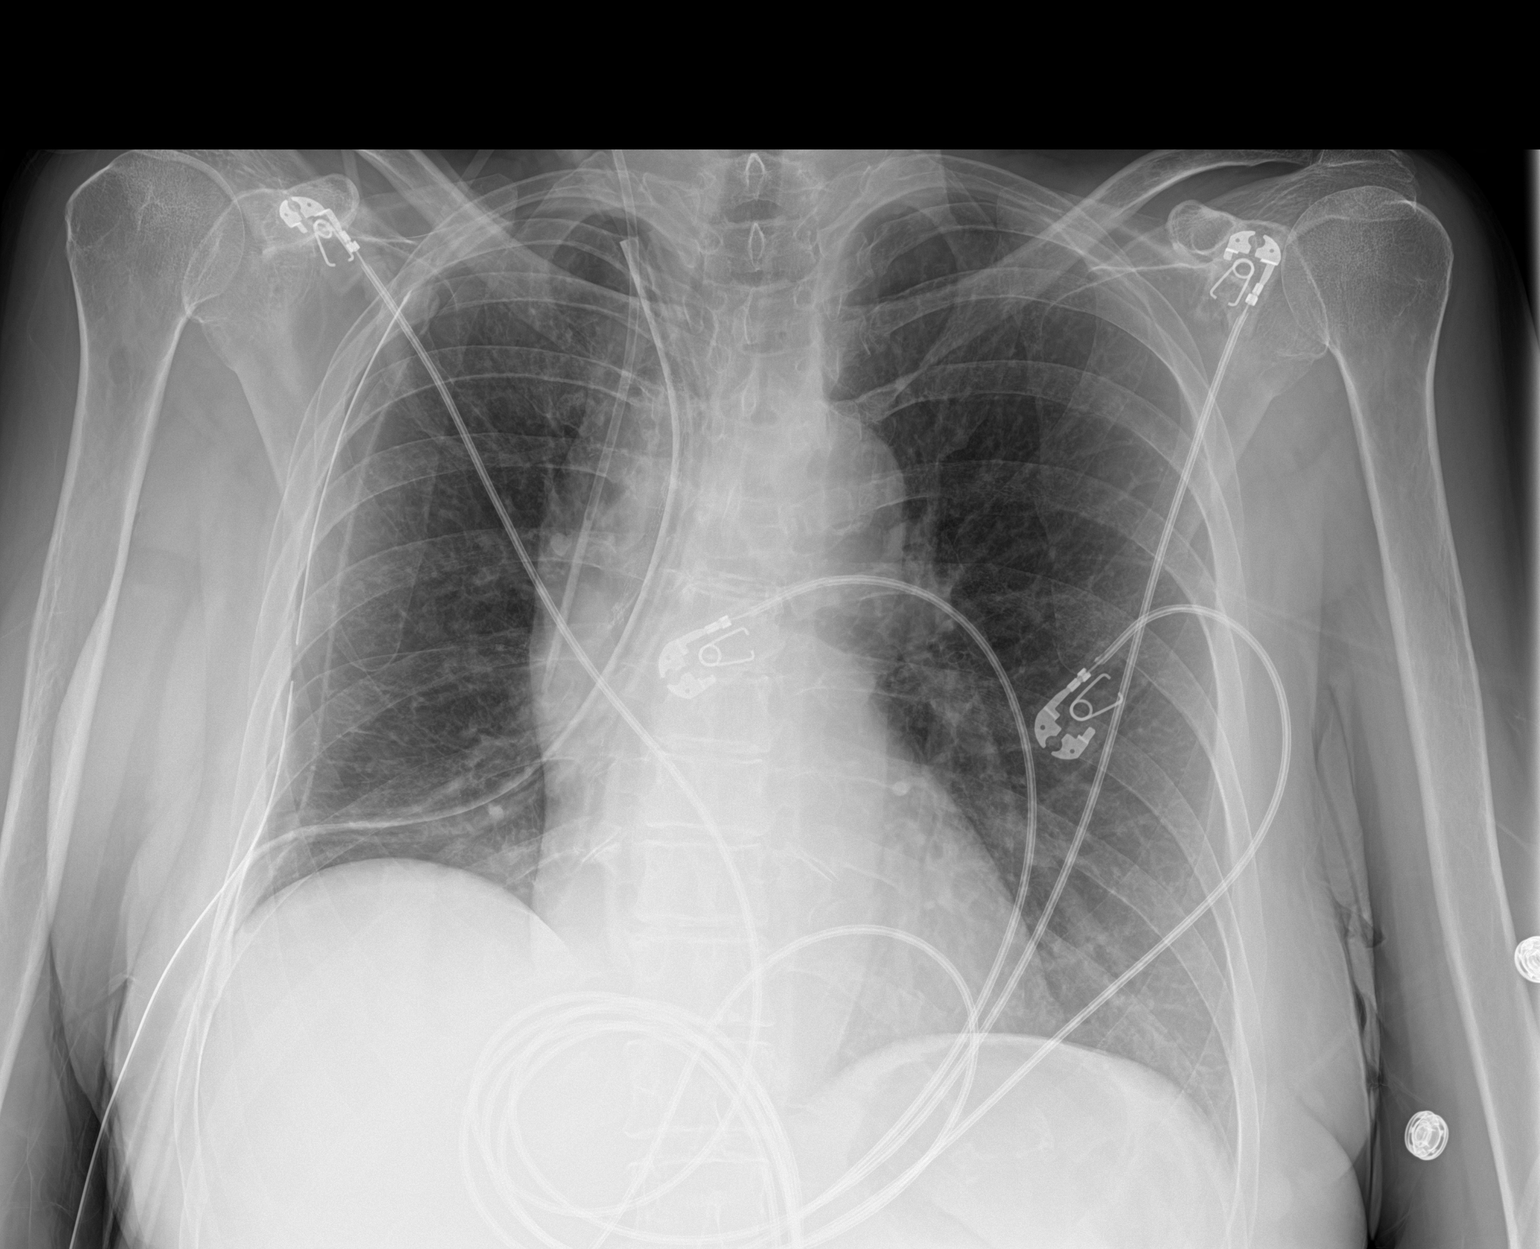

[1 of 1 positions shown; findings below may reference images not displayed]

FINDINGS: Right IJ line is stable in position with tip at the level of the
mid/lower SVC. 2 right-sided chest tubes are stable in position with
tips directed towards the right lung apex. No convincing
pneumothorax seen. Lungs are clear. Heart size and mediastinal
contours are stable.
IMPRESSION: 1. No acute findings. Lungs are clear. No pleural effusion or
pneumothorax seen.
2. Postsurgical changes right lung. Support apparatus appears stable
in position.

## 2018-11-03 ENCOUNTER — Other Ambulatory Visit: Payer: Medicare Other

## 2018-11-05 ENCOUNTER — Ambulatory Visit: Payer: Medicare Other | Admitting: Internal Medicine

## 2018-11-11 IMAGING — CR DG CHEST 2V
2 series · 2 of 2 positions shown · non-contrast
Comparison: September 23, 2017

CLINICAL DATA: Recent VATS procedure for lung carcinoma

EXAM:
CHEST - 2 VIEW

[w chest pa]
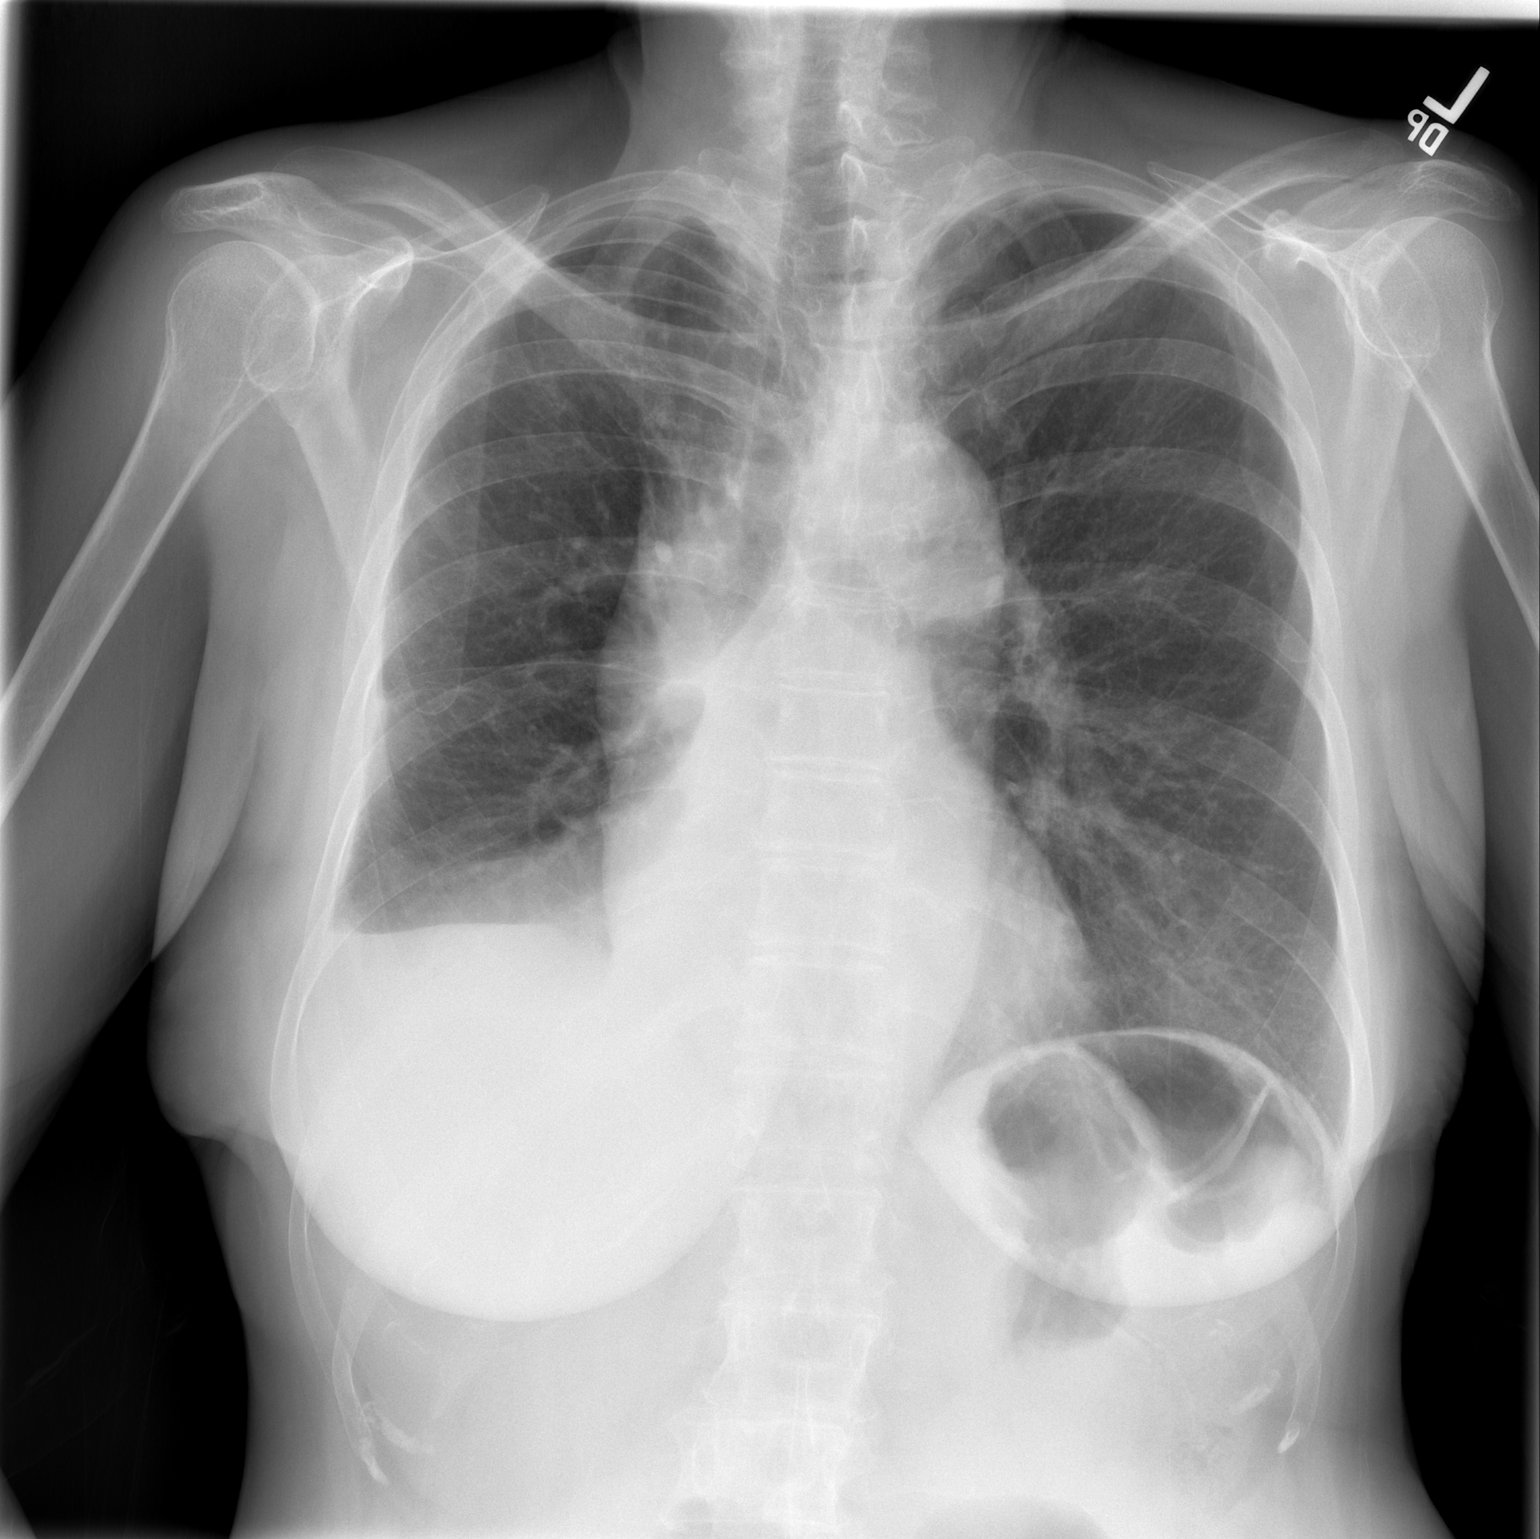

[w chest lat]
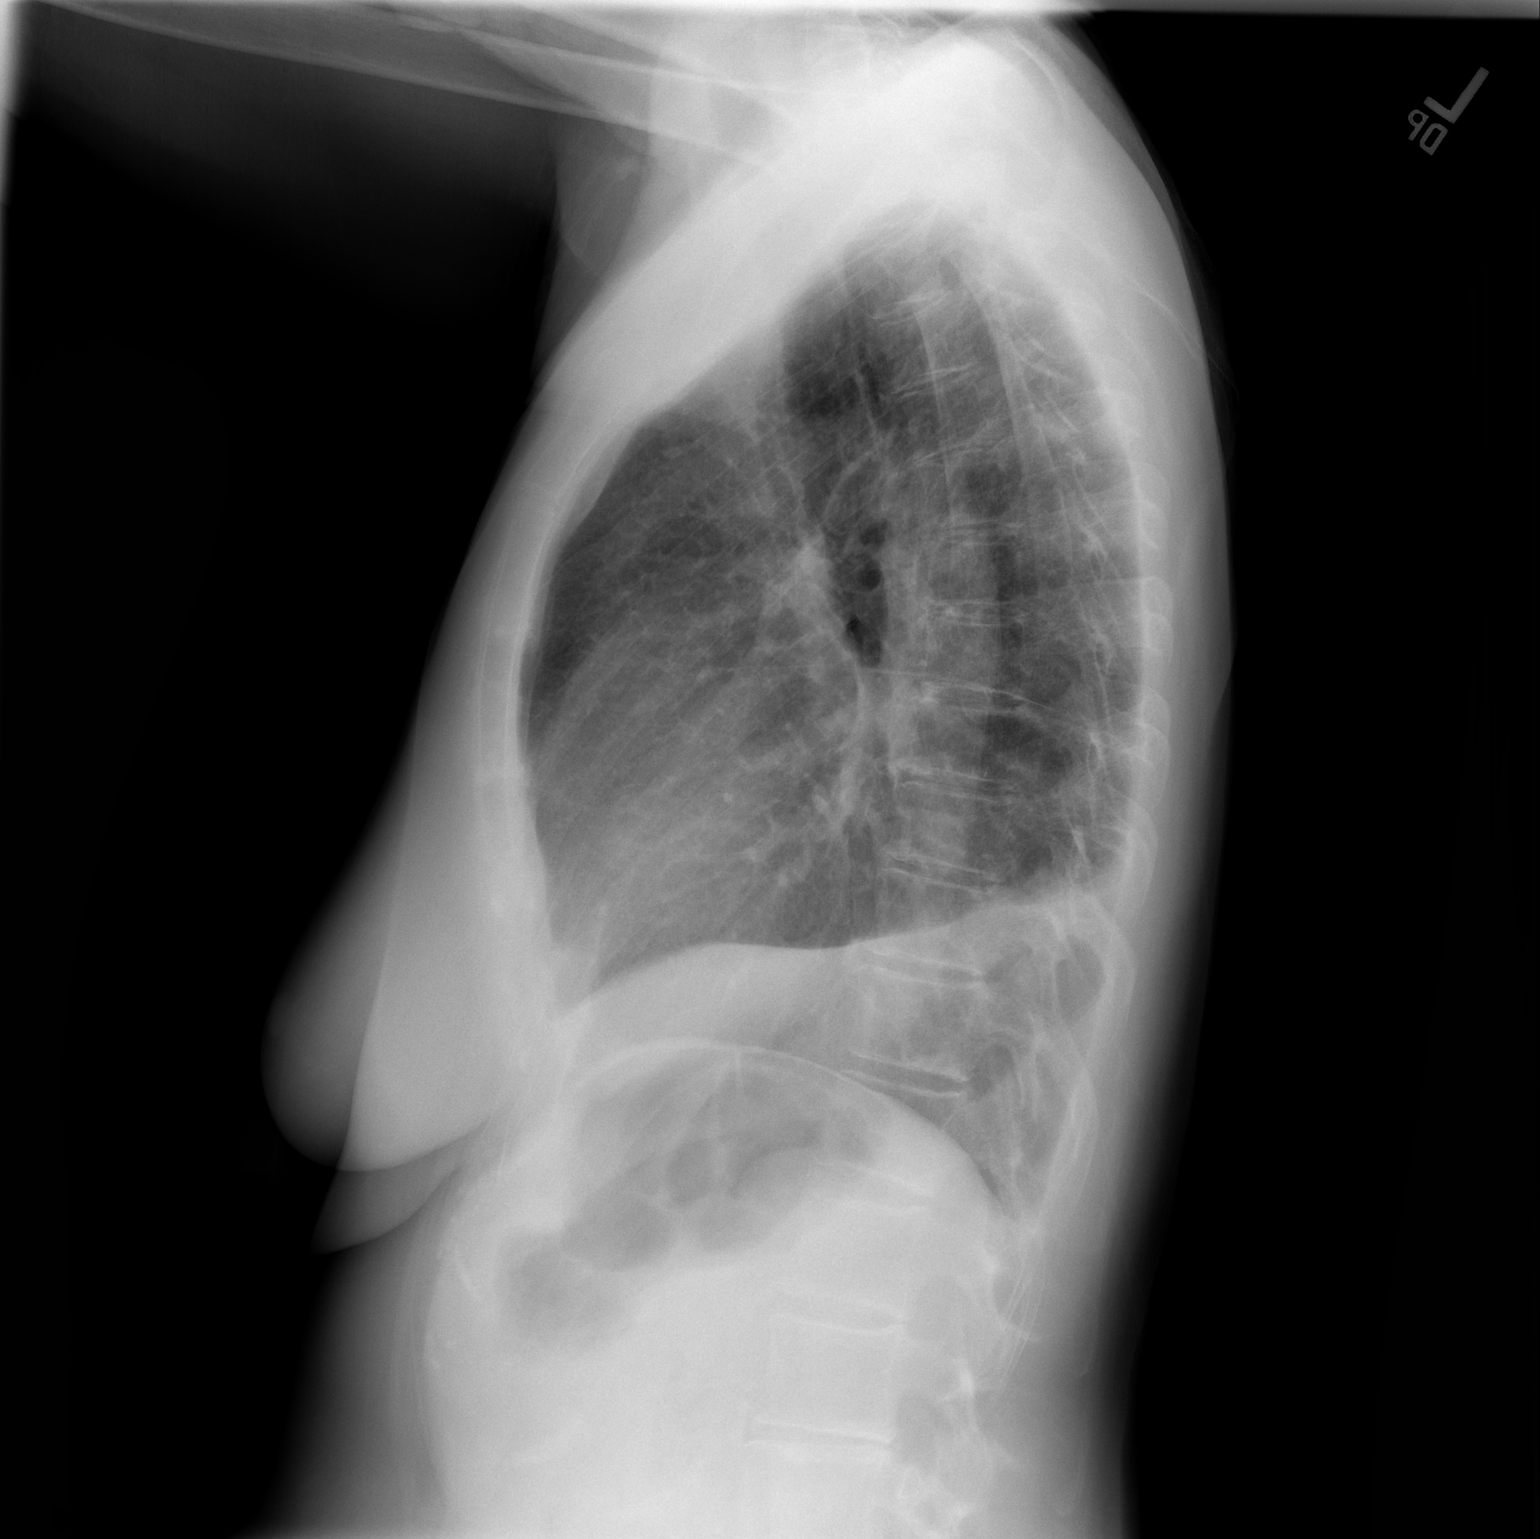

[2 of 2 positions shown; findings below may reference images not displayed]

FINDINGS: There is no longer appreciable subcutaneous air on the right.
Previously noted right-sided pneumothorax is no longer appreciable.
There is a right pleural effusion with atelectatic change in the
right base. Lungs elsewhere are clear. Heart size and pulmonary
vascularity are normal. No adenopathy. No bone lesions.
IMPRESSION: Right pleural effusion with right base atelectasis. No edema or
consolidation evident. Interval resolution of right-sided
pneumothorax and right subcutaneous air. Stable cardiac silhouette.

## 2018-11-17 ENCOUNTER — Other Ambulatory Visit: Payer: Self-pay | Admitting: Cardiothoracic Surgery

## 2018-11-17 ENCOUNTER — Other Ambulatory Visit: Payer: Self-pay

## 2018-11-17 ENCOUNTER — Ambulatory Visit (HOSPITAL_COMMUNITY)
Admission: RE | Admit: 2018-11-17 | Discharge: 2018-11-17 | Disposition: A | Discharge status: Home / Self Care | Payer: Medicare Other | Source: Ambulatory Visit | Attending: Cardiothoracic Surgery | Admitting: Cardiothoracic Surgery

## 2018-11-17 ENCOUNTER — Ambulatory Visit (HOSPITAL_COMMUNITY): Payer: Medicare Other

## 2018-11-17 ENCOUNTER — Inpatient Hospital Stay: Payer: Medicare Other | Attending: Internal Medicine

## 2018-11-17 ENCOUNTER — Encounter (HOSPITAL_COMMUNITY): Payer: Self-pay

## 2018-11-17 DIAGNOSIS — C760 Malignant neoplasm of head, face and neck: Secondary | ICD-10-CM | POA: Insufficient documentation

## 2018-11-17 DIAGNOSIS — C3431 Malignant neoplasm of lower lobe, right bronchus or lung: Secondary | ICD-10-CM

## 2018-11-17 DIAGNOSIS — C3490 Malignant neoplasm of unspecified part of unspecified bronchus or lung: Secondary | ICD-10-CM

## 2018-11-17 HISTORY — DX: Malignant neoplasm of unspecified part of unspecified bronchus or lung: C34.90

## 2018-11-17 LAB — CMP (CANCER CENTER ONLY)
ALT: 20 U/L (ref 0–44)
AST: 19 U/L (ref 15–41)
Albumin: 3.7 g/dL (ref 3.5–5.0)
Alkaline Phosphatase: 105 U/L (ref 38–126)
Anion gap: 6 (ref 5–15)
BUN: 9 mg/dL (ref 8–23)
CO2: 23 mmol/L (ref 22–32)
Calcium: 9.1 mg/dL (ref 8.9–10.3)
Chloride: 108 mmol/L (ref 98–111)
Creatinine: 0.74 mg/dL (ref 0.44–1.00)
GFR, Est AFR Am: >60 mL/min (ref >60)
GFR, Estimated: >60 mL/min (ref >60)
Glucose, Bld: 82 mg/dL (ref 70–99)
Potassium: 4.3 mmol/L (ref 3.5–5.1)
Sodium: 137 mmol/L (ref 135–145)
Total Bilirubin: 0.4 mg/dL (ref 0.3–1.2)
Total Protein: 6.9 g/dL (ref 6.5–8.1)

## 2018-11-17 LAB — CBC WITH DIFFERENTIAL (CANCER CENTER ONLY)
Abs Immature Granulocytes: 0.02 10*3/uL (ref 0.00–0.07)
Basophils Absolute: 0 10*3/uL (ref 0.0–0.1)
Basophils Relative: 0 %
Eosinophils Absolute: 0.3 10*3/uL (ref 0.0–0.5)
Eosinophils Relative: 3 %
HCT: 39.3 % (ref 36.0–46.0)
Hemoglobin: 12.7 g/dL (ref 12.0–15.0)
Immature Granulocytes: 0 %
Lymphocytes Relative: 26 %
Lymphs Abs: 2.1 10*3/uL (ref 0.7–4.0)
MCH: 30 pg (ref 26.0–34.0)
MCHC: 32.3 g/dL (ref 30.0–36.0)
MCV: 92.9 fL (ref 80.0–100.0)
Monocytes Absolute: 0.7 10*3/uL (ref 0.1–1.0)
Monocytes Relative: 8 %
Neutro Abs: 5.1 10*3/uL (ref 1.7–7.7)
Neutrophils Relative %: 63 %
Platelet Count: 282 10*3/uL (ref 150–400)
RBC: 4.23 MIL/uL (ref 3.87–5.11)
RDW: 12.8 % (ref 11.5–15.5)
WBC Count: 8.2 10*3/uL (ref 4.0–10.5)
nRBC: 0 % (ref 0.0–0.2)

## 2018-11-17 MED ORDER — IOHEXOL 300 MG/ML  SOLN
75.0000 mL | Freq: Once | INTRAMUSCULAR | Status: AC | PRN
  Administered 2018-11-17: 75 mL via INTRAVENOUS

## 2018-11-17 MED ORDER — SODIUM CHLORIDE (PF) 0.9 % IJ SOLN
INTRAMUSCULAR | Status: AC
  Filled 2018-11-17: qty 50

## 2018-11-19 ENCOUNTER — Encounter: Payer: Self-pay | Admitting: Internal Medicine

## 2018-11-19 ENCOUNTER — Inpatient Hospital Stay (HOSPITAL_BASED_OUTPATIENT_CLINIC_OR_DEPARTMENT_OTHER): Payer: Medicare Other | Admitting: Internal Medicine

## 2018-11-19 ENCOUNTER — Other Ambulatory Visit: Payer: Self-pay

## 2018-11-19 VITALS — BP 127/74 | HR 65 | Temp 98.7°F | Resp 18 | Ht 67.0 in | Wt 144.8 lb

## 2018-11-19 DIAGNOSIS — C3490 Malignant neoplasm of unspecified part of unspecified bronchus or lung: Secondary | ICD-10-CM

## 2018-11-19 DIAGNOSIS — C3431 Malignant neoplasm of lower lobe, right bronchus or lung: Secondary | ICD-10-CM

## 2018-11-19 NOTE — Progress Notes (Signed)
[name]   [phone]  OFFICE PROGRESS NOTE  [name], MD [place] [id number]  DIAGNOSIS: Stage IA (T1b, N0, M0) non-small cell lung cancer, squamous cell carcinoma.  PRIOR THERAPY:  Status post right lower lobectomy with lymph node dissection under the care of Dr. Servando Snare on September 16, 2017.  CURRENT THERAPY: Observation.  INTERVAL HISTORY: Margaret Charles 68 y.o. female returns to the clinic today for follow-up visit.  The patient is feeling fine today with no concerning complaints.  She denied having any chest pain, shortness of breath, cough or hemoptysis.  She denied having any fever or chills.  She has no nausea, vomiting, diarrhea or constipation.  She is here today for evaluation with repeat CT scan of the chest for restaging of her disease.  MEDICAL HISTORY: Past Medical History:  Diagnosis Date   Arthritis    ddd   Chronic headaches    imprved recently per patient    Emphysema of lung ([place])    asymptomatic. noted on CT   History of adenomatous polyp of colon    2015 precancerous polyp history- due spring 2020.  5 years   History of seizures    1980s few ??? seizure activity  (vagal response)   Hypertension    Lung cancer ([place]) dx'd 07/2017   Palpitations    RBBB (right bundle branch block)    palpitations   Skin cancer    skin basal cell cancer    ALLERGIES:  is allergic to codeine and quinolones.  MEDICATIONS:  Current Outpatient Medications  Medication Sig Dispense Refill   guaiFENesin (MUCINEX) 600 MG 12 hr tablet Take by mouth 2 (two) times daily.     levothyroxine (SYNTHROID, LEVOTHROID) 25 MCG tablet Take 1 tablet (25 mcg total) by mouth daily before breakfast. 90 tablet 3   metoprolol tartrate (LOPRESSOR) 25 MG tablet Take 1 tablet (25 mg total) by mouth 2 (two) times daily. 180 tablet 3   Multiple Vitamins-Minerals (CENTRUM ADULTS PO) Take 1 tablet by  mouth daily.     Propylene Glycol-Glycerin (SOOTHE OP) Place 1 drop into both eyes 2 (two) times daily as needed (dry eyes).     No current facility-administered medications for this visit.     SURGICAL HISTORY:  Past Surgical History:  Procedure Laterality Date   BLADDER SURGERY  1985   Long term incontinence. [facility]- used small intestine to build bladder   BREAST BIOPSY  2001   CATARACT EXTRACTION W/ INTRAOCULAR LENS  IMPLANT, BILATERAL     Intestinal blockage  1989 and 1990   bowel obstruction surgery   Lung cancer removal   08/2017   Right lower lobe   VIDEO ASSISTED THORACOSCOPY (VATS)/WEDGE RESECTION Right 09/16/2017   Procedure: VIDEO ASSISTED THORACOSCOPY (VATS)/LUNG RESECTION;  Surgeon: [name], MD;  Location: [facility];  Service: Thoracic;  Laterality: Right;   VIDEO BRONCHOSCOPY N/A 09/16/2017   Procedure: VIDEO BRONCHOSCOPY;  Surgeon: [name], MD;  Location: [facility];  Service: Thoracic;  Laterality: N/A;   WRIST SURGERY     de quervains tenosynovitis. Dr. Maureen Ralphs actually did this.     REVIEW OF SYSTEMS:  A comprehensive review of systems was negative.   PHYSICAL EXAMINATION: General appearance: alert, cooperative and no distress Head: Normocephalic, without obvious abnormality, atraumatic Neck: no adenopathy, no JVD, supple, symmetrical, trachea midline and thyroid not enlarged, symmetric, no tenderness/mass/nodules Lymph nodes: Cervical, supraclavicular, and axillary  nodes normal. Resp: clear to auscultation bilaterally Back: symmetric, no curvature. ROM normal. No CVA tenderness. Cardio: regular rate and rhythm, S1, S2 normal, no murmur, click, rub or gallop GI: soft, non-tender; bowel sounds normal; no masses,  no organomegaly Extremities: extremities normal, atraumatic, no cyanosis or edema  ECOG PERFORMANCE STATUS: 1 - Symptomatic but completely ambulatory  Blood pressure 127/74, pulse 65, temperature 98.7 F (37.1 C), resp. rate 18,  height 5\' 7"  (1.702 m), weight 144 lb 12.8 oz (65.7 kg), SpO2 97 %.  LABORATORY DATA: Lab Results  Component Value Date   WBC 8.2 11/17/2018   HGB 12.7 11/17/2018   HCT 39.3 11/17/2018   MCV 92.9 11/17/2018   PLT 282 11/17/2018      Chemistry      Component Value Date/Time   NA 137 11/17/2018 1058   K 4.3 11/17/2018 1058   CL 108 11/17/2018 1058   CO2 23 11/17/2018 1058   BUN 9 11/17/2018 1058   CREATININE 0.74 11/17/2018 1058      Component Value Date/Time   CALCIUM 9.1 11/17/2018 1058   ALKPHOS 105 11/17/2018 1058   AST 19 11/17/2018 1058   ALT 20 11/17/2018 1058   BILITOT 0.4 11/17/2018 1058       RADIOGRAPHIC STUDIES: Ct Chest W Contrast  Result Date: 11/17/2018 CLINICAL DATA:  Right lung cancer.  Right lateral chest pain. EXAM: CT CHEST WITH CONTRAST TECHNIQUE: Multidetector CT imaging of the chest was performed during intravenous contrast administration. CONTRAST:  12mL OMNIPAQUE IOHEXOL 300 MG/ML  SOLN COMPARISON:  05/04/2018. FINDINGS: Cardiovascular: Atherosclerotic calcification of the aorta and coronary arteries. Heart size normal. No pericardial effusion. Mediastinum/Nodes: No pathologically enlarged mediastinal, hilar or axillary lymph nodes. Esophagus is grossly unremarkable. Lungs/Pleura: Centrilobular emphysema. Right lower lobectomy. Small partially loculated pleural effusion at the base of the right hemithorax, stable. Left lung is clear. Airway is otherwise unremarkable. Upper Abdomen: Visualized portions of the liver, adrenal glands, kidneys, spleen, pancreas, stomach and bowel are grossly unremarkable. No upper abdominal adenopathy. Musculoskeletal: Degenerative changes in the spine. IMPRESSION: 1. No evidence of recurrent or metastatic disease. 2. Small, partially loculated right pleural effusion, stable. 3.  Aortic atherosclerosis (ICD10-170.0). 4.  Emphysema (ICD10-J43.9). Electronically Signed   By: Lorin Picket M.D.   On: 11/17/2018 13:45     ASSESSMENT AND PLAN: This is a very pleasant 68 years old white female with a stage Ia non-small cell lung cancer status post right lower lobectomy with lymph node dissection in March 2019 under the care of Dr. Servando Snare.   The patient is feeling fine today with no concerning complaints. She had repeat CT scan of the chest performed recently.  I personally and independently reviewed the scan images and discussed the results with the patient today.  Her scan showed no concerning findings for disease recurrence. I recommended for her to continue on observation with repeat CT scan of the chest in 6 months. She was advised to call immediately if she has any concerning symptoms in the interval. The patient voices understanding of current disease status and treatment options and is in agreement with the current care plan.  All questions were answered. The patient knows to call the clinic with any problems, questions or concerns. We can certainly see the patient much sooner if necessary.  I spent 10 minutes counseling the patient face to face. The total time spent in the appointment was 15 minutes.  Disclaimer: This note was dictated with voice recognition software. Similar sounding words  can inadvertently be transcribed and may not be corrected upon review.

## 2018-11-20 ENCOUNTER — Telehealth: Payer: Self-pay | Admitting: Internal Medicine

## 2018-11-20 NOTE — Telephone Encounter (Signed)
Scheduled appt per 5/28 los - mailed letter with appt date and time

## 2018-12-03 ENCOUNTER — Other Ambulatory Visit: Payer: Self-pay | Admitting: Cardiovascular Disease

## 2019-01-08 ENCOUNTER — Ambulatory Visit
Admission: RE | Admit: 2019-01-08 | Discharge: 2019-01-08 | Disposition: A | Discharge status: Home / Self Care | Payer: Medicare Other | Source: Ambulatory Visit | Attending: Endocrinology | Admitting: Endocrinology

## 2019-01-08 DIAGNOSIS — E041 Nontoxic single thyroid nodule: Secondary | ICD-10-CM

## 2019-02-04 ENCOUNTER — Ambulatory Visit: Payer: Medicare Other | Admitting: Cardiothoracic Surgery

## 2019-02-04 ENCOUNTER — Encounter: Payer: Self-pay | Admitting: Cardiothoracic Surgery

## 2019-02-04 ENCOUNTER — Other Ambulatory Visit: Payer: Self-pay

## 2019-02-04 VITALS — BP 124/76 | HR 72 | Temp 97.7°F | Resp 16 | Ht 67.0 in | Wt 139.8 lb

## 2019-02-04 DIAGNOSIS — C3431 Malignant neoplasm of lower lobe, right bronchus or lung: Secondary | ICD-10-CM

## 2019-02-04 DIAGNOSIS — Z902 Acquired absence of lung [part of]: Secondary | ICD-10-CM

## 2019-02-04 NOTE — Progress Notes (Signed)
[address]       [place] [address]             [phone]                  Fathima Crail [place] Medical Record #[id number] Date of Birth: [birth year]  Referring FT:[id number], Brayton Mars, MD Primary Cardiology: Primary Care:Hunter, Brayton Mars, MD  Chief Complaint:  Follow Up Visit 09/16/2017  OPERATIVE REPORT PREOPERATIVE DIAGNOSIS:  Right lower lobe lung mass. POSTOPERATIVE DIAGNOSES: 1. Right lower lobe lung mass. 2. Non-small cell carcinoma of the lung. PROCEDURES PERFORMED: 1. Bronchoscopy. 2. Right video-assisted thoracoscopy. 3. Wedge resection, right lower lobe. 4. Completion right lower lobectomy with lymph node dissection. 5. Placement of On-Q. SURGEON:  Lanelle Bal, MD   Cancer Staging Lung cancer, Right lower lobe [facility]) Staging form: Lung, AJCC 8th Edition - Clinical: No stage assigned - Unsigned - Pathologic stage from 09/19/2017: Stage IA2 (pT1b, pN0, cM0) - Signed by [name], MD on 09/19/2017   History of Present Illness:      Patient  returns after resection of stage I a right lower lobe non-small cell lung cancer in March 2019.  She denies any pulmonary symptoms .  She had a follow-up CT scan of the chest and echocardiogram in the spring 2020.  on her scans she does have a mildly dilated ascending aorta, with no associated murmur of aortic insufficiency.  Echocardiogram could not definitively say if she had a bicuspid or trileaflet aortic valve.  He tells me that her sister who lives out of the [place] is also being followed for a dilated ascending aorta, though the patient has no specific details about this. There is no family history of aortic dissection or ascending aortic aneurysm repair  Zubrod Score: At the time of surgery this patient's most appropriate activity status/level should be described as: [x]     0    Normal activity, no symptoms []     1    Restricted in physical strenuous activity but ambulatory,  able to do out light work []     2    Ambulatory and capable of self care, unable to do work activities, up and about                 >50 % of waking hours                                                                                   []     3    Only limited self care, in bed greater than 50% of waking hours []     4    Completely disabled, no self care, confined to bed or chair []     5    Moribund  Social History   Tobacco Use  Smoking Status Former Smoker  . Packs/day: 1.00  . Years: 40.00  . Pack years: 40.00  . Types: Cigarettes  . Quit date: 12/03/2008  . Years since quitting: 10.1  Smokeless Tobacco Never Used       Allergies  Allergen Reactions  . Codeine Nausea And Vomiting  . Quinolones  Patient was warned about not using Cipro and similar antibiotics. Recent studies have raised concern that fluoroquinolone antibiotics could be associated with an increased risk of aortic aneurysm Fluoroquinolones have non-antimicrobial properties that might jeopardise the integrity of the extracellular matrix of the vascular wall In a  propensity score matched cohort study in [place], there was a 66% increased rate of aortic aneurysm or dissection associated with oral fluoroquinolone use, compared wit    Current Outpatient Medications  Medication Sig Dispense Refill  . levothyroxine (SYNTHROID, LEVOTHROID) 25 MCG tablet Take 1 tablet (25 mcg total) by mouth daily before breakfast. 90 tablet 3  . metoprolol tartrate (LOPRESSOR) 25 MG tablet TAKE 1 TABLET BY MOUTH TWICE A DAY 180 tablet 0  . Multiple Vitamins-Minerals (CENTRUM ADULTS PO) Take 1 tablet by mouth daily.    Marland Kitchen Propylene Glycol-Glycerin (SOOTHE OP) Place 1 drop into both eyes 2 (two) times daily as needed (dry eyes).     No current facility-administered medications for this visit.        Physical Exam: BP 124/76 (BP Location: Right Arm, Patient Position: Sitting, Cuff Size: Normal)   Pulse 72   Temp 97.7 F (36.5 C)    Resp 16   Ht 5\' 7"  (1.702 m)   Wt 139 lb 12.8 oz (63.4 kg)   SpO2 97% Comment: RA  BMI 21.90 kg/m  General appearance: alert and cooperative Neck: no adenopathy, no carotid bruit, no JVD, supple, symmetrical, trachea midline and thyroid not enlarged, symmetric, no tenderness/mass/nodules Lymph nodes: Cervical, supraclavicular, and axillary nodes normal. Resp: clear to auscultation bilaterally Cardio: regular rate and rhythm, S1, S2 normal, no murmur, click, rub or gallop GI: soft, non-tender; bowel sounds normal; no masses,  no organomegaly Extremities: extremities normal, atraumatic, no cyanosis or edema Neurologic: Grossly normal  Diagnostic Studies & Laboratory data:         Recent Radiology Findings: CLINICAL DATA:  RIGHT lung cancer diagnosed February 2019. RIGHT lower lobectomy.  EXAM: CT CHEST WITH CONTRAST  TECHNIQUE: Multidetector CT imaging of the chest was performed during intravenous contrast administration.  CONTRAST:  45mL OMNIPAQUE IOHEXOL 300 MG/ML  SOLN  COMPARISON:  PET scan 08/25/2017  FINDINGS: Cardiovascular: 4 cm ascending aorta. No significant vascular findings. Normal heart size. No pericardial effusion.  Mediastinum/Nodes: No axillary or supraclavicular adenopathy. No mediastinal adenopathy. Esophagus normal  Lungs/Pleura: RIGHT lower lobectomy. Small moderate RIGHT effusion. No nodularity in the RIGHT lung. Several benign-appearing nodules are present along the superior aspect of the RIGHT oblique fissure.  The LEFT lung is clear.  Upper Abdomen: Limited view of the liver, kidneys, pancreas are unremarkable. Normal adrenal glands.  Musculoskeletal: No aggressive osseous lesion  IMPRESSION: 1. No evidence of RIGHT lung cancer recurrence following RIGHT lower lobectomy. 2. RIGHT lower lobe effusion is new from prior. 3. No evidence of metastatic adenopathy.   Electronically Signed   By: Suzy Bouchard M.D.   On:  05/04/2018 17:17  Patient does have a dilated ascending aorta 4.2 cm, appears stable when compared to her previous PET scan.  CLINICAL DATA:  Right lung cancer.  Right lateral chest pain.  EXAM: CT CHEST WITH CONTRAST  TECHNIQUE: Multidetector CT imaging of the chest was performed during intravenous contrast administration.  CONTRAST:  18mL OMNIPAQUE IOHEXOL 300 MG/ML  SOLN  COMPARISON:  05/04/2018.  FINDINGS: Cardiovascular: Atherosclerotic calcification of the aorta and coronary arteries. Heart size normal. No pericardial effusion.  Mediastinum/Nodes: No pathologically enlarged mediastinal, hilar or axillary lymph nodes. Esophagus is grossly  unremarkable.  Lungs/Pleura: Centrilobular emphysema. Right lower lobectomy. Small partially loculated pleural effusion at the base of the right hemithorax, stable. Left lung is clear. Airway is otherwise unremarkable.  Upper Abdomen: Visualized portions of the liver, adrenal glands, kidneys, spleen, pancreas, stomach and bowel are grossly unremarkable. No upper abdominal adenopathy.  Musculoskeletal: Degenerative changes in the spine.  IMPRESSION: 1. No evidence of recurrent or metastatic disease. 2. Small, partially loculated right pleural effusion, stable. 3.  Aortic atherosclerosis (ICD10-170.0). 4.  Emphysema (ICD10-J43.9).   Electronically Signed   By: Lorin Picket M.D.   On: 11/17/2018 13:45  The CT report does not mention the size of the aorta on my measurement it ranges between 3.9 - 4.2 cm unchanged from previous scan.  I have independently reviewed the above radiology findings and reviewed findings  with the patient.  Result status: Final result     ECHOCARDIOGRAM REPORT       Patient Name:   JOELENE BARRIERE Date of Exam: 09/02/2018 Medical Rec #:  [id number]      Height:       67.0 in Accession #:    [id number]     Weight:       146.4 lb Date of Birth:  07/04/1950      BSA:          1.77 m  Patient Age:    68 years       BP:           124/70 mmHg Patient Gender: F              HR:           NA bpm. Exam Location:  [facility]    Procedure: 2D Echo, Cardiac Doppler and Color Doppler  Indications:    I11.9 Hypertensive Heart Disease                 I77.810 Ascending Aortic Dilation   History:        Patient has no prior history of Echocardiogram examinations.                 RBBB; Risk Factors: Hypertension, Dyslipidemia, Family History                 of Coronary Artery Disease and Former Smoker. Right Lung Cancer                 status post Right lower lobectomy, Seizures, Palpitations.   Sonographer:    Deliah Boston RDCS Referring Phys: [phone] PHILIP J NAHSER    Sonographer Comments: Unable to aquire EKG, leads broken IMPRESSIONS    1. The left ventricle has normal systolic function, with an ejection fraction of 55-60%. The cavity size was normal. Left ventricular diastolic Doppler parameters are consistent with impaired relaxation. Indeterminate filling pressures.  2. The right ventricle has normal systolic function. The cavity was normal. There is no increase in right ventricular wall thickness.  3. The aortic valve was not assessed Aortic valve regurgitation is trivial by color flow Doppler.  FINDINGS  Left Ventricle: The left ventricle has normal systolic function, with an ejection fraction of 55-60%. The cavity size was normal. There is no increase in left ventricular wall thickness. Left ventricular diastolic Doppler parameters are consistent with  impaired relaxation. Indeterminate filling pressures Right Ventricle: The right ventricle has normal systolic function. The cavity was normal. There is no increase in right ventricular wall thickness. Left Atrium: left atrial size was normal in size  Right Atrium: right atrial size was normal in size. Right atrial pressure is estimated at 3 mmHg. Interatrial Septum: No atrial level shunt detected by color  flow Doppler. Pericardium: There is no evidence of pericardial effusion. Mitral Valve: The mitral valve is normal in structure. Mitral valve regurgitation is trivial by color flow Doppler. Tricuspid Valve: The tricuspid valve is normal in structure. Tricuspid valve regurgitation is trivial by color flow Doppler. Aortic Valve: The aortic valve was not assessed Aortic valve regurgitation is trivial by color flow Doppler. Pulmonic Valve: The pulmonic valve was normal in structure. Pulmonic valve regurgitation was not assessed by color flow Doppler. Venous: The inferior vena cava is normal in size with greater than 50% respiratory variability.   LEFT VENTRICLE PLAX 2D LVIDd:         3.82 cm  Diastology LVIDs:         2.83 cm  LV e' lateral:   9.46 cm/s LV PW:         1.18 cm  LV E/e' lateral: 7.1 LV IVS:        0.82 cm  LV e' medial:    5.66 cm/s LVOT diam:     2.40 cm  LV E/e' medial:  11.9 LV SV:         32 ml LV SV Index:   18.25 LVOT Area:     4.52 cm  RIGHT VENTRICLE RV S prime:     10.30 cm/s TAPSE (M-mode): 1.3 cm RVSP:           21.8 mmHg  LEFT ATRIUM           Index       RIGHT ATRIUM           Index LA diam:      3.20 cm 1.81 cm/m  RA Pressure: 3 mmHg LA Vol (A4C): 25.6 ml 14.45 ml/m RA Area:     14.20 cm                                   RA Volume:   35.70 ml  20.16 ml/m  AORTIC VALVE LVOT Vmax:   147.00 cm/s LVOT Vmean:  78.300 cm/s LVOT VTI:    0.217 m   AORTA Ao Root diam: 3.70 cm Ao Asc diam:  3.00 cm  MITRAL VALVE              TRICUSPID VALVE MV Area (PHT): cm        TR Peak grad:   18.8 mmHg MV PHT:        msec       TR Vmax:        217.00 cm/s MV Decel Time: 232 msec   RVSP:           21.8 mmHg MV E velocity: 67.40 cm/s MV A velocity: 87.30 cm/s SHUNTS MV E/A ratio:  0.77       Systemic VTI:  0.22 m                           Systemic Diam: 2.40 cm    Skeet Latch MD Electronically signed by Skeet Latch MD Signature Date/Time:  09/02/2018/9:56:40 PM  Notes recorded by Thayer Headings, MD on 09/07/2018 at 5:54 PM EDT  I have personally reviewed the echo images and the aortic valve was not seen well enough to say  whether its a regular 3 leaflet valve or a bicuspid Av.  It seems to behave like a typical 3 leaflet valve  Will consider a TEE for further evaluation at some point in the future.         Recent Labs: Lab Results  Component Value Date   WBC 8.2 11/17/2018   HGB 12.7 11/17/2018   HCT 39.3 11/17/2018   PLT 282 11/17/2018   GLUCOSE 82 11/17/2018   CHOL 175 03/20/2018   TRIG 103.0 03/20/2018   HDL 57.90 03/20/2018   LDLCALC 96 03/20/2018   ALT 20 11/17/2018   AST 19 11/17/2018   NA 137 11/17/2018   K 4.3 11/17/2018   CL 108 11/17/2018   CREATININE 0.74 11/17/2018   BUN 9 11/17/2018   CO2 23 11/17/2018   TSH 5.17 (H) 08/31/2018   INR 0.97 09/11/2017   Aortic Size Index=    4.2     /Body surface area is 1.73 meters squared. = 2.38  < 2.75 cm/m2      4% risk per year 2.75 to 4.25          8% risk per year > 4.25 cm/m2    20% risk per year     Assessment / Plan:   #1 status post right lower lobectomy for stage I non-small cell carcinoma of the lung, without evidence of recurrence on CT scan may 2020, to have follow-up scan in November of this year.     #2 incidentally noted ascending aorta 4.2 cm, no murmur associated with aortic insufficiency,-unchanged on follow-up CT scan continue to monitor    [name] 02/04/2019 11:38 AM

## 2019-02-18 ENCOUNTER — Encounter: Payer: Self-pay | Admitting: Family Medicine

## 2019-02-18 ENCOUNTER — Ambulatory Visit: Payer: Medicare Other | Admitting: Family Medicine

## 2019-02-18 ENCOUNTER — Other Ambulatory Visit: Payer: Self-pay

## 2019-02-18 VITALS — BP 110/80 | HR 78 | Temp 98.0°F | Ht 67.0 in | Wt 142.0 lb

## 2019-02-18 DIAGNOSIS — E039 Hypothyroidism, unspecified: Secondary | ICD-10-CM

## 2019-02-18 DIAGNOSIS — R590 Localized enlarged lymph nodes: Secondary | ICD-10-CM | POA: Diagnosis not present

## 2019-02-18 DIAGNOSIS — Z1211 Encounter for screening for malignant neoplasm of colon: Secondary | ICD-10-CM

## 2019-02-18 DIAGNOSIS — R0982 Postnasal drip: Secondary | ICD-10-CM

## 2019-02-18 DIAGNOSIS — I10 Essential (primary) hypertension: Secondary | ICD-10-CM

## 2019-02-18 DIAGNOSIS — I7121 Aneurysm of the ascending aorta, without rupture: Secondary | ICD-10-CM

## 2019-02-18 DIAGNOSIS — I712 Thoracic aortic aneurysm, without rupture: Secondary | ICD-10-CM

## 2019-02-18 NOTE — Progress Notes (Signed)
Phone [phone]   Subjective:  Margaret Charles is a 68 y.o. year old very pleasant female patient who presents for/with See problem oriented charting Chief Complaint  Patient presents with  . Follow-up  . Hypertension  . Adenopathy   ROS-  Denies dizziness, CP, SOB, visual changes.  No fever/chills  Past Medical History-  Patient Active Problem List   Diagnosis Date Noted  . Ascending aortic aneurysm ([place]) 08/27/2018    Priority: High  . Paroxysmal atrial fibrillation (HCC) 06/10/2018    Priority: High  . History of atrial fibrillation 12/29/2017    Priority: High  . Alcoholism in remission ([place]) 12/29/2017    Priority: High  . Emphysema of lung ([place])     Priority: High  . S/P lobectomy of lung 09/16/2017    Priority: High  . Lung cancer, Right lower lobe ([place]) 08/06/2017    Priority: High  . Hypertension 12/29/2017    Priority: Medium  . History of skin cancer 12/29/2017    Priority: Medium  . Chronic headaches     Priority: Medium  . Left thyroid nodule 10/07/2017    Priority: Medium  . Palpitations 03/25/2017    Priority: Medium  . Lens replaced by other means 08/12/2017    Priority: Low  . Tear film insufficiency 08/12/2017    Priority: Low  . Vitreous degeneration 08/12/2017    Priority: Low  . Cervicogenic headache 08/11/2017    Priority: Low  . DDD (degenerative disc disease), cervical 08/11/2017    Priority: Low  . Arthritis of facet joint of cervical spine 07/28/2017    Priority: Low  . RBBB 07/11/2015    Priority: Low  . Senile nuclear sclerosis 12/25/2011    Priority: Low  . Myopia 12/10/2011    Priority: Low  . Benign paroxysmal positional vertigo of right ear 05/25/2018    Medications- reviewed and updated Current Outpatient Medications  Medication Sig Dispense Refill  . levothyroxine (SYNTHROID, LEVOTHROID) 25 MCG tablet Take 1 tablet (25 mcg total) by mouth daily before breakfast. 90 tablet 3  . metoprolol tartrate (LOPRESSOR) 25 MG  tablet TAKE 1 TABLET BY MOUTH TWICE A DAY 180 tablet 0  . Multiple Vitamins-Minerals (CENTRUM ADULTS PO) Take 1 tablet by mouth daily.    Marland Kitchen Propylene Glycol-Glycerin (SOOTHE OP) Place 1 drop into both eyes 2 (two) times daily as needed (dry eyes).     No current facility-administered medications for this visit.      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 Has not bene approached about virtual option. Doing some classes through zoom with [employer]. Misses going to [employer]. Staying busy but misses certain things.   #hypertension S: controlled on Taking Metoprolol 25 mg daily. Checks BP at home. Staying consistently below 140/90. Reports occasional HA, likely related to neck pain.Salt in moderation. Walks daily. Was doing water aerobics at the [facility] before pandemic.  BP Readings from  Last 3 Encounters:  02/18/19 110/80  02/04/19 124/76  11/19/18 127/74  A/P: Reasonable control-continue current medications  #Aortic aneurysm-discovered in work-up for lung cancer (no evidence of lung cancer at this point) S:CT of the chest reveals 4.2 cm ascending aorta-discovered during follow-up for lung cancer status post lobectomy A/P: Patient will continue scans for lung cancer follow-up and this will also cover aneurysm follow-up-she is receiving these every 6 months.  She knows to avoid quinolones and we have listed as an allergy.  We also discussed ideally keeping blood pressure  less than 130/80 due to aneurysm  #Throat tickle/postnasal drip S: Patient complains of intermittent issues with postnasal drip-occasionally irritates her throat and causes a slight cough.  She also wonders about possible reflux- intermittent cough seemed to start sometime after her lobectomy of lung A/P: Postnasal drip could be from allergies or GERD-we will trial the below treatment From AVS "  1. Try flonase 1 spray each nostril for at least 3 weeks. If no improvement add claritin or allegra at night for 2 weeks. This treatment approach is for allergies.  2. If the above doesn't help could add pepcid twice a day otc for reflux for at least 3 weeks  "  #Hypothyroidism S:Taking Levothyroxine 25 mcg daily.  No changes with hair/skin/nails. No heat/cold intolerance. No increased shakiness or anxiety.  Lab Results  Component Value Date   TSH 5.17 (H) 08/31/2018  A/P: Patient was started on low-dose levothyroxine by endocrinology- no recent TSH check.  Patient agrees to updated TSH when she comes for physical  # Lymphadenopathy S:Noted by Dr. Loanne Drilling when scan of thyroid was done.  Patient states she never palpated this.  It was originally noted on CT of the long and then follow-up ultrasound as below-PCP follow-up was recommended  From [place] 01/08/2019 "Patient's palpable area of concern within the left side of the neck appears to correlate with a non pathologically enlarged cervical lymph node. Cervical lymph nodes are prominent bilaterally, though not enlarged by size criteria and thus presumably reactive in etiology. Clinical correlation to resolution is advised." A/P: Due to lung cancer history I think we need to follow lymphadenopathy to resolution- if this does not resolve- likely refer to ENT for their opinion or could consult radiology to see if ultrasound-guided biopsy could be completed possibly-likely start with ENT  Recommended follow up: Physical planned in October already Future  Appointments  Date Time Provider [place]  03/26/2019  8:20 AM [name], MD [facility]  05/21/2019  9:30 AM [facility] ONLY [facility] None  05/24/2019  1:45 PM Curt Bears, MD [facility] None  08/05/2019 10:30 AM [name], MD [facility]    Lab/Order associations:   ICD-10-CM   1. Hypothyroidism, unspecified type  E03.9 CANCELED: TSH  2. Essential hypertension  I10 CANCELED: Lipid panel  3. Ascending aortic aneurysm (HCC)  I71.2   4. Postnasal drip  R09.82   5. Screen for colon cancer  Z12.11 Ambulatory referral to Gastroenterology  6. Cervical lymphadenopathy  R59.0 US Soft Tissue Head/Neck   Return precautions advised.  Garret Reddish, MD

## 2019-02-18 NOTE — Patient Instructions (Addendum)
Flu shot at physical/annual visit  1. Try flonase 1 spray each nostril for at least 3 weeks. If no improvement add claritin or allegra at night for 2 weeks. This treatment approach is for allergies.  2. If the above doesn't help could add pepcid twice a day otc for reflux for at least 3 weeks  We will call you within two weeks about your referral for updated ultrasound. If you do not hear within 3 weeks, give [place] a call.   We will call you within two weeks about your referral to GI. If you do not hear within 3 weeks, give [place] a call.

## 2019-02-18 NOTE — Assessment & Plan Note (Signed)
S:CT of the chest reveals 4.2 cm ascending aorta-discovered during follow-up for lung cancer status post lobectomy A/P: Patient will continue scans for lung cancer follow-up and this will also cover aneurysm follow-up-she is receiving these every 6 months.  She knows to avoid quinolones and we have listed as an allergy.  We also discussed ideally keeping blood pressure less than 130/80 due to aneurysm

## 2019-02-22 ENCOUNTER — Telehealth: Payer: Self-pay | Admitting: Gastroenterology

## 2019-02-22 NOTE — Telephone Encounter (Signed)
Dr. Havery Moros, pt was referred to you for a colonoscopy.  Previous colon and path reports will be sent to you for review.

## 2019-02-25 ENCOUNTER — Other Ambulatory Visit: Payer: Self-pay | Admitting: Cardiovascular Disease

## 2019-03-02 ENCOUNTER — Other Ambulatory Visit: Payer: Medicare Other

## 2019-03-09 ENCOUNTER — Ambulatory Visit
Admission: RE | Admit: 2019-03-09 | Discharge: 2019-03-09 | Disposition: A | Discharge status: Home / Self Care | Payer: Medicare Other | Source: Ambulatory Visit | Attending: Family Medicine | Admitting: Family Medicine

## 2019-03-09 DIAGNOSIS — R590 Localized enlarged lymph nodes: Secondary | ICD-10-CM

## 2019-03-11 ENCOUNTER — Encounter: Payer: Self-pay | Admitting: Gastroenterology

## 2019-03-11 ENCOUNTER — Telehealth: Payer: Self-pay | Admitting: Gastroenterology

## 2019-03-22 NOTE — Progress Notes (Signed)
Phone: [phone]   Subjective:  Patient presents today for their annual physical. Chief complaint-noted.   See problem oriented charting- ROS- full  review of systems was completed and negative except for: Headaches, post nasal drip improving, tinnitus, neck pain   The following were reviewed and entered/updated in epic: Past Medical History:  Diagnosis Date  . Arthritis    ddd  . Chronic headaches    imprved recently per patient   . Emphysema of lung ([place])    asymptomatic. noted on CT  . History of adenomatous polyp of colon    2015 precancerous polyp history- due spring 2020.  5 years  . History of seizures    1980s few ??? seizure activity  (vagal response)  . Hypertension   . Lung cancer ([place]) dx'd 07/2017  . Palpitations   . RBBB (right bundle branch block)    palpitations  . Skin cancer    skin basal cell cancer   Patient Active Problem List   Diagnosis Date Noted  . Ascending aortic aneurysm ([place]) 08/27/2018    Priority: High  . Paroxysmal atrial fibrillation (HCC) 06/10/2018    Priority: High  . History of atrial fibrillation 12/29/2017    Priority: High  . Alcoholism in remission ([place]) 12/29/2017    Priority: High  . Emphysema of lung ([place])     Priority: High  . S/P lobectomy of lung 09/16/2017    Priority: High  . Lung cancer, Right lower lobe ([place]) 08/06/2017    Priority: High  . Hypertension 12/29/2017    Priority: Medium  . History of skin cancer 12/29/2017    Priority: Medium  . Chronic headaches     Priority: Medium  . Left thyroid nodule 10/07/2017    Priority: Medium  . Palpitations 03/25/2017    Priority: Medium  . Lens replaced by other means 08/12/2017    Priority: Low  . Tear film insufficiency 08/12/2017    Priority: Low  . Vitreous degeneration 08/12/2017    Priority: Low  . Cervicogenic headache 08/11/2017    Priority: Low  . DDD (degenerative disc disease), cervical 08/11/2017    Priority: Low  . Arthritis of facet joint  of cervical spine 07/28/2017    Priority: Low  . RBBB 07/11/2015    Priority: Low  . Senile nuclear sclerosis 12/25/2011    Priority: Low  . Myopia 12/10/2011    Priority: Low  . Benign paroxysmal positional vertigo of right ear 05/25/2018   Past Surgical History:  Procedure Laterality Date  . BLADDER SURGERY  1985   Long term incontinence. [facility]- used small intestine to build bladder  . BREAST BIOPSY  2001  . CATARACT EXTRACTION W/ INTRAOCULAR LENS  IMPLANT, BILATERAL    . Intestinal blockage  1989 and 1990   bowel obstruction surgery  . Lung cancer removal   08/2017   Right lower lobe  . VIDEO ASSISTED THORACOSCOPY (VATS)/WEDGE RESECTION Right 09/16/2017   Procedure: VIDEO ASSISTED THORACOSCOPY (VATS)/LUNG RESECTION;  Surgeon: [name], MD;  Location: [facility];  Service: Thoracic;  Laterality: Right;  Marland Kitchen VIDEO BRONCHOSCOPY N/A 09/16/2017   Procedure: VIDEO BRONCHOSCOPY;  Surgeon: [name], MD;  Location: [facility] OR;  Service: Thoracic;  Laterality: N/A;  . WRIST SURGERY     de quervains tenosynovitis. Dr. Maureen Ralphs actually did this.     Family History  Problem Relation Age of Onset  . Dementia Mother   . Heart attack Mother        age  85. after hip fracture.   . Hypertension Mother   . Prostate cancer Father   . Hypertension Father   . Breast cancer Sister   . Hypertension Sister   . Heart attack Sister        74. former smoker  . Heart attack Paternal Grandfather   . Hypertension Sister   . Colon cancer Other        family History  . Colon polyps Other        family history    Medications- reviewed and updated Current Outpatient Medications  Medication Sig Dispense Refill  . levothyroxine (SYNTHROID, LEVOTHROID) 25 MCG tablet Take 1 tablet (25 mcg total) by mouth daily before breakfast. 90 tablet 3  . metoprolol tartrate (LOPRESSOR) 25 MG tablet Take 1 tablet (25 mg total) by mouth 2 (two) times daily. Please make yearly appt with Dr. Acie Fredrickson for December  for future refills. 1st attempt 180 tablet 0  . Multiple Vitamins-Minerals (CENTRUM ADULTS PO) Take 1 tablet by mouth daily.    Marland Kitchen Propylene Glycol-Glycerin (SOOTHE OP) Place 1 drop into both eyes 2 (two) times daily as needed (dry eyes).     No current facility-administered medications for this visit.     Allergies-reviewed and updated Allergies  Allergen Reactions  . Codeine Nausea And Vomiting  . Quinolones     Patient was warned about not using Cipro and similar antibiotics. Recent studies have raised concern that fluoroquinolone antibiotics could be associated with an increased risk of aortic aneurysm Fluoroquinolones have non-antimicrobial properties that might jeopardise the integrity of the extracellular matrix of the vascular wall In a  propensity score matched cohort study in [place], there was a 66% increased rate of aortic aneurysm or dissection associated with oral fluoroquinolone use, compared wit    Social History   Social History Narrative   Single. Lives alone.    Lived in [place] for ome years to help with parents but otherwise in [place] most of life.       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 Oropharynx normal Neck: no thyromegaly CV: RRR no murmurs rubs or gallops Breasts: normal appearance, no masses or tenderness  Lungs: CTAB no crackles, wheeze, rhonchi Abdomen: soft/nontender/nondistended/normal bowel sounds. No rebound or guarding.  Ext: no edema Skin: warm, dry Neuro: grossly normal, moves all extremities, PERRLA   Assessment and Plan   68 y.o. female presenting for annual  physical.  Health Maintenance counseling: 1. Anticipatory guidance: Patient counseled regarding regular dental exams -q6 months, eye exams - yearly,  avoiding smoking and second hand smoke , limiting alcohol to 1 beverage per day- alcohol free history of alcoholism but stopped years ago .   2. Risk factor reduction:  Advised patient of need for regular exercise and diet rich and fruits and vegetables to reduce risk of heart attack and stroke. Exercise- walking 5 days a week for 2 miles. Diet-reasonably healthy diet. .  When she was 29 and drinking weighed over 220 lbs so has made good  long term changes. Feels like she could easily overdue it so remains careful. Remains beef and pork free Wt Readings from Last 3 Encounters:  03/26/19 140 lb 12.8 oz (63.9 kg)  02/18/19 142 lb (64.4 kg)  02/04/19 139 lb 12.8 oz (63.4 kg)  3. Immunizations/screenings/ancillary studies-high-dose flu shot today. She is going to get SHingrix at [facility]- appears that shingrix erroneously entered into chart 03/13/2018- she still needs two.  Immunization History  Administered Date(s) Administered  . Influenza Split 05/09/2011, 04/26/2012, 03/24/2014, 04/22/2014  . Influenza Whole 03/19/2017  . Influenza, High Dose Seasonal PF 04/24/2016, 03/20/2018  . Influenza, Seasonal, Injecte, Preservative Fre 04/11/2015  . Influenza-Unspecified 03/31/2017  . PPD Test 09/13/2014  . Pneumococcal Conjugate-13 06/15/2015  . Pneumococcal Polysaccharide-23 03/19/2017  . Tdap 09/15/2014  . Zoster 06/11/2013  . Zoster Recombinat (Shingrix) 03/13/2018  4. Cervical cancer screening- past age based screening-denies history of abnormal 5. Breast cancer screening-  breast exam normal today and mammogram 08/10/2018 6. Colon cancer screening - December 31, 2013 per [facility] records-she was advised 5-year follow-up due to ongoing first cousins daughter history.  She reports multiple polyps in the past-we do not have a report of pathology. She is seeing  Dr. Havery Moros later this month.   7. Skin cancer screening-see skin surgery center yearly. advised regular sunscreen use. Denies worrisome, changing, or new skin lesions.  8. Birth control/STD check- postmenopausal. single/not sexually active 9. Osteoporosis screening at 71- osteopenia 2017 with worst T score -2.0 in lumbar spine.  She is in agreement to repeat at this time -Former smoker-quit smoking in 2010.  History of lung cancer.  Status of chronic or acute concerns   Post nasal drip improving on flonase - see last visit  Neck tension can cause headaches at times- has tried new positions- pillows, etc. Steroid shots in past but didn't help that much   Low level tinnitus starting after sinus infection last year  Had a lymph node spotted on prior imaging- we did a neck ultrasound and radiology said it was physiologic/normal. Has thyroid nodule followed by Dr. Loanne Drilling- he also started levothyroxine as below  Hypothyroidism-controlled on levothyroxine 36mcg- will update tsh today and adjust if needed. Left thyroid nodule- followed by ultrasound Lab Results  Component Value Date   TSH 5.17 (H) 08/31/2018   HTN- controlled on metoprolol 45mcg. Low side today but no lightheadedness/dizziness  Ascending aortic aneurysm (HCC)- this will be followed up when does lung cancer follow up CT scans- noted to have 4.2 cm aneurysm of ascending aorta.   Paroxysmal atrial fibrillation ([place])- no recurrence of a fib since surgery. Since only occurred with surgery she is not on long term anticoagulation. Sees Dr. Acie Fredrickson in December. She is on metoprolol for palpitations through him but they do not think recurrence of a fib.   Alcoholism in remission [facility])- remains alcohol free! And tobacco free!  Pulmonary emphysema, unspecified emphysema type ([place])- noted on imaging but she really has no limitations.   History of right lower lobe lung cancer- s/p resection. still has pain on right flank- still around  5/10 pain. Worse with turning.   Recommended follow up:  1 year physical. Could do wellness visit in 6 months.  Future Appointments  Date Time Provider [place]  04/15/2019  9:50 AM Armbruster, Carlota Raspberry, MD [facility]  05/21/2019  9:30 AM [facility] ONLY [facility] None  05/24/2019  1:45 PM Curt Bears, MD [facility] None  08/05/2019 10:30 AM [name], MD [facility]  [facility]   Lab/Order associations: fasting   ICD-10-CM   1. Preventative health care  Z00.00 CBC    Comprehensive metabolic panel    Lipid panel    POCT Urinalysis Dipstick (Automated)    TSH    DG Bone Density  2. Ascending aortic aneurysm (HCC)  I71.2   3. Paroxysmal atrial fibrillation (HCC)  I48.0   4. Alcoholism in remission ([place])  F10.21   5. Pulmonary emphysema, unspecified emphysema type ([place])  J43.9   6. Essential hypertension  I10 CBC    Comprehensive metabolic panel    Lipid panel    POCT Urinalysis Dipstick (Automated)  7. Left thyroid nodule  E04.1   8. Osteopenia of lumbar spine  M85.88 DG Bone Density  9. Hypothyroidism, unspecified type  E03.9 TSH   Return precautions advised.  Garret Reddish, MD

## 2019-03-22 NOTE — Patient Instructions (Addendum)
Health Maintenance Due  Topic Date Due  . INFLUENZA VACCINE -today 01/23/2019   Please let us know when you get shingrix vaccination  You can schedule 1 year physical, if you would like to do wellness visit in 6 months with our nurse health coach that would also be reasonable   Schedule your bone density test at check out desk. You may also call directly to X-ray at [phone] to schedule an appointment that is convenient for you.  - located [address] across the street from [facility] - in the basement - you do need an appointment for the bone density tests.   Please stop by lab before you go If you do not have mychart- we will call you about results within 5 business days of [place] receiving them.  If you have mychart- we will send your results within 3 business days of [place] receiving them.  If abnormal or we want to clarify a result, we will call or mychart you to make sure you receive the message.  If you have questions or concerns or don't hear within 5-7 days, please send [place] a message or call us.

## 2019-03-26 ENCOUNTER — Ambulatory Visit (INDEPENDENT_AMBULATORY_CARE_PROVIDER_SITE_OTHER): Payer: Medicare Other | Admitting: Family Medicine

## 2019-03-26 ENCOUNTER — Other Ambulatory Visit: Payer: Self-pay

## 2019-03-26 ENCOUNTER — Encounter: Payer: Self-pay | Admitting: Family Medicine

## 2019-03-26 VITALS — BP 94/62 | HR 64 | Temp 98.0°F | Ht 67.0 in | Wt 140.8 lb

## 2019-03-26 DIAGNOSIS — Z23 Encounter for immunization: Secondary | ICD-10-CM

## 2019-03-26 DIAGNOSIS — I10 Essential (primary) hypertension: Secondary | ICD-10-CM

## 2019-03-26 DIAGNOSIS — Z0000 Encounter for general adult medical examination without abnormal findings: Secondary | ICD-10-CM | POA: Diagnosis not present

## 2019-03-26 DIAGNOSIS — E039 Hypothyroidism, unspecified: Secondary | ICD-10-CM | POA: Diagnosis not present

## 2019-03-26 DIAGNOSIS — I480 Paroxysmal atrial fibrillation: Secondary | ICD-10-CM

## 2019-03-26 DIAGNOSIS — F1021 Alcohol dependence, in remission: Secondary | ICD-10-CM | POA: Diagnosis not present

## 2019-03-26 DIAGNOSIS — I712 Thoracic aortic aneurysm, without rupture: Secondary | ICD-10-CM | POA: Diagnosis not present

## 2019-03-26 DIAGNOSIS — E041 Nontoxic single thyroid nodule: Secondary | ICD-10-CM

## 2019-03-26 DIAGNOSIS — J439 Emphysema, unspecified: Secondary | ICD-10-CM

## 2019-03-26 DIAGNOSIS — I7121 Aneurysm of the ascending aorta, without rupture: Secondary | ICD-10-CM

## 2019-03-26 DIAGNOSIS — M8588 Other specified disorders of bone density and structure, other site: Secondary | ICD-10-CM

## 2019-03-26 LAB — POC URINALSYSI DIPSTICK (AUTOMATED)
Bilirubin, UA: NEGATIVE
Blood, UA: NEGATIVE
Glucose, UA: NEGATIVE
Ketones, UA: NEGATIVE
Leukocytes, UA: NEGATIVE
Nitrite, UA: NEGATIVE
Protein, UA: NEGATIVE
Spec Grav, UA: 1.01 (ref 1.010–1.025)
Urobilinogen, UA: 0.2 E.U./dL
pH, UA: 6.5 (ref 5.0–8.0)

## 2019-03-26 LAB — CBC
HCT: 37.7 % (ref 36.0–46.0)
Hemoglobin: 13 g/dL (ref 12.0–15.0)
MCHC: 34.4 g/dL (ref 30.0–36.0)
MCV: 88.5 fl (ref 78.0–100.0)
Platelets: 296 10*3/uL (ref 150.0–400.0)
RBC: 4.27 Mil/uL (ref 3.87–5.11)
RDW: 13.9 % (ref 11.5–15.5)
WBC: 8.3 10*3/uL (ref 4.0–10.5)

## 2019-03-26 LAB — COMPREHENSIVE METABOLIC PANEL
ALT: 18 U/L (ref 0–35)
AST: 18 U/L (ref 0–37)
Albumin: 4.1 g/dL (ref 3.5–5.2)
Alkaline Phosphatase: 111 U/L (ref 39–117)
BUN: 11 mg/dL (ref 6–23)
CO2: 29 mEq/L (ref 19–32)
Calcium: 9.2 mg/dL (ref 8.4–10.5)
Chloride: 102 mEq/L (ref 96–112)
Creatinine, Ser: 0.74 mg/dL (ref 0.40–1.20)
GFR: 77.69 mL/min (ref >60.00)
Glucose, Bld: 76 mg/dL (ref 70–99)
Potassium: 4.1 mEq/L (ref 3.5–5.1)
Sodium: 138 mEq/L (ref 135–145)
Total Bilirubin: 0.6 mg/dL (ref 0.2–1.2)
Total Protein: 6.6 g/dL (ref 6.0–8.3)

## 2019-03-26 LAB — LIPID PANEL
Cholesterol: 184 mg/dL (ref 0–200)
HDL: 62.9 mg/dL (ref >39.00)
LDL Cholesterol: 104 mg/dL — ABNORMAL HIGH (ref 0–99)
NonHDL: 120.85
Total CHOL/HDL Ratio: 3
Triglycerides: 84 mg/dL (ref 0.0–149.0)
VLDL: 16.8 mg/dL (ref 0.0–40.0)

## 2019-03-26 LAB — TSH: TSH: 22.9 u[IU]/mL — ABNORMAL HIGH (ref 0.35–4.50)

## 2019-03-26 NOTE — Addendum Note (Signed)
Addended by: Francis Dowse T on: 03/26/2019 08:50 AM   Modules accepted: Orders

## 2019-04-01 ENCOUNTER — Other Ambulatory Visit: Payer: Self-pay

## 2019-04-05 ENCOUNTER — Ambulatory Visit: Payer: Medicare Other | Admitting: Endocrinology

## 2019-04-05 ENCOUNTER — Other Ambulatory Visit: Payer: Self-pay

## 2019-04-05 ENCOUNTER — Encounter: Payer: Self-pay | Admitting: Endocrinology

## 2019-04-05 VITALS — BP 110/62 | HR 67 | Ht 67.0 in | Wt 144.4 lb

## 2019-04-05 DIAGNOSIS — E041 Nontoxic single thyroid nodule: Secondary | ICD-10-CM

## 2019-04-05 MED ORDER — LEVOTHYROXINE SODIUM 75 MCG PO TABS: 75.0000 ug | ORAL_TABLET | Freq: Every day | ORAL | 3 refills | Status: DC

## 2019-04-05 NOTE — Progress Notes (Signed)
Subjective:    Patient ID: Margaret Charles, female    DOB: [birth year], 68 y.o.   MRN: [id number]  HPI Pt returns for f/u of MNG (dx'ed early 2019, on a PET CT; no nodule met criteria for bx or f/u, based on [place] criteria; she also has h/o stage 1 SCCA of the lung; she requires a low dosage of synthroid for hypothyroidism; f/u US in 2020 showed nodules were smaller).  she does not notice the goiter. She takes synthroid as rx'ed Past Medical History:  Diagnosis Date  . Arthritis    ddd  . Chronic headaches    imprved recently per patient   . Emphysema of lung ([place])    asymptomatic. noted on CT  . History of adenomatous polyp of colon    2015 precancerous polyp history- due spring 2020.  5 years  . History of seizures    1980s few ??? seizure activity  (vagal response)  . Hypertension   . Lung cancer ([place]) dx'd 07/2017  . Palpitations   . RBBB (right bundle branch block)    palpitations  . Skin cancer    skin basal cell cancer    Past Surgical History:  Procedure Laterality Date  . BLADDER SURGERY  1985   Long term incontinence. [facility]- used small intestine to build bladder  . BREAST BIOPSY  2001  . CATARACT EXTRACTION W/ INTRAOCULAR LENS  IMPLANT, BILATERAL    . Intestinal blockage  1989 and 1990   bowel obstruction surgery  . Lung cancer removal   08/2017   Right lower lobe  . VIDEO ASSISTED THORACOSCOPY (VATS)/WEDGE RESECTION Right 09/16/2017   Procedure: VIDEO ASSISTED THORACOSCOPY (VATS)/LUNG RESECTION;  Surgeon: [name], MD;  Location: [facility];  Service: Thoracic;  Laterality: Right;  Marland Kitchen VIDEO BRONCHOSCOPY N/A 09/16/2017   Procedure: VIDEO BRONCHOSCOPY;  Surgeon: [name], MD;  Location: [facility] OR;  Service: Thoracic;  Laterality: N/A;  . WRIST SURGERY     de quervains tenosynovitis. Dr. Maureen Ralphs actually did this.     Social History   Socioeconomic History  . Marital status: Single    Spouse name: Not on file  . Number of children: Not on file  . Years of  education: Not on file  . Highest education level: Not on file  Occupational History  . Occupation: Retired Tour manager  . Financial resource strain: Not on file  . Food insecurity    Worry: Not on file    Inability: Not on file  . Transportation needs    Medical: Not on file    Non-medical: Not on file  Tobacco Use  . Smoking status: Former Smoker    Packs/day: 1.00    Years: 40.00    Pack years: 40.00    Types: Cigarettes    Quit date: 12/03/2008    Years since quitting: 10.3  . Smokeless tobacco: Never Used  Substance and Sexual Activity  . Alcohol use: Not Currently    Comment: hx  etoh  38 yrs  . Drug use: Never  . Sexual activity: Not Currently  Lifestyle  . Physical activity    Days per week: Not on file    Minutes per session: Not on file  . Stress: Not on file  Relationships  . Social Herbalist on phone: Not on file    Gets together: Not on file    Attends religious service: Not on file    Active member of club  or organization: Not on file    Attends meetings of clubs or organizations: Not on file    Relationship status: Not on file  . Intimate partner violence    Fear of current or ex partner: Not on file    Emotionally abused: Not on file    Physically abused: Not on file    Forced sexual activity: Not on file  Other Topics Concern  . Not on file  Social History Narrative   Single. Lives alone.    Lived in [place] for ome years to help with parents but otherwise in [place] most of life.       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 metoprolol tartrate (LOPRESSOR) 25 MG tablet Take 1 tablet (25 mg total) by mouth 2 (two) times daily. Please make yearly appt with Dr. Acie Fredrickson for  December for future refills. 1st attempt 180 tablet 0  . Multiple Vitamins-Minerals (CENTRUM ADULTS PO) Take 1 tablet by mouth daily.    Marland Kitchen Propylene Glycol-Glycerin (SOOTHE OP) Place 1 drop into both eyes 2 (two) times daily as needed (dry eyes).     No current facility-administered medications on file prior to visit.     Allergies  Allergen Reactions  . Codeine Nausea And Vomiting  . Quinolones     Patient was warned about not using Cipro and similar antibiotics. Recent studies have raised concern that fluoroquinolone antibiotics could be associated with an increased risk of aortic aneurysm Fluoroquinolones have non-antimicrobial properties that might jeopardise the integrity of the extracellular matrix of the vascular wall In a  propensity score matched cohort study in [place], there was a 66% increased rate of aortic aneurysm or dissection associated with oral fluoroquinolone use, compared wit    Family History  Problem Relation Age of Onset  . Dementia Mother   . Heart attack Mother        age 63. after hip fracture.   . Hypertension Mother   . Prostate cancer Father   . Hypertension Father   . Breast cancer Sister   . Hypertension Sister   . Heart attack Sister        42. former smoker  . Heart attack Paternal Grandfather   . Hypertension Sister   . Colon cancer Other        family History  . Colon polyps Other        family history    BP 110/62 (BP Location: Left Arm, Patient Position: Sitting, Cuff Size: Normal)   Pulse 67   Ht _0  (1.702 m)   Wt 144 lb 6.4 oz (65.5 kg)   SpO2 98%   BMI 22.62 kg/m   Review of Systems Denies neck pain and swelling    Objective:   Physical Exam VITAL SIGNS:  See vs page GENERAL: no distress NECK: There is no palpable thyroid enlargement.  No thyroid nodule is palpable.  No palpable lymphadenopathy at the anterior neck.   Lab Results  Component Value Date   TSH 22.90 (H) 03/26/2019       Assessment & Plan:   Hypothyroidism: worse.   Patient Instructions  I have sent a prescription to your pharmacy, to increase the levothyroxine.  Blood tests are requested  for you today.  Please do in 1 month.  We'll let you know about the results.  If these results are good, please come back for a follow-up appointment in 1 year.

## 2019-04-05 NOTE — Patient Instructions (Addendum)
I have sent a prescription to your pharmacy, to increase the levothyroxine.  Blood tests are requested for you today.  Please do in 1 month.  We'll let you know about the results.  If these results are good, please come back for a follow-up appointment in 1 year.

## 2019-04-15 ENCOUNTER — Other Ambulatory Visit: Payer: Self-pay

## 2019-04-15 ENCOUNTER — Encounter: Payer: Self-pay | Admitting: Gastroenterology

## 2019-04-15 ENCOUNTER — Ambulatory Visit: Payer: Medicare Other | Admitting: Gastroenterology

## 2019-04-15 VITALS — BP 98/70 | HR 67 | Temp 97.6°F | Ht 67.0 in | Wt 143.4 lb

## 2019-04-15 DIAGNOSIS — Z1159 Encounter for screening for other viral diseases: Secondary | ICD-10-CM | POA: Diagnosis not present

## 2019-04-15 DIAGNOSIS — Z860100 Personal history of colon polyps, unspecified: Secondary | ICD-10-CM

## 2019-04-15 MED ORDER — SUPREP BOWEL PREP KIT 17.5-3.13-1.6 GM/177ML PO SOLN: ORAL | 0 refills | Status: DC

## 2019-04-15 NOTE — Progress Notes (Signed)
HPI :  68 year old female here for new patient visit to discuss surveillance colonoscopy.  She has had 3 colonoscopies since 2005 as outlined below.  In 2012 she had 3 small adenomas.  A follow-up colonoscopy in 2015 showed a diminutive hyperplastic rectal polyp and no other adenomas.  She was told at that time she warranted another colonoscopy in 5 years.  She states she had an uncle who had colon cancer and another first cousin who had colon cancer.  She denies any first-degree family members with colon cancer.  Her father had multiple adenomatous polyps removed, she does not know the details of this.  She is a former smoker and had lung cancer about 1-1/2 years ago.  She had resection is in remission and doing quite well.  She is had prior small bowel obstructions due to adhesive disease leading to surgeries, the last surgery was about 30 years ago and she is done pretty well without recurrence since that time.  She is a high fiber diet and denies any problems with her bowels.  She has regular bowel movements.  She denies any blood in her stools.  She has gas pains that bother her at time but no abdominal pains routinely.  No weight loss.  She has had some reflux in the past but denies any heartburn or pyrosis now.  She is here to discuss possible colonoscopy.  She was under the impression that she was due for exam at this time.  Colonoscopy 12/16/13 - Dr. Rae Halsted - diminutive rectal polyp, good prep - hyperplastic  Colonoscopy 12/13/10 - 56mm ascending polyp, 2 x rectal polyps - adenomas  Colonoscopy 12/15/03 - sigmoid hyperplastic polyp   Past Medical History:  Diagnosis Date  . Arthritis    ddd  . Chronic headaches    imprved recently per patient   . Emphysema of lung ([place])    asymptomatic. noted on CT  . Family history of colon cancer   . History of adenomatous polyp of colon    2015 precancerous polyp history- due spring 2020.  5 years  . History of seizures    1980s  few ??? seizure activity  (vagal response)  . Hypertension   . Lung cancer ([place]) dx'd 07/2017  . Palpitations   . RBBB (right bundle branch block)    palpitations  . Skin cancer    skin basal cell cancer     Past Surgical History:  Procedure Laterality Date  . BLADDER SURGERY  1985   Long term incontinence. [facility]- used small intestine to build bladder  . BREAST BIOPSY  2001  . CATARACT EXTRACTION W/ INTRAOCULAR LENS  IMPLANT, BILATERAL    . Intestinal blockage  1989 and 1990   bowel obstruction surgery  . Lung cancer removal   08/2017   Right lower lobe  . VIDEO ASSISTED THORACOSCOPY (VATS)/WEDGE RESECTION Right 09/16/2017   Procedure: VIDEO ASSISTED THORACOSCOPY (VATS)/LUNG RESECTION;  Surgeon: [name], MD;  Location: [place];  Service: Thoracic;  Laterality: Right;  Marland Kitchen VIDEO BRONCHOSCOPY N/A 09/16/2017   Procedure: VIDEO BRONCHOSCOPY;  Surgeon: [name], MD;  [facility] OR;  Service: Thoracic;  Laterality: N/A;  . WRIST SURGERY     de quervains tenosynovitis. Dr. Maureen Ralphs actually did this.    Family History  Problem Relation Age of Onset  . Dementia Mother   . Heart attack Mother        age 42. after hip fracture.   . Hypertension Mother   .  Prostate cancer Father   . Hypertension Father   . Breast cancer Sister   . Hypertension Sister   . Heart attack Sister        66. former smoker  . Heart attack Paternal Grandfather   . Hypertension Sister   . Colon cancer Other        family History  . Colon polyps Other        family history   Social History   Tobacco Use  . Smoking status: Former Smoker    Packs/day: 1.00    Years: 40.00    Pack years: 40.00    Types: Cigarettes    Quit date: 12/03/2008    Years since quitting: 10.3  . Smokeless tobacco: Never Used  Substance Use Topics  . Alcohol use: Not Currently    Comment: hx  etoh  38 yrs  . Drug use: Never   Current Outpatient Medications  Medication Sig Dispense Refill  . fluticasone  (FLONASE) 50 MCG/ACT nasal spray Place 1 spray into both nostrils daily.    Marland Kitchen levothyroxine (SYNTHROID) 75 MCG tablet Take 1 tablet (75 mcg total) by mouth daily. 90 tablet 3  . metoprolol tartrate (LOPRESSOR) 25 MG tablet Take 1 tablet (25 mg total) by mouth 2 (two) times daily. Please make yearly appt with Dr. Acie Fredrickson for December for future refills. 1st attempt 180 tablet 0  . Multiple Vitamins-Minerals (CENTRUM ADULTS PO) Take 1 tablet by mouth daily.    Marland Kitchen Propylene Glycol-Glycerin (SOOTHE OP) Place 1 drop into both eyes 2 (two) times daily as needed (dry eyes).     No current facility-administered medications for this visit.    Allergies  Allergen Reactions  . Codeine Nausea And Vomiting  . Quinolones     Patient was warned about not using Cipro and similar antibiotics. Recent studies have raised concern that fluoroquinolone antibiotics could be associated with an increased risk of aortic aneurysm Fluoroquinolones have non-antimicrobial properties that might jeopardise the integrity of the extracellular matrix of the vascular wall In a  propensity score matched cohort study in [place], there was a 66% increased rate of aortic aneurysm or dissection associated with oral fluoroquinolone use, compared wit     Review of Systems: All systems reviewed and negative except where noted in HPI.   Lab Results  Component Value Date   WBC 8.3 03/26/2019   HGB 13.0 03/26/2019   HCT 37.7 03/26/2019   MCV 88.5 03/26/2019   PLT 296.0 03/26/2019    Lab Results  Component Value Date   CREATININE 0.74 03/26/2019   BUN 11 03/26/2019   NA 138 03/26/2019   K 4.1 03/26/2019   CL 102 03/26/2019   CO2 29 03/26/2019    Lab Results  Component Value Date   ALT 18 03/26/2019   AST 18 03/26/2019   ALKPHOS 111 03/26/2019   BILITOT 0.6 03/26/2019     Physical Exam: BP 98/70 (BP Location: Left Arm, Patient Position: Sitting, Cuff Size: Normal)   Pulse 67   Temp 97.6 F (36.4 C) (Oral)   Ht 5'  7" (1.702 m)   Wt 143 lb 6 oz (65 kg)   BMI 22.46 kg/m  Constitutional: Pleasant,well-developed, female in no acute distress. HEENT: Normocephalic and atraumatic. Conjunctivae are normal. No scleral icterus. Neck supple.  Cardiovascular: Normal rate, regular rhythm.  Pulmonary/chest: Effort normal and breath sounds normal. No wheezing, rales or rhonchi. Abdominal: Soft, nondistended, nontender.  There are no masses palpable. No hepatomegaly. Extremities: no  edema Lymphadenopathy: No cervical adenopathy noted. Neurological: Alert and oriented to person place and time. Skin: Skin is warm and dry. No rashes noted. Psychiatric: Normal mood and affect. Behavior is normal.   ASSESSMENT AND PLAN: 68 year old female here for reassessment the following issues:  History of colon polyps - colonoscopy history as outlined above, she had 3 small adenomas in 2012 and her follow-up colonoscopy 3 years later did not show any adenomas.  Based on recently updated surveillance guidelines, she would not warrant another colonoscopy for another 10 years from her last exam, so due in 2025.  I explained to her that surveillance guidelines have intervally changed since her last exam.  However, she is rather anxious about waiting that long given her personal history of lung cancer and that other physicians have told her she needed a colonoscopy in 5 years.  I discussed colonoscopy and anesthesia with her.  I offered her an exam for peace of mind given she strongly wants to proceed at this time, and she wanted to proceed.  Further recommendations pending the results.  She agreed with the plan.    [place] Cellar, MD [facility]

## 2019-04-15 NOTE — Patient Instructions (Signed)
If you are age 69 or older, your body mass index should be between 23-30. Your Body mass index is 22.46 kg/m. If this is out of the aforementioned range listed, please consider follow up with your Primary Care Provider.  If you are age 27 or younger, your body mass index should be between 19-25. Your Body mass index is 22.46 kg/m. If this is out of the aformentioned range listed, please consider follow up with your Primary Care Provider.   To help prevent the possible spread of infection to our patients, communities, and staff; we will be implementing the following measures:  As of now we are not allowing any visitors/family members to accompany you to any upcoming appointments with [facility]. If you have any concerns about this please contact our office to discuss prior to the appointment.   You have been scheduled for a colonoscopy. Please follow written instructions given to you at your visit today.  Please pick up your prep supplies at the pharmacy within the next 1-3 days. If you use inhalers (even only as needed), please bring them with you on the day of your procedure. Your physician has requested that you go to www.startemmi.com and enter the access code given to you at your visit today. This web site gives a general overview about your procedure. However, you should still follow specific instructions given to you by our office regarding your preparation for the procedure.  Thank you for entrusting me with your care and for choosing [facility], Dr. [place] Cellar

## 2019-04-23 ENCOUNTER — Telehealth: Payer: Self-pay | Admitting: Internal Medicine

## 2019-04-23 NOTE — Telephone Encounter (Signed)
Returned patient's phone call regarding rescheduling an appointment, per patient's request 11/27 lab appointment time has been rescheduled.

## 2019-04-30 ENCOUNTER — Other Ambulatory Visit: Payer: Self-pay | Admitting: Endocrinology

## 2019-04-30 DIAGNOSIS — E039 Hypothyroidism, unspecified: Secondary | ICD-10-CM | POA: Insufficient documentation

## 2019-05-04 ENCOUNTER — Encounter: Payer: Self-pay | Admitting: Gastroenterology

## 2019-05-04 ENCOUNTER — Ambulatory Visit (INDEPENDENT_AMBULATORY_CARE_PROVIDER_SITE_OTHER): Payer: Medicare Other

## 2019-05-04 ENCOUNTER — Other Ambulatory Visit: Payer: Self-pay | Admitting: Gastroenterology

## 2019-05-04 DIAGNOSIS — Z1159 Encounter for screening for other viral diseases: Secondary | ICD-10-CM

## 2019-05-05 ENCOUNTER — Other Ambulatory Visit: Payer: Self-pay

## 2019-05-05 ENCOUNTER — Other Ambulatory Visit (INDEPENDENT_AMBULATORY_CARE_PROVIDER_SITE_OTHER): Payer: Medicare Other

## 2019-05-05 DIAGNOSIS — E039 Hypothyroidism, unspecified: Secondary | ICD-10-CM

## 2019-05-05 LAB — SARS CORONAVIRUS 2 (TAT 6-24 HRS): SARS Coronavirus 2: NEGATIVE

## 2019-05-05 LAB — TSH: TSH: 4.02 u[IU]/mL (ref 0.35–4.50)

## 2019-05-05 LAB — T4, FREE: Free T4: 0.9 ng/dL (ref 0.60–1.60)

## 2019-05-06 ENCOUNTER — Encounter: Payer: Self-pay | Admitting: Gastroenterology

## 2019-05-06 ENCOUNTER — Other Ambulatory Visit: Payer: Self-pay

## 2019-05-06 ENCOUNTER — Other Ambulatory Visit: Payer: Medicare Other

## 2019-05-06 ENCOUNTER — Telehealth: Payer: Self-pay

## 2019-05-06 ENCOUNTER — Ambulatory Visit (AMBULATORY_SURGERY_CENTER): Payer: Medicare Other | Admitting: Gastroenterology

## 2019-05-06 VITALS — BP 158/89 | HR 75 | Temp 98.7°F | Resp 17 | Ht 67.0 in | Wt 143.0 lb

## 2019-05-06 DIAGNOSIS — D124 Benign neoplasm of descending colon: Secondary | ICD-10-CM

## 2019-05-06 DIAGNOSIS — D123 Benign neoplasm of transverse colon: Secondary | ICD-10-CM | POA: Diagnosis not present

## 2019-05-06 DIAGNOSIS — Z86010 Personal history of colonic polyps: Secondary | ICD-10-CM

## 2019-05-06 DIAGNOSIS — D122 Benign neoplasm of ascending colon: Secondary | ICD-10-CM | POA: Diagnosis not present

## 2019-05-06 DIAGNOSIS — D125 Benign neoplasm of sigmoid colon: Secondary | ICD-10-CM

## 2019-05-06 MED ORDER — SODIUM CHLORIDE 0.9 % IV SOLN: 500.0000 mL | Freq: Once | INTRAVENOUS | Status: DC

## 2019-05-06 NOTE — Telephone Encounter (Signed)
Called patient to check to see if she is feeling better since her visit earlier today.  She informed me that she was feeling better, had eaten some chicken noodle soup, and took a nap.  I reminded her of the symptoms that she should call the office for and she verbalized understanding.

## 2019-05-06 NOTE — Patient Instructions (Signed)
HANDOUTS PROVIDED ON: POLYPS & DIVERTICULOSIS   THE POLYPS TAKEN TODAY HAVE BEEN SENT FOR PATHOLOGY.  THE RESULTS CAN TAKE 2-3 WEEKS TO RECEIVE.  BASED ON THE RESULTS IS WHEN YOUR NEXT COLONOSCOPY WILL BE RECOMMENDED.  YOU MAY RESUME YOUR PREVIOUS DIET AND MEDICATION SCHEDULE.  [place] YOU FOR ALLOWING [place] TO CARE FOR YOU TODAY!!!  YOU HAD AN ENDOSCOPIC PROCEDURE TODAY AT [facility]:   Refer to the procedure report that was given to you for any specific questions about what was found during the examination.  If the procedure report does not answer your questions, please call your gastroenterologist to clarify.  If you requested that your care partner not be given the details of your procedure findings, then the procedure report has been included in a sealed envelope for you to review at your convenience later.  YOU SHOULD EXPECT: Some feelings of bloating in the abdomen. Passage of more gas than usual.  Walking can help get rid of the air that was put into your GI tract during the procedure and reduce the bloating. If you had a lower endoscopy (such as a colonoscopy or flexible sigmoidoscopy) you may notice spotting of blood in your stool or on the toilet paper. If you underwent a bowel prep for your procedure, you may not have a normal bowel movement for a few days.  Please Note:  You might notice some irritation and congestion in your nose or some drainage.  This is from the oxygen used during your procedure.  There is no need for concern and it should clear up in a day or so.  SYMPTOMS TO REPORT IMMEDIATELY:   Following lower endoscopy (colonoscopy or flexible sigmoidoscopy):  Excessive amounts of blood in the stool  Significant tenderness or worsening of abdominal pains  Swelling of the abdomen that is new, acute  Fever of 100F or higher  For urgent or emergent issues, a gastroenterologist can be reached at any hour by calling [phone].   DIET:  We do recommend a  small meal at first, but then you may proceed to your regular diet.  Drink plenty of fluids but you should avoid alcoholic beverages for 24 hours.  ACTIVITY:  You should plan to take it easy for the rest of today and you should NOT DRIVE or use heavy machinery until tomorrow (because of the sedation medicines used during the test).    FOLLOW UP: Our staff will call the number listed on your records 48-72 hours following your procedure to check on you and address any questions or concerns that you may have regarding the information given to you following your procedure. If we do not reach you, we will leave a message.  We will attempt to reach you two times.  During this call, we will ask if you have developed any symptoms of COVID 19. If you develop any symptoms (ie: fever, flu-like symptoms, shortness of breath, cough etc.) before then, please call [phone].  If you test positive for Covid 19 in the 2 weeks post procedure, please call and report this information to [place].    If any biopsies were taken you will be contacted by phone or by letter within the next 1-3 weeks.  Please call us at [phone] if you have not heard about the biopsies in 3 weeks.    SIGNATURES/CONFIDENTIALITY: You and/or your care partner have signed paperwork which will be entered into your electronic medical record.  These signatures attest to the  fact that that the information above on your After Visit Summary has been reviewed and is understood.  Full responsibility of the confidentiality of this discharge information lies with you and/or your care-partner.

## 2019-05-06 NOTE — Progress Notes (Signed)
Patient underwent colonoscopy, 5 polyps removed. tortous colon, required abdominal pressure. She is having some discomfort and distension following the exam. Abdomen soft, vitals stable. She gets some relief with passing flatus and belching. She reports prior small bowel obstructions with small bowel resections in the past. She has had episodes of distension and pain like this periodically on a fairly routine basis. Her symptoms are probably from retained air. She is feeling somewhat better after passing some gas and Levzin. As long as she continues to improve I think safe to discharge home. If she has worsening pain, vomiting, fever, etc, she needs to contact us immediately or go to the ED. She agreed.

## 2019-05-06 NOTE — Progress Notes (Addendum)
On admission patient complains of abd discomfort 5-6/10. IV completed Dr Wilma Flavin in and examined the patient. Levsin .25  Sl. Turned on right side with trendelenberg- large amt of belching 1220Turned back to left lateral large amt of flatus and belching, Dr Wilma Flavin in to exam the patient. Still 5/10. Abd soft 1235Attempted hands and knees position in bed, returned to rt lateral with trendelenberg- continues to pass a large amt of flatus and belching, yet pain remains at 5-7/10. calledDr Armbruester to inform.

## 2019-05-06 NOTE — Progress Notes (Signed)
Pt's states no medical or surgical changes since previsit or office visit.  Covid- June Vitals- Margaret Charles

## 2019-05-06 NOTE — Op Note (Signed)
[facility] Patient Name: Margaret Charles Procedure Date: 05/06/2019 11:09 AM MRN: [id number] Endoscopist: Remo Lipps P. Havery Moros , MD Age: 68 Referring MD:  Date of Birth: [birth year] Gender: Female Account #: [id number] Procedure:                Colonoscopy Indications:              High risk colon cancer surveillance: Personal                            history of colonic polyps Medicines:                Monitored Anesthesia Care Procedure:                Pre-Anesthesia Assessment:                           - Prior to the procedure, a History and Physical                            was performed, and patient medications and                            allergies were reviewed. The patient's tolerance of                            previous anesthesia was also reviewed. The risks                            and benefits of the procedure and the sedation                            options and risks were discussed with the patient.                            All questions were answered, and informed consent                            was obtained. Prior Anticoagulants: The patient has                            taken no previous anticoagulant or antiplatelet                            agents. ASA Grade Assessment: III - A patient with                            severe systemic disease. After reviewing the risks                            and benefits, the patient was deemed in                            satisfactory condition to undergo the procedure.  After obtaining informed consent, the colonoscope                            was passed under direct vision. Throughout the                            procedure, the patient's blood pressure, pulse, and                            oxygen saturations were monitored continuously. The                            Colonoscope was introduced through the anus and                            advanced to the the cecum,  identified by                            appendiceal orifice and ileocecal valve. The                            colonoscopy was technically difficult and complex                            due to a tortuous colon. The patient tolerated the                            procedure well. The quality of the bowel                            preparation was adequate. The ileocecal valve,                            appendiceal orifice, and rectum were photographed. Scope In: 11:22:25 AM Scope Out: 12:00:51 PM Scope Withdrawal Time: 0 hours 28 minutes 5 seconds  Total Procedure Duration: 0 hours 38 minutes 26 seconds  Findings:                 The perianal and digital rectal examinations were                            normal.                           A 7 mm polyp was found in the ascending colon. The                            polyp was sessile. The polyp was removed with a                            cold snare. Resection and retrieval were complete.                           A 12 mm polyp was found in the splenic  flexure. The                            polyp was sessile. The polyp was removed with a                            cold snare. Resection and retrieval were complete.                           Two sessile polyps were found in the descending                            colon. The polyps were 4 to 8 mm in size. These                            polyps were removed with a cold snare. Resection                            and retrieval were complete.                           A 5 mm polyp was found in the sigmoid colon. The                            polyp was sessile. The polyp was removed with a                            cold snare. Resection and retrieval were complete.                           A few medium-mouthed diverticula were found in the                            sigmoid colon.                           The colon was extremely tortuous, with residual                             stool / nuts in distal transverse colon which took                            some time to clear and prolonged the exam.                           The exam was otherwise without abnormality. Complications:            No immediate complications. Estimated blood loss:                            Minimal. Estimated Blood Loss:     Estimated blood loss was minimal. Impression:               - One 7 mm polyp in the ascending colon, removed  with a cold snare. Resected and retrieved.                           - One 12 mm polyp at the splenic flexure, removed                            with a cold snare. Resected and retrieved.                           - Two 4 to 8 mm polyps in the descending colon,                            removed with a cold snare. Resected and retrieved.                           - One 5 mm polyp in the sigmoid colon, removed with                            a cold snare. Resected and retrieved.                           - Diverticulosis in the sigmoid colon.                           - Tortuous colon.                           - The examination was otherwise normal. Recommendation:           - Patient has a contact number available for                            emergencies. The signs and symptoms of potential                            delayed complications were discussed with the                            patient. Return to normal activities tomorrow.                            Written discharge instructions were provided to the                            patient.                           - Resume previous diet.                           - Continue present medications.                           - Await pathology results. Remo Lipps P. Tracen Mahler, MD 05/06/2019 12:07:27 PM This report has been signed electronically.

## 2019-05-06 NOTE — Progress Notes (Signed)
Report given to PACU, vss 

## 2019-05-07 ENCOUNTER — Other Ambulatory Visit: Payer: Medicare Other

## 2019-05-10 ENCOUNTER — Telehealth: Payer: Self-pay | Admitting: *Deleted

## 2019-05-10 NOTE — Telephone Encounter (Signed)
  Follow up Call-  Call back number 05/06/2019  Post procedure Call Back phone  # [phone]  Permission to leave phone message Yes  Some recent data might be hidden     Patient questions:  Do you have a fever, pain , or abdominal swelling? No. Pain Score  0 *  Have you tolerated food without any problems? Yes.    Have you been able to return to your normal activities? Yes.    Do you have any questions about your discharge instructions: Diet   No. Medications  No. Follow up visit  No.  Do you have questions or concerns about your Care? No.  Actions: * If pain score is 4 or above: No action needed, pain <4.   1. Have you developed a fever since your procedure? no  2.   Have you had an respiratory symptoms (SOB or cough) since your procedure? no  3.   Have you tested positive for COVID 19 since your procedure no  4.   Have you had any family members/close contacts diagnosed with the COVID 19 since your procedure?  no   If yes to any of these questions please route to Joylene John, RN and Alphonsa Gin, Therapist, sports.

## 2019-05-11 ENCOUNTER — Encounter: Payer: Self-pay | Admitting: Family Medicine

## 2019-05-11 DIAGNOSIS — Z860101 Personal history of adenomatous and serrated colon polyps: Secondary | ICD-10-CM | POA: Insufficient documentation

## 2019-05-11 DIAGNOSIS — Z86010 Personal history of colonic polyps: Secondary | ICD-10-CM | POA: Insufficient documentation

## 2019-05-21 ENCOUNTER — Other Ambulatory Visit: Payer: Self-pay

## 2019-05-21 ENCOUNTER — Inpatient Hospital Stay: Payer: Medicare Other | Attending: Internal Medicine

## 2019-05-21 ENCOUNTER — Ambulatory Visit (HOSPITAL_COMMUNITY)
Admission: RE | Admit: 2019-05-21 | Discharge: 2019-05-21 | Disposition: A | Discharge status: Home / Self Care | Payer: Medicare Other | Source: Ambulatory Visit | Attending: Internal Medicine | Admitting: Internal Medicine

## 2019-05-21 ENCOUNTER — Other Ambulatory Visit: Payer: Medicare Other

## 2019-05-21 DIAGNOSIS — C3490 Malignant neoplasm of unspecified part of unspecified bronchus or lung: Secondary | ICD-10-CM

## 2019-05-21 DIAGNOSIS — C3431 Malignant neoplasm of lower lobe, right bronchus or lung: Secondary | ICD-10-CM | POA: Diagnosis present

## 2019-05-21 DIAGNOSIS — M899 Disorder of bone, unspecified: Secondary | ICD-10-CM | POA: Diagnosis not present

## 2019-05-21 DIAGNOSIS — R079 Chest pain, unspecified: Secondary | ICD-10-CM | POA: Diagnosis not present

## 2019-05-21 LAB — CMP (CANCER CENTER ONLY)
ALT: 17 U/L (ref 0–44)
AST: 18 U/L (ref 15–41)
Albumin: 4 g/dL (ref 3.5–5.0)
Alkaline Phosphatase: 122 U/L (ref 38–126)
Anion gap: 10 (ref 5–15)
BUN: 11 mg/dL (ref 8–23)
CO2: 25 mmol/L (ref 22–32)
Calcium: 9.3 mg/dL (ref 8.9–10.3)
Chloride: 107 mmol/L (ref 98–111)
Creatinine: 0.79 mg/dL (ref 0.44–1.00)
GFR, Est AFR Am: >60 mL/min (ref >60)
GFR, Estimated: >60 mL/min (ref >60)
Glucose, Bld: 82 mg/dL (ref 70–99)
Potassium: 4.6 mmol/L (ref 3.5–5.1)
Sodium: 142 mmol/L (ref 135–145)
Total Bilirubin: 0.5 mg/dL (ref 0.3–1.2)
Total Protein: 7.3 g/dL (ref 6.5–8.1)

## 2019-05-21 LAB — CBC WITH DIFFERENTIAL (CANCER CENTER ONLY)
Abs Immature Granulocytes: 0.04 10*3/uL (ref 0.00–0.07)
Basophils Absolute: 0.1 10*3/uL (ref 0.0–0.1)
Basophils Relative: 1 %
Eosinophils Absolute: 0.4 10*3/uL (ref 0.0–0.5)
Eosinophils Relative: 4 %
HCT: 38.9 % (ref 36.0–46.0)
Hemoglobin: 12.7 g/dL (ref 12.0–15.0)
Immature Granulocytes: 0 %
Lymphocytes Relative: 20 %
Lymphs Abs: 2.3 10*3/uL (ref 0.7–4.0)
MCH: 30.2 pg (ref 26.0–34.0)
MCHC: 32.6 g/dL (ref 30.0–36.0)
MCV: 92.4 fL (ref 80.0–100.0)
Monocytes Absolute: 0.7 10*3/uL (ref 0.1–1.0)
Monocytes Relative: 6 %
Neutro Abs: 7.9 10*3/uL — ABNORMAL HIGH (ref 1.7–7.7)
Neutrophils Relative %: 69 %
Platelet Count: 359 10*3/uL (ref 150–400)
RBC: 4.21 MIL/uL (ref 3.87–5.11)
RDW: 13.2 % (ref 11.5–15.5)
WBC Count: 11.5 10*3/uL — ABNORMAL HIGH (ref 4.0–10.5)
nRBC: 0 % (ref 0.0–0.2)

## 2019-05-21 MED ORDER — IOHEXOL 300 MG/ML  SOLN
100.0000 mL | Freq: Once | INTRAMUSCULAR | Status: AC | PRN
  Administered 2019-05-21: 75 mL via INTRAVENOUS

## 2019-05-21 MED ORDER — SODIUM CHLORIDE (PF) 0.9 % IJ SOLN
INTRAMUSCULAR | Status: AC
  Filled 2019-05-21: qty 50

## 2019-05-24 ENCOUNTER — Encounter: Payer: Self-pay | Admitting: Internal Medicine

## 2019-05-24 ENCOUNTER — Other Ambulatory Visit: Payer: Self-pay

## 2019-05-24 ENCOUNTER — Inpatient Hospital Stay: Payer: Medicare Other | Admitting: Internal Medicine

## 2019-05-24 VITALS — BP 125/81 | HR 77 | Temp 98.0°F | Resp 18 | Ht 67.0 in | Wt 144.5 lb

## 2019-05-24 DIAGNOSIS — C3431 Malignant neoplasm of lower lobe, right bronchus or lung: Secondary | ICD-10-CM

## 2019-05-24 DIAGNOSIS — C3490 Malignant neoplasm of unspecified part of unspecified bronchus or lung: Secondary | ICD-10-CM

## 2019-05-24 NOTE — Progress Notes (Signed)
[facility] Telephone:([phone]) [phone]   Fax:([phone]) [phone]  OFFICE PROGRESS NOTE  [name], MD [place] [address]  DIAGNOSIS: Stage IA (T1b, N0, M0) non-small cell lung cancer, squamous cell carcinoma.  PRIOR THERAPY:  Status post right lower lobectomy with lymph node dissection under the care of Dr. Servando Snare on September 16, 2017.  CURRENT THERAPY: Observation.  INTERVAL HISTORY: Margaret Charles 68 y.o. female returns to the clinic today for follow-up visit.  The patient is feeling fine today with no concerning complaints except for the persistent pain on the right side of the chest.  She denied having any shortness of breath, cough or hemoptysis.  She denied having any fever or chills.  She has no nausea, vomiting, diarrhea or constipation.  She denied having any recent weight loss or night sweats.  She is here today for evaluation with repeat CT scan of the chest for restaging of her disease.  MEDICAL HISTORY: Past Medical History:  Diagnosis Date  . Arthritis    ddd  . Chronic headaches    imprved recently per patient   . Emphysema of lung ([facility])    asymptomatic. noted on CT  . Family history of colon cancer   . History of adenomatous polyp of colon    2015 precancerous polyp history- due spring 2020.  5 years  . History of seizures    1980s few ??? seizure activity  (vagal response)  . Hypertension   . Lung cancer ([facility]) dx'd 07/2017  . Palpitations   . RBBB (right bundle branch block)    palpitations  . Skin cancer    skin basal cell cancer    ALLERGIES:  is allergic to codeine and quinolones.  MEDICATIONS:  Current Outpatient Medications  Medication Sig Dispense Refill  . acetaminophen (TYLENOL) 325 MG tablet Take 650 mg by mouth every 6 (six) hours as needed for mild pain.    . fluticasone (FLONASE) 50 MCG/ACT nasal spray Place 1 spray into both nostrils daily.    Marland Kitchen levothyroxine (SYNTHROID) 75 MCG tablet Take 1 tablet  (75 mcg total) by mouth daily. 90 tablet 3  . metoprolol tartrate (LOPRESSOR) 25 MG tablet Take 1 tablet (25 mg total) by mouth 2 (two) times daily. Please make yearly appt with Dr. Acie Fredrickson for December for future refills. 1st attempt 180 tablet 0  . Multiple Vitamins-Minerals (CENTRUM ADULTS PO) Take 1 tablet by mouth daily.    Marland Kitchen Propylene Glycol-Glycerin (SOOTHE OP) Place 1 drop into both eyes 2 (two) times daily as needed (dry eyes).     Current Facility-Administered Medications  Medication Dose Route Frequency Provider Last Rate Last Dose  . 0.9 %  sodium chloride infusion  500 mL Intravenous Once Armbruster, Carlota Raspberry, MD        SURGICAL HISTORY:  Past Surgical History:  Procedure Laterality Date  . BLADDER SURGERY  1985   Long term incontinence. [facility]- used small intestine to build bladder  . BREAST BIOPSY  2001  . CATARACT EXTRACTION W/ INTRAOCULAR LENS  IMPLANT, BILATERAL    . Intestinal blockage  1989 and 1990   bowel obstruction surgery  . Lung cancer removal   08/2017   Right lower lobe  . VIDEO ASSISTED THORACOSCOPY (VATS)/WEDGE RESECTION Right 09/16/2017   Procedure: VIDEO ASSISTED THORACOSCOPY (VATS)/LUNG RESECTION;  Surgeon: [name], MD;  Location: [facility];  Service: Thoracic;  Laterality: Right;  Marland Kitchen VIDEO BRONCHOSCOPY N/A 09/16/2017   Procedure:  VIDEO BRONCHOSCOPY;  Surgeon: [name], MD;  Location: [facility];  Service: Thoracic;  Laterality: N/A;  . WRIST SURGERY     de quervains tenosynovitis. Dr. Maureen Ralphs actually did this.     REVIEW OF SYSTEMS:  Constitutional: positive for fatigue Eyes: negative Ears, nose, mouth, throat, and face: negative Respiratory: positive for pleurisy/chest pain Cardiovascular: negative Gastrointestinal: negative Genitourinary:negative Integument/breast: negative Hematologic/lymphatic: negative Musculoskeletal:negative Neurological: negative Behavioral/Psych: negative Endocrine: negative Allergic/Immunologic: negative    PHYSICAL EXAMINATION: General appearance: alert, cooperative and no distress Head: Normocephalic, without obvious abnormality, atraumatic Neck: no adenopathy, no JVD, supple, symmetrical, trachea midline and thyroid not enlarged, symmetric, no tenderness/mass/nodules Lymph nodes: Cervical, supraclavicular, and axillary nodes normal. Resp: clear to auscultation bilaterally Back: symmetric, no curvature. ROM normal. No CVA tenderness. Cardio: regular rate and rhythm, S1, S2 normal, no murmur, click, rub or gallop GI: soft, non-tender; bowel sounds normal; no masses,  no organomegaly Extremities: extremities normal, atraumatic, no cyanosis or edema Neurologic: Alert and oriented X 3, normal strength and tone. Normal symmetric reflexes. Normal coordination and gait  ECOG PERFORMANCE STATUS: 1 - Symptomatic but completely ambulatory  Blood pressure 125/81, pulse 77, temperature 98 F (36.7 C), temperature source Temporal, resp. rate 18, height 5\' 7"  (1.702 m), weight 144 lb 8 oz (65.5 kg), SpO2 100 %.  LABORATORY DATA: Lab Results  Component Value Date   WBC 11.5 (H) 05/21/2019   HGB 12.7 05/21/2019   HCT 38.9 05/21/2019   MCV 92.4 05/21/2019   PLT 359 05/21/2019      Chemistry      Component Value Date/Time   NA 142 05/21/2019 1029   K 4.6 05/21/2019 1029   CL 107 05/21/2019 1029   CO2 25 05/21/2019 1029   BUN 11 05/21/2019 1029   CREATININE 0.79 05/21/2019 1029      Component Value Date/Time   CALCIUM 9.3 05/21/2019 1029   ALKPHOS 122 05/21/2019 1029   AST 18 05/21/2019 1029   ALT 17 05/21/2019 1029   BILITOT 0.5 05/21/2019 1029       RADIOGRAPHIC STUDIES: Ct Chest W Contrast  Result Date: 05/21/2019 CLINICAL DATA:  Patient with history of lung cancer status post right lower. Follow-up exam. EXAM: CT CHEST WITH CONTRAST TECHNIQUE: Multidetector CT imaging of the chest was performed during intravenous contrast administration. CONTRAST:  8mL OMNIPAQUE IOHEXOL 300 MG/ML   SOLN COMPARISON:  CT chest 11/17/2018 FINDINGS: Cardiovascular: Normal heart size. Ascending thoracic aorta measures 3.9 cm (image 75; series 2). Thoracic aortic vascular calcifications. Trace fluid superior pericardial recess. Mediastinum/Nodes: Stable prominent subcentimeter mediastinal lymph nodes. No enlarged axillary, mediastinal or hilar lymphadenopathy. Lungs/Pleura: Postsurgical changes compatible with right lower lobectomy, stable. Central airways are patent. Minimal atelectasis within the left lower lobe. Centrilobular and paraseptal emphysematous changes. Stable small partially loculated effusion within the medial right lower hemithorax. Upper Abdomen: No acute process. Musculoskeletal: Thoracic spine degenerative changes. Within the left aspect of the T3 vertebral body (image 100; series 7) there is a new 9 mm focal sclerotic lesion. IMPRESSION: 1. New 9 mm sclerotic lesion within the left aspect of the T3 vertebral body, nonspecific however raising the possibility of osseous metastatic disease. Recommend attention on follow-up. 2. Stable postsurgical changes compatible with right lower lobectomy. 3. Emphysema and aortic atherosclerosis. 4. Ascending thoracic aorta measures 3.9 cm. Recommend attention on follow-up. Aortic Atherosclerosis (ICD10-I70.0) and Emphysema (ICD10-J43.9). Electronically Signed   By: Lovey Newcomer M.D.   On: 05/21/2019 12:55    ASSESSMENT AND PLAN: This  is a very pleasant 68 years old white female with a stage Ia non-small cell lung cancer status post right lower lobectomy with lymph node dissection in March 2019 under the care of Dr. Servando Snare.   The patient is doing fine with no concerning complaints except for the intermittent pain on the right side of the chest at the surgical scar. She had repeat CT scan of the chest performed recently.  I personally and independently reviewed the scan images and discussed the results with the patient today. Her scan showed no concerning  findings for disease recurrence or progression but there was new 0.9 cm sclerotic lesion within the left aspect of the T3 vertebral body concerning for osseous metastasis. I had a lengthy discussion with the patient today about her condition and further investigation to rule out disease recurrence.  I gave her the option of proceeding with a PET scan in the next 2 weeks versus continuous observation and monitoring with repeat CT scan of the chest in 3 months. The patient is very anxious and she would like to have the PET scan done as soon as possible.  We will arrange for this to be done in the next 1-2 weeks and the patient will come back for follow-up visit after the PET scan for further evaluation and discussion of her condition. She was advised to call immediately if she has any other concerning symptoms in the interval. The patient voices understanding of current disease status and treatment options and is in agreement with the current care plan.  All questions were answered. The patient knows to call the clinic with any problems, questions or concerns. We can certainly see the patient much sooner if necessary.   Disclaimer: This note was dictated with voice recognition software. Similar sounding words can inadvertently be transcribed and may not be corrected upon review.

## 2019-05-25 ENCOUNTER — Telehealth: Payer: Self-pay | Admitting: Internal Medicine

## 2019-05-25 NOTE — Telephone Encounter (Signed)
Scheduled per los. Called and spoke with patient. Confirmed appt 

## 2019-06-03 ENCOUNTER — Other Ambulatory Visit: Payer: Self-pay

## 2019-06-03 ENCOUNTER — Encounter (HOSPITAL_COMMUNITY)
Admission: RE | Admit: 2019-06-03 | Discharge: 2019-06-03 | Disposition: A | Discharge status: Home / Self Care | Payer: Medicare Other | Source: Ambulatory Visit | Attending: Internal Medicine | Admitting: Internal Medicine

## 2019-06-03 DIAGNOSIS — Z902 Acquired absence of lung [part of]: Secondary | ICD-10-CM | POA: Diagnosis not present

## 2019-06-03 DIAGNOSIS — C3490 Malignant neoplasm of unspecified part of unspecified bronchus or lung: Secondary | ICD-10-CM | POA: Insufficient documentation

## 2019-06-03 DIAGNOSIS — J90 Pleural effusion, not elsewhere classified: Secondary | ICD-10-CM | POA: Diagnosis not present

## 2019-06-03 DIAGNOSIS — I2510 Atherosclerotic heart disease of native coronary artery without angina pectoris: Secondary | ICD-10-CM | POA: Insufficient documentation

## 2019-06-03 DIAGNOSIS — I700 Atherosclerosis of aorta: Secondary | ICD-10-CM | POA: Insufficient documentation

## 2019-06-03 LAB — GLUCOSE, CAPILLARY: Glucose-Capillary: 91 mg/dL (ref 70–99)

## 2019-06-03 MED ORDER — FLUDEOXYGLUCOSE F - 18 (FDG) INJECTION
7.1900 | Freq: Once | INTRAVENOUS | Status: AC
  Administered 2019-06-03: 11:00:00 7.19 via INTRAVENOUS

## 2019-06-07 ENCOUNTER — Other Ambulatory Visit: Payer: Self-pay

## 2019-06-07 ENCOUNTER — Encounter: Payer: Self-pay | Admitting: Physician Assistant

## 2019-06-07 ENCOUNTER — Inpatient Hospital Stay: Payer: Medicare Other | Attending: Internal Medicine | Admitting: Physician Assistant

## 2019-06-07 VITALS — BP 129/81 | HR 74 | Temp 98.0°F | Resp 18 | Ht 67.0 in | Wt 144.2 lb

## 2019-06-07 DIAGNOSIS — I712 Thoracic aortic aneurysm, without rupture: Secondary | ICD-10-CM | POA: Diagnosis not present

## 2019-06-07 DIAGNOSIS — E041 Nontoxic single thyroid nodule: Secondary | ICD-10-CM | POA: Diagnosis not present

## 2019-06-07 DIAGNOSIS — C3431 Malignant neoplasm of lower lobe, right bronchus or lung: Secondary | ICD-10-CM | POA: Diagnosis present

## 2019-06-07 NOTE — Progress Notes (Signed)
[facility] OFFICE PROGRESS NOTE  [name], MD [address] [place] [address]  DIAGNOSIS: Stage IA(T1b, N0, M0) non-small cell lung cancer, squamous cell carcinoma.  PRIOR THERAPY: Status post right lower lobectomy with lymph node dissection under the care of Dr. Servando Snare on September 16, 2017.  CURRENT THERAPY: Observation   INTERVAL HISTORY: Margaret Charles 68 y.o. female returns to the clinic today for a follow-up visit.  The patient has a history of stage Ia non-small cell lung cancer, status post right lower lobectomy in March 2019.  The patient had been on observation since that time and had been feeling well except for some persistent sharp/pressure pain over her right anterolateral rib cage, which has been persistent since having her lobectomy. She states that the pain lasts approximately 30 to 40 seconds before it resolves spontaneously.  Her pain is often exacerbated by movement.  She has a follow-up appointment with Dr. Servando Snare in February 2021.  She recently had a routine CT scan performed on 05/21/2019 which noted a new 9 mm sclerotic lesion on T3.  The patient opted to have a PET scan to further assess this lesion to rule out any disease recurrence.  Today, the patient is otherwise feeling well and denies any recent fever, chills, night sweats, or weight loss.  She denies any cough, hemoptysis, or shortness of breath.  She denies any nausea, vomiting, diarrhea, or constipation.  She denies any unusual headaches or visual changes.  The patient also notes that she has thyroid nodules for which she periodically undergoes ultrasounds and is being followed closely by an endocrinologist. She also follows closely with Cardiology for her ascending thoracic aorta aneurysm and has an appointment later this week.  She is here today for evaluation and to review the results of her PET scan.  MEDICAL HISTORY: Past Medical History:  Diagnosis Date  . Arthritis     ddd  . Chronic headaches    imprved recently per patient   . Emphysema of lung ([place])    asymptomatic. noted on CT  . Family history of colon cancer   . History of adenomatous polyp of colon    2015 precancerous polyp history- due spring 2020.  5 years  . History of seizures    1980s few ??? seizure activity  (vagal response)  . Hypertension   . Lung cancer ([place]) dx'd 07/2017  . Palpitations   . RBBB (right bundle branch block)    palpitations  . Skin cancer    skin basal cell cancer    ALLERGIES:  is allergic to codeine and quinolones.  MEDICATIONS:  Current Outpatient Medications  Medication Sig Dispense Refill  . acetaminophen (TYLENOL) 325 MG tablet Take 650 mg by mouth every 6 (six) hours as needed for mild pain.    . fluticasone (FLONASE) 50 MCG/ACT nasal spray Place 1 spray into both nostrils daily.    Marland Kitchen levothyroxine (SYNTHROID) 75 MCG tablet Take 1 tablet (75 mcg total) by mouth daily. 90 tablet 3  . metoprolol tartrate (LOPRESSOR) 25 MG tablet Take 1 tablet (25 mg total) by mouth 2 (two) times daily. Please make yearly appt with Dr. Acie Fredrickson for December for future refills. 1st attempt 180 tablet 0  . Multiple Vitamins-Minerals (CENTRUM ADULTS PO) Take 1 tablet by mouth daily.    Marland Kitchen Propylene Glycol-Glycerin (SOOTHE OP) Place 1 drop into both eyes 2 (two) times daily as needed (dry eyes).     No current facility-administered medications for  this visit.    SURGICAL HISTORY:  Past Surgical History:  Procedure Laterality Date  . BLADDER SURGERY  1985   Long term incontinence. [facility]- used small intestine to build bladder  . BREAST BIOPSY  2001  . CATARACT EXTRACTION W/ INTRAOCULAR LENS  IMPLANT, BILATERAL    . Intestinal blockage  1989 and 1990   bowel obstruction surgery  . Lung cancer removal   08/2017   Right lower lobe  . VIDEO ASSISTED THORACOSCOPY (VATS)/WEDGE RESECTION Right 09/16/2017   Procedure: VIDEO ASSISTED THORACOSCOPY (VATS)/LUNG RESECTION;  Surgeon:  [name], MD;  Location: [facility];  Service: Thoracic;  Laterality: Right;  Marland Kitchen VIDEO BRONCHOSCOPY N/A 09/16/2017   Procedure: VIDEO BRONCHOSCOPY;  Surgeon: [name], MD;  Location: [facility] OR;  Service: Thoracic;  Laterality: N/A;  . WRIST SURGERY     de quervains tenosynovitis. Dr. Maureen Ralphs actually did this.     REVIEW OF SYSTEMS:   Review of Systems  Constitutional: Negative for appetite change, chills, fatigue, fever and unexpected weight change.  HENT:   Negative for mouth sores, nosebleeds, sore throat and trouble swallowing.   Eyes: Negative for eye problems and icterus.  Respiratory: Negative for cough, hemoptysis, shortness of breath and wheezing. Cardiovascular: Positive for lower right rib pain. Negative for leg swelling.  Gastrointestinal: Negative for abdominal pain, constipation, diarrhea, nausea and vomiting.  Genitourinary: Negative for bladder incontinence, difficulty urinating, dysuria, frequency and hematuria.   Musculoskeletal: Negative for back pain, gait problem, neck pain and neck stiffness.  Skin: Negative for itching and rash.  Neurological: Negative for dizziness, extremity weakness, gait problem, headaches, light-headedness and seizures.  Hematological: Negative for adenopathy. Does not bruise/bleed easily.  Psychiatric/Behavioral: Negative for confusion, depression and sleep disturbance. The patient is not nervous/anxious.     PHYSICAL EXAMINATION:  Blood pressure 129/81, pulse 74, temperature 98 F (36.7 C), temperature source Temporal, resp. rate 18, height 5\' 7"  (1.702 m), weight 144 lb 3.2 oz (65.4 kg), SpO2 100 %.  ECOG PERFORMANCE STATUS: 0 - Asymptomatic  Physical Exam  Constitutional: Oriented to person, place, and time and well-developed, well-nourished, and in no distress.  HENT:  Head: Normocephalic and atraumatic.  Mouth/Throat: Oropharynx is clear and moist. No oropharyngeal exudate.  Eyes: Conjunctivae are normal. Right eye exhibits  no discharge. Left eye exhibits no discharge. No scleral icterus.  Neck: Normal range of motion. Neck supple.  Cardiovascular: Normal rate, regular rhythm, normal heart sounds and intact distal pulses.   Pulmonary/Chest: Effort normal and breath sounds normal. No respiratory distress. No wheezes. No rales.  Abdominal: Soft. Bowel sounds are normal. Exhibits no distension and no mass. There is no tenderness.  Musculoskeletal: Tenderness to palpation over the lower right rib cage. Normal range of motion. Exhibits no edema.  Lymphadenopathy:    No cervical adenopathy.  Neurological: Alert and oriented to person, place, and time. Exhibits normal muscle tone. Gait normal. Coordination normal.  Skin: Skin is warm and dry. No rash noted. Not diaphoretic. No erythema. No pallor.  Psychiatric: Mood, memory and judgment normal.  Vitals reviewed.  LABORATORY DATA: Lab Results  Component Value Date   WBC 11.5 (H) 05/21/2019   HGB 12.7 05/21/2019   HCT 38.9 05/21/2019   MCV 92.4 05/21/2019   PLT 359 05/21/2019      Chemistry      Component Value Date/Time   NA 142 05/21/2019 1029   K 4.6 05/21/2019 1029   CL 107 05/21/2019 1029   CO2 25 05/21/2019 1029  BUN 11 05/21/2019 1029   CREATININE 0.79 05/21/2019 1029      Component Value Date/Time   CALCIUM 9.3 05/21/2019 1029   ALKPHOS 122 05/21/2019 1029   AST 18 05/21/2019 1029   ALT 17 05/21/2019 1029   BILITOT 0.5 05/21/2019 1029       RADIOGRAPHIC STUDIES:  CT Chest W Contrast  Result Date: 05/21/2019 CLINICAL DATA:  Patient with history of lung cancer status post right lower. Follow-up exam. EXAM: CT CHEST WITH CONTRAST TECHNIQUE: Multidetector CT imaging of the chest was performed during intravenous contrast administration. CONTRAST:  76mL OMNIPAQUE IOHEXOL 300 MG/ML  SOLN COMPARISON:  CT chest 11/17/2018 FINDINGS: Cardiovascular: Normal heart size. Ascending thoracic aorta measures 3.9 cm (image 75; series 2). Thoracic aortic  vascular calcifications. Trace fluid superior pericardial recess. Mediastinum/Nodes: Stable prominent subcentimeter mediastinal lymph nodes. No enlarged axillary, mediastinal or hilar lymphadenopathy. Lungs/Pleura: Postsurgical changes compatible with right lower lobectomy, stable. Central airways are patent. Minimal atelectasis within the left lower lobe. Centrilobular and paraseptal emphysematous changes. Stable small partially loculated effusion within the medial right lower hemithorax. Upper Abdomen: No acute process. Musculoskeletal: Thoracic spine degenerative changes. Within the left aspect of the T3 vertebral body (image 100; series 7) there is a new 9 mm focal sclerotic lesion. IMPRESSION: 1. New 9 mm sclerotic lesion within the left aspect of the T3 vertebral body, nonspecific however raising the possibility of osseous metastatic disease. Recommend attention on follow-up. 2. Stable postsurgical changes compatible with right lower lobectomy. 3. Emphysema and aortic atherosclerosis. 4. Ascending thoracic aorta measures 3.9 cm. Recommend attention on follow-up. Aortic Atherosclerosis (ICD10-I70.0) and Emphysema (ICD10-J43.9). Electronically Signed   By: Lovey Newcomer M.D.   On: 05/21/2019 12:55   NM PET Image Restag (PS) Skull Base To Thigh  Result Date: 06/03/2019 CLINICAL DATA:  Subsequent treatment strategy for non-small cell lung cancer. EXAM: NUCLEAR MEDICINE PET SKULL BASE TO THIGH TECHNIQUE: 7.1 mCi F-18 FDG was injected intravenously. Full-ring PET imaging was performed from the skull base to thigh after the radiotracer. CT data was obtained and used for attenuation correction and anatomic localization. Fasting blood glucose: 91 mg/dl COMPARISON:  Multiple exams, including CT chest from 05/21/2019 and PET-CT from 08/25/2017 FINDINGS: Mediastinal blood pool activity: SUV max 2.7 Liver activity: SUV max 3.3 NECK: Symmetric palatine tonsillar activity appears physiologic. Mild physiologic glottic  activity. Bilateral thyroid activity, maximum SUV of the left lobe 6.6 and of the right lobe 5.6, potentially from thyroiditis or small underlying thyroid nodules. Previously the left thyroid lobe had a maximum SUV of 6.5. Incidental CT findings: none CHEST: No significant abnormal hypermetabolic activity in this region. Incidental CT findings: Right lower lobectomy. Coronary, aortic arch, and branch vessel atherosclerotic vascular disease. Small right pleural effusion. ABDOMEN/PELVIS: Physiologic activity in bowel. Incidental CT findings: Postoperative findings along the dilated loop of bowel in the left abdomen. Aortoiliac atherosclerotic vascular disease. SKELETON: In the vicinity of the previously identified hazy sclerosis posteriorly at the T3 vertebral body level, no accentuated metabolic activity is present. No findings of osseous hypermetabolic activity elsewhere in the skeleton. Incidental CT findings: none IMPRESSION: 1. No evidence of hypermetabolic/active malignancy at this time. 2. Bilateral thyroid activity, potentially from thyroiditis or small underlying thyroid nodules. On previous ultrasound workup these nodules do not meet criteria for biopsy. 3. Other imaging findings of potential clinical significance: Right lower lobectomy with small right pleural effusion. 4.  Aortic Atherosclerosis (ICD10-I70.0).  Coronary atherosclerosis. Electronically Signed   By: Cindra Eves.D.  On: 06/03/2019 13:54     ASSESSMENT/PLAN:  This is a very pleasant 68 year old Caucasian female diagnosed with stage Ia non-small cell lung cancer, squamous cell carcinoma of the right lower lobe.  She is status post a right lower lobectomy with lymph node dissection under the care of Dr. Servando Snare on September 16, 2017.  She has been on observation since that time.  The patient had a repeat CT scan of the chest on 05-21-2019 which showed a new 9 mm sclerotic lesion on T3.  The patient had a PET scan performed to  further evaluate this and to rule out disease recurrence.  Dr. Julien Nordmann personally and independently reviewed the scan and discussed the results with the patient today.  The scan did not show any evidence of disease recurrence.  Dr. Julien Nordmann recommends the patient continue on observation with a repeat CT scan of the chest in 6 months.  We will see the patient back for follow-up visit in 6 months for evaluation and to review the results of her restaging CT scan.  The patient was advised to call immediately if she has any concerning symptoms in the interval. The patient voices understanding of current disease status and treatment options and is in agreement with the current care plan. All questions were answered. The patient knows to call the clinic with any problems, questions or concerns. We can certainly see the patient much sooner if necessary   Orders Placed This Encounter  Procedures  . CT Chest W Contrast    Standing Status:   Future    Standing Expiration Date:   06/06/2020    Order Specific Question:   ** REASON FOR EXAM (FREE TEXT)    Answer:   Restaging Lung Cancer    Order Specific Question:   If indicated for the ordered procedure, I authorize the administration of contrast media per Radiology protocol    Answer:   Yes    Order Specific Question:   Preferred imaging location?    Answer:   [facility]    Order Specific Question:   Radiology Contrast Protocol - do NOT remove file path    Answer:   [email or web address]  . CBC with Differential ([facility] Only)    Standing Status:   Future    Standing Expiration Date:   06/06/2020  . CMP ([place] only)    Standing Status:   Future    Standing Expiration Date:   06/06/2020     Margaret Sos Jozette Castrellon, PA-C 06/07/19  ADDENDUM: Hematology/Oncology Attending: I had a face-to-face encounter with the patient today.  I recommended her care plan.  This is a very pleasant 67 years old  white female with history of stage Ia non-small cell lung cancer, squamous cell carcinoma involving the right lower lobe status post right lower lobectomy with lymph node dissection in 2019 and has been in observation since that time. Her most recent CT scan of the chest showed suspicious sclerotic lesion at T3. I ordered a PET scan which was performed recently and the patient is here today for evaluation and discussion of her PET scan results. Her PET scan showed no concerning findings for bone metastasis or other disease recurrence or metastasis. I recommended for the patient to continue on observation with repeat CT scan of the chest in 6 months. She was advised to call immediately if she has any concerning symptoms in the interval.  Disclaimer: This note was dictated with voice recognition software. Similar sounding words can inadvertently  be transcribed and may be missed upon review. Eilleen Kempf, MD 06/07/19

## 2019-06-10 ENCOUNTER — Encounter: Payer: Self-pay | Admitting: Cardiovascular Disease

## 2019-06-10 ENCOUNTER — Ambulatory Visit: Payer: Medicare Other | Admitting: Cardiovascular Disease

## 2019-06-10 ENCOUNTER — Other Ambulatory Visit: Payer: Self-pay

## 2019-06-10 VITALS — BP 120/70 | HR 66 | Ht 67.0 in | Wt 142.8 lb

## 2019-06-10 DIAGNOSIS — I4510 Unspecified right bundle-branch block: Secondary | ICD-10-CM | POA: Diagnosis not present

## 2019-06-10 NOTE — Patient Instructions (Signed)
Medication Instructions:  Your provider recommends that you continue on your current medications as directed. Please refer to the Current Medication list given to you today.   *If you need a refill on your cardiac medications before your next appointment, please call your pharmacy*   Follow-Up: At [facility], you and your health needs are our priority.  As part of our continuing mission to provide you with exceptional heart care, we have created designated Provider Care Teams.  These Care Teams include your primary Cardiologist (physician) and Advanced Practice Providers (APPs -  Physician Assistants and Nurse Practitioners) who all work together to provide you with the care you need, when you need it. Your next appointment:   12 month(s) The format for your next appointment:   In Person Provider:   Richardson Dopp, PA-C

## 2019-06-10 NOTE — Progress Notes (Signed)
Cardiology Office Note:    Date:  06/10/2019   ID:  Margaret Charles, DOB 1950-11-25, MRN [id number]  PCP:  [name], MD  Cardiologist:  Deondrea Aguado   Referring MD: [name], MD   Problem list 1. Paroxysmal atrial fib - found after partial pneumonectomy 2.  Lung nodule 3.  Right bundle branch block   Chief Complaint  Patient presents with  . Atrial Fibrillation  . Hypertension       Margaret Charles is a 68 y.o. female with a hx of 1.4 cm lung nodule.  She has a history of right bundle branch block and palpitations.  We are asked to see her for preoperative clearance prior to lung surgery.  She has a RBBB - which she has known about for the past 10 years . She has seen Renaldo in [place]. ( [facility])  Echo at that time showed normal LV function  Was told that she was stable  Developed palpitations a year or so later.   30 day monitr did not show any issues  She had a low dose lung CT scan for screening ( since she was a smoker )  Was found to have a 1.4 cm mass.    deines any chest pain or dyspnea.   Walks 3 miles a day a a good pace .  Able to climb 3 flights of stairs without any problems  Does water aerobics.  BP has been elevated . She eats lots of salt - popcorn every day .  Hx of obesity in her 3s .  Has lost 90 lbs   December 08, 2017  Margaret Charles is seen back today after her Rt lower lobe lobectomy ( for stage 1 squamous cell carcinoma)  She apparently developed atrial fibrillation while in the hospital.  Her blood pressure was also not well controlled.  She was started on amiodarone 200 mg twice a day.  Which was gradually titrated down.  She is now on 200 mg a day and metoprolol   June 10, 2018: Here for follow up  Had PAF after a lobectomy   June 10, 2019: Margaret Charles is seen today for follow-up of her hypertension and paroxysmal atrial fibrillation.  She also has a history of hypothyroidism and is on Synthroid.  Has hx of lung cancer.    Has  also been found to have an aneurism of her ascending aorta.  Echo showed normal LV function .   Could not differentiate whether or not the  Aortic valve was 2 or 3 leaflet. The valve functions as a 3 leaflet valve so it is likely a normal 3 leaflet valve.       Past Medical History:  Diagnosis Date  . Arthritis    ddd  . Chronic headaches    imprved recently per patient   . Emphysema of lung ([place])    asymptomatic. noted on CT  . Family history of colon cancer   . History of adenomatous polyp of colon    2015 precancerous polyp history- due spring 2020.  5 years  . History of seizures    1980s few ??? seizure activity  (vagal response)  . Hypertension   . Lung cancer ([place]) dx'd 07/2017  . Palpitations   . RBBB (right bundle branch block)    palpitations  . Skin cancer    skin basal cell cancer    Past Surgical History:  Procedure Laterality Date  . BLADDER SURGERY  1985   Long term incontinence.  [facility]- used small intestine to build bladder  . BREAST BIOPSY  2001  . CATARACT EXTRACTION W/ INTRAOCULAR LENS  IMPLANT, BILATERAL    . Intestinal blockage  1989 and 1990   bowel obstruction surgery  . Lung cancer removal   08/2017   Right lower lobe  . VIDEO ASSISTED THORACOSCOPY (VATS)/WEDGE RESECTION Right 09/16/2017   Procedure: VIDEO ASSISTED THORACOSCOPY (VATS)/LUNG RESECTION;  Surgeon: [name], MD;  Location: [facility];  Service: Thoracic;  Laterality: Right;  Marland Kitchen VIDEO BRONCHOSCOPY N/A 09/16/2017   Procedure: VIDEO BRONCHOSCOPY;  Surgeon: [name], MD;  Location: [facility] OR;  Service: Thoracic;  Laterality: N/A;  . WRIST SURGERY     de quervains tenosynovitis. Dr. Maureen Ralphs actually did this.     Current Medications: Current Meds  Medication Sig  . acetaminophen (TYLENOL) 325 MG tablet Take 650 mg by mouth every 6 (six) hours as needed for mild pain.  . fluticasone (FLONASE) 50 MCG/ACT nasal spray Place 1 spray into both nostrils daily.  Marland Kitchen levothyroxine  (SYNTHROID) 75 MCG tablet Take 1 tablet (75 mcg total) by mouth daily.  . metoprolol tartrate (LOPRESSOR) 25 MG tablet Take 1 tablet (25 mg total) by mouth 2 (two) times daily. Please make yearly appt with Dr. Acie Fredrickson for December for future refills. 1st attempt  . Multiple Vitamins-Minerals (CENTRUM ADULTS PO) Take 1 tablet by mouth daily.  Marland Kitchen Propylene Glycol-Glycerin (SOOTHE OP) Place 1 drop into both eyes 2 (two) times daily as needed (dry eyes).     Allergies:   Codeine and Quinolones   Social History   Socioeconomic History  . Marital status: Single    Spouse name: Not on file  . Number of children: Not on file  . Years of education: Not on file  . Highest education level: Not on file  Occupational History  . Occupation: Retired Pharmacist, hospital  Tobacco Use  . Smoking status: Former Smoker    Packs/day: 1.00    Years: 40.00    Pack years: 40.00    Types: Cigarettes    Quit date: 12/03/2008    Years since quitting: 10.5  . Smokeless tobacco: Never Used  Substance and Sexual Activity  . Alcohol use: Not Currently    Comment: hx  etoh  37 yrs  . Drug use: Never  . Sexual activity: Not Currently  Other Topics Concern  . Not on file  Social History Narrative   Single. Lives alone.    Lived in [place] for ome years to help with parents but otherwise in [place] most of life.       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 Difficulty of Paying Living Expenses: Not on file  Food Insecurity:   . Worried About Charity fundraiser in the Last Year: Not on file  . Ran Out of Food in the Last Year: Not on file  Transportation Needs:   . Lack of Transportation (Medical): Not on file  . Lack of Transportation (Non-Medical): Not on file  Physical Activity:   . Days of  Exercise per Week: Not on file  . Minutes of Exercise per Session: Not on file  Stress:   . Feeling of Stress : Not on file  Social Connections:   .  Frequency of Communication with Friends and Family: Not on file  . Frequency of Social Gatherings with Friends and Family: Not on file  . Attends Religious Services: Not on file  . Active Member of Clubs or Organizations: Not on file  . Attends Archivist Meetings: Not on file  . Marital Status: Not on file     Family History: The patient's family history includes Breast cancer in her sister; Colon cancer in an other family member; Colon polyps in an other family member; Dementia in her mother; Heart attack in her mother, paternal grandfather, and sister; Hypertension in her father, mother, sister, and sister; Prostate cancer in her father.  ROS:   Please see the history of present illness.     All other systems reviewed and are negative.  EKGs/Labs/Other Studies Reviewed:    The following studies were reviewed today:      Recent Labs: 05/05/2019: TSH 4.02 05/21/2019: ALT 17; BUN 11; Creatinine 0.79; Hemoglobin 12.7; Platelet Count 359; Potassium 4.6; Sodium 142  Recent Lipid Panel    Component Value Date/Time   CHOL 184 03/26/2019 0851   TRIG 84.0 03/26/2019 0851   HDL 62.90 03/26/2019 0851   CHOLHDL 3 03/26/2019 0851   VLDL 16.8 03/26/2019 0851   LDLCALC 104 (H) 03/26/2019 0851    Physical Exam: Blood pressure 120/70, Charles 66, height 5\' 7"  (1.702 m), weight 142 lb 12.8 oz (64.8 kg), SpO2 99 %.  GEN:  Well nourished, well developed in no acute distress HEENT: Normal NECK: No JVD; No carotid bruits LYMPHATICS: No lymphadenopathy CARDIAC: RRR , no murmurs, rubs, gallops RESPIRATORY:  Clear to auscultation without rales, wheezing or rhonchi  ABDOMEN: Soft, non-tender, non-distended MUSCULOSKELETAL:  No edema; No deformity  SKIN: Warm and dry NEUROLOGIC:  Alert and oriented x 3  EKG:    Dec. 17, 2020:  NSR  at 65. RBBB .   ASSESSMENT:  Plan      1.  Paroxysmal atrial fibrillation:  She had an episode of paroxysmal atrial fibrillation following her partial pneumonectomy.  It has not recurred.  Still normal sinus rhythm.  Continue current medications.  She is not currently on anticoagulation.  2.  Essential hypertension:    Blood pressure remains well controlled.  Continue current medications.  Medication Adjustments/Labs and Tests Ordered: Current medicines are reviewed at length with the patient today.  Concerns regarding medicines are outlined above.  Orders Placed This Encounter  Procedures  . EKG 12-Lead   No orders of the defined types were placed in this encounter.   Signed, Mertie Moores, MD  06/10/2019 10:09 AM    [place]

## 2019-06-12 ENCOUNTER — Other Ambulatory Visit: Payer: Self-pay | Admitting: Cardiovascular Disease

## 2019-07-16 ENCOUNTER — Telehealth: Payer: Self-pay | Admitting: Family Medicine

## 2019-07-16 NOTE — Telephone Encounter (Signed)
I called the pt to schedule AWV-I with Loma Sousa.  She said that she will call back next week.

## 2019-07-26 ENCOUNTER — Ambulatory Visit: Payer: Medicare Other

## 2019-08-05 ENCOUNTER — Encounter: Payer: Medicare Other | Admitting: Cardiothoracic Surgery

## 2019-08-16 LAB — HM MAMMOGRAPHY

## 2019-08-19 ENCOUNTER — Encounter: Payer: Self-pay | Admitting: Cardiothoracic Surgery

## 2019-08-19 ENCOUNTER — Other Ambulatory Visit: Payer: Self-pay

## 2019-08-19 ENCOUNTER — Ambulatory Visit: Payer: Medicare PPO | Admitting: Cardiothoracic Surgery

## 2019-08-19 VITALS — BP 130/80 | HR 62 | Temp 97.7°F | Resp 20 | Ht 67.0 in | Wt 142.0 lb

## 2019-08-19 DIAGNOSIS — Z85118 Personal history of other malignant neoplasm of bronchus and lung: Secondary | ICD-10-CM

## 2019-08-19 DIAGNOSIS — I7121 Aneurysm of the ascending aorta, without rupture: Secondary | ICD-10-CM

## 2019-08-19 DIAGNOSIS — I712 Thoracic aortic aneurysm, without rupture: Secondary | ICD-10-CM

## 2019-08-19 NOTE — Progress Notes (Signed)
[address]       [place] [address]             [phone]                  Margaret Charles [place] Medical Record #[id number] Date of Birth: March 04, 1951  Referring RX:[id number], Brayton Mars, MD Primary Cardiology: Primary Care:Hunter, Brayton Mars, MD  Chief Complaint:  Follow Up Visit 09/16/2017  OPERATIVE REPORT PREOPERATIVE DIAGNOSIS:  Right lower lobe lung mass. POSTOPERATIVE DIAGNOSES: 1. Right lower lobe lung mass. 2. Non-small cell carcinoma of the lung. PROCEDURES PERFORMED: 1. Bronchoscopy. 2. Right video-assisted thoracoscopy. 3. Wedge resection, right lower lobe. 4. Completion right lower lobectomy with lymph node dissection. 5. Placement of On-Q. SURGEON:  Lanelle Bal, MD   Cancer Staging Lung cancer, Right lower lobe [facility]) Staging form: Lung, AJCC 8th Edition - Clinical: No stage assigned - Unsigned - Pathologic stage from 09/19/2017: Stage IA2 (pT1b, pN0, cM0) - Signed by [name], MD on 09/19/2017   History of Present Illness:      Patient  returns after resection of stage I a right lower lobe non-small cell lung cancer in March 2019.  She denies any pulmonary symptoms .  She had a follow-up CT scan of the chest and echocardiogram in the spring 2020.  on her scans she does have a mildly dilated ascending aorta, with no associated murmur of aortic insufficiency.  Echocardiogram could not definitively say if she had a bicuspid or trileaflet aortic valve.  He tells me that her sister who lives out of the country is also being followed for a dilated ascending aorta, though the patient has no specific details about this. There is no family history of aortic dissection or ascending aortic aneurysm repair  Since last seen the patient notes paresthesias over the right lower chest and right upper abdomen.  She notes this got worse after a Covid injection.  She has tried lidocaine patches  Zubrod Score: At the time of surgery this  patient's most appropriate activity status/level should be described as: [x]     0    Normal activity, no symptoms []     1    Restricted in physical strenuous activity but ambulatory, able to do out light work []     2    Ambulatory and capable of self care, unable to do work activities, up and about                 >50 % of waking hours                                                                                   []     3    Only limited self care, in bed greater than 50% of waking hours []     4    Completely disabled, no self care, confined to bed or chair []     5    Moribund  Social History   Tobacco Use  Smoking Status Former Smoker  . Packs/day: 1.00  . Years: 40.00  . Pack years: 40.00  . Types: Cigarettes  . Quit date:  12/03/2008  . Years since quitting: 10.7  Smokeless Tobacco Never Used       Allergies  Allergen Reactions  . Codeine Nausea And Vomiting  . Quinolones     Patient was warned about not using Cipro and similar antibiotics. Recent studies have raised concern that fluoroquinolone antibiotics could be associated with an increased risk of aortic aneurysm Fluoroquinolones have non-antimicrobial properties that might jeopardise the integrity of the extracellular matrix of the vascular wall In a  propensity score matched cohort study in [place], there was a 66% increased rate of aortic aneurysm or dissection associated with oral fluoroquinolone use, compared wit    Current Outpatient Medications  Medication Sig Dispense Refill  . acetaminophen (TYLENOL) 325 MG tablet Take 650 mg by mouth every 6 (six) hours as needed for mild pain.    . fluticasone (FLONASE) 50 MCG/ACT nasal spray Place 1 spray into both nostrils daily.    Marland Kitchen levothyroxine (SYNTHROID) 75 MCG tablet Take 1 tablet (75 mcg total) by mouth daily. 90 tablet 3  . metoprolol tartrate (LOPRESSOR) 25 MG tablet TAKE 1 TABLET BY MOUTH TWICE A DAY PLEASE MAKE YEARLY APPT 180 tablet 3  . Multiple  Vitamins-Minerals (CENTRUM ADULTS PO) Take 1 tablet by mouth daily.    Marland Kitchen Propylene Glycol-Glycerin (SOOTHE OP) Place 1 drop into both eyes 2 (two) times daily as needed (dry eyes).     No current facility-administered medications for this visit.       Physical Exam: BP 130/80   Pulse 62   Temp 97.7 F (36.5 C) (Skin)   Resp 20   Ht 5\' 7"  (1.702 m)   Wt 142 lb (64.4 kg)   SpO2 98% Comment: RA  BMI 22.24 kg/m  General appearance: alert, cooperative and no distress Head: Normocephalic, without obvious abnormality, atraumatic Neck: no adenopathy, no carotid bruit, no JVD, supple, symmetrical, trachea midline and thyroid not enlarged, symmetric, no tenderness/mass/nodules Lymph nodes: Cervical, supraclavicular, and axillary nodes normal. Resp: clear to auscultation bilaterally Cardio: regular rate and rhythm, S1, S2 normal, no murmur, click, rub or gallop GI: soft, non-tender; bowel sounds normal; no masses,  no organomegaly Extremities: extremities normal, atraumatic, no cyanosis or edema and Homans sign is negative, no sign of DVT Neurologic: Grossly normal Right chest incisions are well-healed, no evidence of lung herniation  Diagnostic Studies & Laboratory data:         Recent Radiology Findings: Recent CT and PET scan done in the fall 2020 were reviewed no evidence of malignancy-no change in the size of the aorta-   CLINICAL DATA:  RIGHT lung cancer diagnosed February 2019. RIGHT lower lobectomy.  EXAM: CT CHEST WITH CONTRAST  TECHNIQUE: Multidetector CT imaging of the chest was performed during intravenous contrast administration.  CONTRAST:  74mL OMNIPAQUE IOHEXOL 300 MG/ML  SOLN  COMPARISON:  PET scan 08/25/2017  FINDINGS: Cardiovascular: 4 cm ascending aorta. No significant vascular findings. Normal heart size. No pericardial effusion.  Mediastinum/Nodes: No axillary or supraclavicular adenopathy. No mediastinal adenopathy. Esophagus normal   Lungs/Pleura: RIGHT lower lobectomy. Small moderate RIGHT effusion. No nodularity in the RIGHT lung. Several benign-appearing nodules are present along the superior aspect of the RIGHT oblique fissure.  The LEFT lung is clear.  Upper Abdomen: Limited view of the liver, kidneys, pancreas are unremarkable. Normal adrenal glands.  Musculoskeletal: No aggressive osseous lesion  IMPRESSION: 1. No evidence of RIGHT lung cancer recurrence following RIGHT lower lobectomy. 2. RIGHT lower lobe effusion is new from prior. 3. No  evidence of metastatic adenopathy.   Electronically Signed   By: Suzy Bouchard M.D.   On: 05/04/2018 17:17  Patient does have a dilated ascending aorta 4.2 cm, appears stable when compared to her previous PET scan.  CLINICAL DATA:  Right lung cancer.  Right lateral chest pain.  EXAM: CT CHEST WITH CONTRAST  TECHNIQUE: Multidetector CT imaging of the chest was performed during intravenous contrast administration.  CONTRAST:  4mL OMNIPAQUE IOHEXOL 300 MG/ML  SOLN  COMPARISON:  05/04/2018.  FINDINGS: Cardiovascular: Atherosclerotic calcification of the aorta and coronary arteries. Heart size normal. No pericardial effusion.  Mediastinum/Nodes: No pathologically enlarged mediastinal, hilar or axillary lymph nodes. Esophagus is grossly unremarkable.  Lungs/Pleura: Centrilobular emphysema. Right lower lobectomy. Small partially loculated pleural effusion at the base of the right hemithorax, stable. Left lung is clear. Airway is otherwise unremarkable.  Upper Abdomen: Visualized portions of the liver, adrenal glands, kidneys, spleen, pancreas, stomach and bowel are grossly unremarkable. No upper abdominal adenopathy.  Musculoskeletal: Degenerative changes in the spine.  IMPRESSION: 1. No evidence of recurrent or metastatic disease. 2. Small, partially loculated right pleural effusion, stable. 3.  Aortic atherosclerosis  (ICD10-170.0). 4.  Emphysema (ICD10-J43.9).   Electronically Signed   By: Lorin Picket M.D.   On: 11/17/2018 13:45  The CT report does not mention the size of the aorta on my measurement it ranges between 3.9 - 4.2 cm unchanged from previous scan.  I have independently reviewed the above radiology findings and reviewed findings  with the patient.  Result status: Final result     ECHOCARDIOGRAM REPORT       Patient Name:   ALEXEA BLASE Date of Exam: 09/02/2018 Medical Rec #:  [id number]      Height:       67.0 in Accession #:    [id number]     Weight:       146.4 lb Date of Birth:  [birth year]      BSA:          1.77 m Patient Age:    1 years       BP:           124/70 mmHg Patient Gender: F              HR:           NA bpm. Exam Location:  [facility]    Procedure: 2D Echo, Cardiac Doppler and Color Doppler  Indications:    I11.9 Hypertensive Heart Disease                 I77.810 Ascending Aortic Dilation   History:        Patient has no prior history of Echocardiogram examinations.                 RBBB; Risk Factors: Hypertension, Dyslipidemia, Family History                 of Coronary Artery Disease and Former Smoker. Right Lung Cancer                 status post Right lower lobectomy, Seizures, Palpitations.   Sonographer:    Deliah Boston RDCS Referring Phys: [phone] PHILIP J NAHSER    Sonographer Comments: Unable to aquire EKG, leads broken IMPRESSIONS    1. The left ventricle has normal systolic function, with an ejection fraction of 55-60%. The cavity size was normal. Left ventricular diastolic Doppler parameters are consistent with impaired relaxation. Indeterminate filling pressures.  2. The right ventricle has normal systolic function. The cavity was normal. There is no increase in right ventricular wall thickness.  3. The aortic valve was not assessed Aortic valve regurgitation is trivial by color flow Doppler.  FINDINGS  Left Ventricle:  The left ventricle has normal systolic function, with an ejection fraction of 55-60%. The cavity size was normal. There is no increase in left ventricular wall thickness. Left ventricular diastolic Doppler parameters are consistent with  impaired relaxation. Indeterminate filling pressures Right Ventricle: The right ventricle has normal systolic function. The cavity was normal. There is no increase in right ventricular wall thickness. Left Atrium: left atrial size was normal in size Right Atrium: right atrial size was normal in size. Right atrial pressure is estimated at 3 mmHg. Interatrial Septum: No atrial level shunt detected by color flow Doppler. Pericardium: There is no evidence of pericardial effusion. Mitral Valve: The mitral valve is normal in structure. Mitral valve regurgitation is trivial by color flow Doppler. Tricuspid Valve: The tricuspid valve is normal in structure. Tricuspid valve regurgitation is trivial by color flow Doppler. Aortic Valve: The aortic valve was not assessed Aortic valve regurgitation is trivial by color flow Doppler. Pulmonic Valve: The pulmonic valve was normal in structure. Pulmonic valve regurgitation was not assessed by color flow Doppler. Venous: The inferior vena cava is normal in size with greater than 50% respiratory variability.   LEFT VENTRICLE PLAX 2D LVIDd:         3.82 cm  Diastology LVIDs:         2.83 cm  LV e' lateral:   9.46 cm/s LV PW:         1.18 cm  LV E/e' lateral: 7.1 LV IVS:        0.82 cm  LV e' medial:    5.66 cm/s LVOT diam:     2.40 cm  LV E/e' medial:  11.9 LV SV:         32 ml LV SV Index:   18.25 LVOT Area:     4.52 cm  RIGHT VENTRICLE RV S prime:     10.30 cm/s TAPSE (M-mode): 1.3 cm RVSP:           21.8 mmHg  LEFT ATRIUM           Index       RIGHT ATRIUM           Index LA diam:      3.20 cm 1.81 cm/m  RA Pressure: 3 mmHg LA Vol (A4C): 25.6 ml 14.45 ml/m RA Area:     14.20 cm                                    RA Volume:   35.70 ml  20.16 ml/m  AORTIC VALVE LVOT Vmax:   147.00 cm/s LVOT Vmean:  78.300 cm/s LVOT VTI:    0.217 m   AORTA Ao Root diam: 3.70 cm Ao Asc diam:  3.00 cm  MITRAL VALVE              TRICUSPID VALVE MV Area (PHT): cm        TR Peak grad:   18.8 mmHg MV PHT:        msec       TR Vmax:        217.00 cm/s MV Decel Time: 232 msec   RVSP:  21.8 mmHg MV E velocity: 67.40 cm/s MV A velocity: 87.30 cm/s SHUNTS MV E/A ratio:  0.77       Systemic VTI:  0.22 m                           Systemic Diam: 2.40 cm    Skeet Latch MD Electronically signed by Skeet Latch MD Signature Date/Time: 09/02/2018/9:56:40 PM  Notes recorded by Thayer Headings, MD on 09/07/2018 at 5:54 PM EDT  I have personally reviewed the echo images and the aortic valve was not seen well enough to say whether its a regular 3 leaflet valve or a bicuspid Av.  It seems to behave like a typical 3 leaflet valve  Will consider a TEE for further evaluation at some point in the future.         Recent Labs: Lab Results  Component Value Date   WBC 11.5 (H) 05/21/2019   HGB 12.7 05/21/2019   HCT 38.9 05/21/2019   PLT 359 05/21/2019   GLUCOSE 82 05/21/2019   CHOL 184 03/26/2019   TRIG 84.0 03/26/2019   HDL 62.90 03/26/2019   LDLCALC 104 (H) 03/26/2019   ALT 17 05/21/2019   AST 18 05/21/2019   NA 142 05/21/2019   K 4.6 05/21/2019   CL 107 05/21/2019   CREATININE 0.79 05/21/2019   BUN 11 05/21/2019   CO2 25 05/21/2019   TSH 4.02 05/05/2019   INR 0.97 09/11/2017   Aortic Size Index=    4.2     /Body surface area is 1.74 meters squared. = 2.38  < 2.75 cm/m2      4% risk per year 2.75 to 4.25          8% risk per year > 4.25 cm/m2    20% risk per year     Assessment / Plan:   #1 status post right lower lobectomy for stage I non-small cell carcinoma of the lung, without evidence of recurrence on CT scan/and PET scan December 2020 -follow-up CT scan already ordered by  oncology plan to see the patient 6 months.    #2 incidentally noted ascending aorta 4.2 cm, no murmur associated with aortic insufficiency,-unchanged on follow-up CT scan continue to monitor  #3 dysesthesias over the right upper abdomen and right chest-possibly related to neuropathic pain from surgical resection-we will refer to pain clinic.  [name] 08/19/2019 9:41 AM

## 2019-08-24 ENCOUNTER — Encounter: Payer: Self-pay | Admitting: Physical Medicine and Rehabilitation

## 2019-09-09 ENCOUNTER — Encounter: Payer: Self-pay | Admitting: Physical Medicine and Rehabilitation

## 2019-09-09 ENCOUNTER — Other Ambulatory Visit: Payer: Self-pay

## 2019-09-09 ENCOUNTER — Encounter: Payer: Medicare PPO | Attending: Physical Medicine and Rehabilitation | Admitting: Physical Medicine and Rehabilitation

## 2019-09-09 VITALS — BP 128/85 | HR 66 | Temp 97.5°F | Ht 67.0 in | Wt 142.0 lb

## 2019-09-09 DIAGNOSIS — G588 Other specified mononeuropathies: Secondary | ICD-10-CM

## 2019-09-09 MED ORDER — GABAPENTIN 100 MG PO CAPS: 100.0000 mg | ORAL_CAPSULE | Freq: Three times a day (TID) | ORAL | 1 refills | Status: DC

## 2019-09-09 NOTE — Progress Notes (Signed)
Subjective:    Patient ID: Margaret Charles, female    DOB: 1951-04-30, 69 y.o.   MRN: [id number]  HPI  Margaret Charles is a very pleasant 69 year old woman who presents to establish care for chest wall pain.  She has been having 2 years of pain since undergoing lobectomy for lung cancer. She has been able to tolerate the pain well and continue with the activities that she loves. It feels like a dull ache under her right breast and radiates to the right side of her back under her scapula. She wears loose bras to reduce pressure in this area. Often at rest she does not notice it but when she does a sudden movement she experience sharp pain in this area that lasts for 30-45 seconds. She states that if the pain never resolves she would be ok--it does not significantly bother her. She loves to do water aerobics and initially felt pressure from the flotation device on this area, but modified her exercise with a foam tube and this has worked well for her. She asks whether it would be safe for her to return to water aerobics given the pandemic.   Pain Inventory Average Pain 6 Pain Right Now 4 My pain is intermittent, sharp, stabbing, tingling and aching  In the last 24 hours, has pain interfered with the following? General activity 3 Relation with others 0 Enjoyment of life 3 What TIME of day is your pain at its worst? daytime Sleep (in general) Good  Pain is worse with: bending and some activites Pain improves with: rest Relief from Meds: 0  Mobility walk without assistance ability to climb steps?  yes do you drive?  yes  Function retired  Neuro/Psych numbness tingling spasms  Prior Studies Any changes since last visit?  no  Physicians involved in your care Any changes since last visit?  no   Family History  Problem Relation Age of Onset  . Dementia Mother   . Heart attack Mother        age 40. after hip fracture.   . Hypertension Mother   . Prostate cancer Father   .  Hypertension Father   . Breast cancer Sister   . Hypertension Sister   . Heart attack Sister        1. former smoker  . Heart attack Paternal Grandfather   . Hypertension Sister   . Colon cancer Other        family History  . Colon polyps Other        family history   Social History   Socioeconomic History  . Marital status: Single    Spouse name: Not on file  . Number of children: Not on file  . Years of education: Not on file  . Highest education level: Not on file  Occupational History  . Occupation: Retired Pharmacist, hospital  Tobacco Use  . Smoking status: Former Smoker    Packs/day: 1.00    Years: 40.00    Pack years: 40.00    Types: Cigarettes    Quit date: 12/03/2008    Years since quitting: 10.7  . Smokeless tobacco: Never Used  Substance and Sexual Activity  . Alcohol use: Not Currently    Comment: hx  etoh  38 yrs  . Drug use: Never  . Sexual activity: Not Currently  Other Topics Concern  . Not on file  Social History Narrative   Single. Lives alone.    Lived in [place] for ome years to help  with parents but otherwise in [place] most of life.       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 Difficulty of Paying Living Expenses:   Food Insecurity:   . Worried About Charity fundraiser in the Last Year:   . Arboriculturist in the Last Year:   Transportation Needs:   . Film/video editor (Medical):   Marland Kitchen Lack of Transportation (Non-Medical):   Physical Activity:   . Days of Exercise per Week:   . Minutes of Exercise per Session:   Stress:   . Feeling of Stress :   Social Connections:   . Frequency of Communication with Friends and Family:   . Frequency of Social Gatherings with Friends and Family:   . Attends Religious Services:   .  Active Member of Clubs or Organizations:   . Attends Archivist Meetings:   Marland Kitchen Marital Status:    Past Surgical History:  Procedure Laterality Date  . BLADDER SURGERY  1985   Long term incontinence. [facility]- used small intestine to build bladder  . BREAST BIOPSY  2001  . CATARACT EXTRACTION W/ INTRAOCULAR LENS  IMPLANT, BILATERAL    . Intestinal blockage  1989 and 1990   bowel obstruction surgery  . Lung cancer removal   08/2017   Right lower lobe  . VIDEO ASSISTED THORACOSCOPY (VATS)/WEDGE RESECTION Right 09/16/2017   Procedure: VIDEO ASSISTED THORACOSCOPY (VATS)/LUNG RESECTION;  Surgeon: [name], MD;  Location: [facility];  Service: Thoracic;  Laterality: Right;  Marland Kitchen VIDEO BRONCHOSCOPY N/A 09/16/2017   Procedure: VIDEO BRONCHOSCOPY;  Surgeon: [name], MD;  Location[facility];  Service: Thoracic;  Laterality: N/A;  . WRIST SURGERY     de quervains tenosynovitis. Dr. Maureen Ralphs actually did this.    Past Medical History:  Diagnosis Date  . Arthritis    ddd  . Chronic headaches    imprved recently per patient   . Emphysema of lung ([place])    asymptomatic. noted on CT  . Family history of colon cancer   . History of adenomatous polyp of colon    2015 precancerous polyp history- due spring 2020.  5 years  . History of seizures    1980s few ??? seizure activity  (vagal response)  . Hypertension   . Lung cancer ([place]) dx'd 07/2017  . Palpitations   . RBBB (right bundle branch block)    palpitations  . Skin cancer    skin basal cell cancer   BP 128/85   Pulse 66   Temp (!) 97.5 F (36.4 C)   Ht 5\' 7"  (1.702 m)   Wt 142 lb (64.4 kg)   SpO2 98%   BMI 22.24 kg/m   Opioid Risk Score:   Fall Risk Score:  `1  Depression screen PHQ 2/9  Depression screen [facility] 2/9 03/26/2019 02/18/2019 08/27/2018 08/13/2018 03/20/2018 12/29/2017  Decreased Interest 0 0 0 0 0 0  Down, Depressed, Hopeless 0 0 0 0 0 0  PHQ - 2 Score 0 0 0 0 0 0     Review of Systems  Constitutional:  Negative.   HENT: Negative.   Eyes: Negative.   Respiratory: Negative.   Cardiovascular: Negative.   Gastrointestinal: Negative.  Endocrine: Negative.   Genitourinary: Negative.   Musculoskeletal: Negative.   Skin: Negative.   Allergic/Immunologic: Negative.   Neurological: Negative.   Hematological: Negative.   Psychiatric/Behavioral: Negative.   All other systems reviewed and are negative.      Objective:   Physical Exam Gen: no distress, normal appearing HEENT: oral mucosa pink and moist, NCAT Cardio: Reg rate Chest: normal effort, normal rate of breathing Abd: soft, non-distended Ext: no edema Skin: intact Neuro: AOX3. Musculoskeletal: No erythema or bruising under bra line or along back. Mild tenderness to palpation under bra line on right side, continuing to under right scapula. FROM of upper extremities.  Psych: pleasant, normal affect       Assessment & Plan:  Mrs. Flegal is a very pleasant 69 year old woman who presents to establish care for chest wall pain.  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 Advised her to let me know her response in a few days she we can adjust dose as necessary.  -Discussed possible side effects to look out for. -Commended her love for water aerobics and advised that she can resume; she has received both COVID vaccinations. -Commended activity modification and use of loose bras.   All questions answered. Can return to clinic in 1 month.

## 2019-09-14 ENCOUNTER — Other Ambulatory Visit: Payer: Self-pay

## 2019-09-14 ENCOUNTER — Ambulatory Visit (INDEPENDENT_AMBULATORY_CARE_PROVIDER_SITE_OTHER): Payer: Medicare PPO

## 2019-09-14 VITALS — BP 116/62 | HR 64 | Temp 96.3°F | Ht 67.0 in | Wt 141.4 lb

## 2019-09-14 DIAGNOSIS — Z0000 Encounter for general adult medical examination without abnormal findings: Secondary | ICD-10-CM

## 2019-09-14 NOTE — Patient Instructions (Signed)
Margaret Charles , Thank you for taking time to come for your Medicare Wellness Visit. I appreciate your ongoing commitment to your health goals. Please review the following plan we discussed and let me know if I can assist you in the future.   Screening recommendations/referrals: Colorectal Screening: up to date; colonoscopy 05/06/19 Mammogram: up to date; last 08/16/19 Bone Density: up to date; PET scan done 05/2019  Vision and Dental Exams: Recommended annual ophthalmology exams for early detection of glaucoma and other disorders of the eye Recommended annual dental exams for proper oral hygiene  Vaccinations: Influenza vaccine: completed 03/26/19 Pneumococcal vaccine: up to date; last 03/19/17 Tdap vaccine: up to date; last 09/15/14 Shingles vaccine: Shingrix completed   Advanced directives: Please bring a copy of your POA (Power of [place]) and/or Living Will to your next appointment.  Goals: Recommend to drink at least 6-8 8oz glasses of water per day and consume a balanced diet rich in fresh fruits and vegetables.   Next appointment: Please schedule your Annual Wellness Visit with your Nurse Health Advisor in one year.  Preventive Care 71 Years and Older, Female Preventive care refers to lifestyle choices and visits with your health care provider that can promote health and wellness. What does preventive care include?  A yearly physical exam. This is also called an annual well check.  Dental exams once or twice a year.  Routine eye exams. Ask your health care provider how often you should have your eyes checked.  Personal lifestyle choices, including:  Daily care of your teeth and gums.  Regular physical activity.  Eating a healthy diet.  Avoiding tobacco and drug use.  Limiting alcohol use.  Practicing safe sex.  Taking low-dose aspirin every day if recommended by your health care provider.  Taking vitamin and mineral supplements as recommended by your health care  provider. What happens during an annual well check? The services and screenings done by your health care provider during your annual well check will depend on your age, overall health, lifestyle risk factors, and family history of disease. Counseling  Your health care provider may ask you questions about your:  Alcohol use.  Tobacco use.  Drug use.  Emotional well-being.  Home and relationship well-being.  Sexual activity.  Eating habits.  History of falls.  Memory and ability to understand (cognition).  Work and work Statistician.  Reproductive health. Screening  You may have the following tests or measurements:  Height, weight, and BMI.  Blood pressure.  Lipid and cholesterol levels. These may be checked every 5 years, or more frequently if you are over 17 years old.  Skin check.  Lung cancer screening. You may have this screening every year starting at age 77 if you have a 30-pack-year history of smoking and currently smoke or have quit within the past 15 years.  Fecal occult blood test (FOBT) of the stool. You may have this test every year starting at age 52.  Flexible sigmoidoscopy or colonoscopy. You may have a sigmoidoscopy every 5 years or a colonoscopy every 10 years starting at age 69.  Hepatitis C blood test.  Hepatitis B blood test.  Sexually transmitted disease (STD) testing.  Diabetes screening. This is done by checking your blood sugar (glucose) after you have not eaten for a while (fasting). You may have this done every 1-3 years.  Bone density scan. This is done to screen for osteoporosis. You may have this done starting at age 63.  Mammogram. This may be done  every 1-2 years. Talk to your health care provider about how often you should have regular mammograms. Talk with your health care provider about your test results, treatment options, and if necessary, the need for more tests. Vaccines  Your health care provider may recommend certain  vaccines, such as:  Influenza vaccine. This is recommended every year.  Tetanus, diphtheria, and acellular pertussis (Tdap, Td) vaccine. You may need a Td booster every 10 years.  Zoster vaccine. You may need this after age 53.  Pneumococcal 13-valent conjugate (PCV13) vaccine. One dose is recommended after age 23.  Pneumococcal polysaccharide (PPSV23) vaccine. One dose is recommended after age 20. Talk to your health care provider about which screenings and vaccines you need and how often you need them. This information is not intended to replace advice given to you by your health care provider. Make sure you discuss any questions you have with your health care provider. Document Released: 07/07/2015 Document Revised: 02/28/2016 Document Reviewed: 04/11/2015 Elsevier Interactive Patient Education  2017 [place] Prevention in the Home Falls can cause injuries. They can happen to people of all ages. There are many things you can do to make your home safe and to help prevent falls. What can I do on the outside of my home?  Regularly fix the edges of walkways and driveways and fix any cracks.  Remove anything that might make you trip as you walk through a door, such as a raised step or threshold.  Trim any bushes or trees on the path to your home.  Use bright outdoor lighting.  Clear any walking paths of anything that might make someone trip, such as rocks or tools.  Regularly check to see if handrails are loose or broken. Make sure that both sides of any steps have handrails.  Any raised decks and porches should have guardrails on the edges.  Have any leaves, snow, or ice cleared regularly.  Use sand or salt on walking paths during winter.  Clean up any spills in your garage right away. This includes oil or grease spills. What can I do in the bathroom?  Use night lights.  Install grab bars by the toilet and in the tub and shower. Do not use towel bars as grab  bars.  Use non-skid mats or decals in the tub or shower.  If you need to sit down in the shower, use a plastic, non-slip stool.  Keep the floor dry. Clean up any water that spills on the floor as soon as it happens.  Remove soap buildup in the tub or shower regularly.  Attach bath mats securely with double-sided non-slip rug tape.  Do not have throw rugs and other things on the floor that can make you trip. What can I do in the bedroom?  Use night lights.  Make sure that you have a light by your bed that is easy to reach.  Do not use any sheets or blankets that are too big for your bed. They should not hang down onto the floor.  Have a firm chair that has side arms. You can use this for support while you get dressed.  Do not have throw rugs and other things on the floor that can make you trip. What can I do in the kitchen?  Clean up any spills right away.  Avoid walking on wet floors.  Keep items that you use a lot in easy-to-reach places.  If you need to reach something above you, use a  strong step stool that has a grab bar.  Keep electrical cords out of the way.  Do not use floor polish or wax that makes floors slippery. If you must use wax, use non-skid floor wax.  Do not have throw rugs and other things on the floor that can make you trip. What can I do with my stairs?  Do not leave any items on the stairs.  Make sure that there are handrails on both sides of the stairs and use them. Fix handrails that are broken or loose. Make sure that handrails are as long as the stairways.  Check any carpeting to make sure that it is firmly attached to the stairs. Fix any carpet that is loose or worn.  Avoid having throw rugs at the top or bottom of the stairs. If you do have throw rugs, attach them to the floor with carpet tape.  Make sure that you have a light switch at the top of the stairs and the bottom of the stairs. If you do not have them, ask someone to add them for  you. What else can I do to help prevent falls?  Wear shoes that:  Do not have high heels.  Have rubber bottoms.  Are comfortable and fit you well.  Are closed at the toe. Do not wear sandals.  If you use a stepladder:  Make sure that it is fully opened. Do not climb a closed stepladder.  Make sure that both sides of the stepladder are locked into place.  Ask someone to hold it for you, if possible.  Clearly mark and make sure that you can see:  Any grab bars or handrails.  First and last steps.  Where the edge of each step is.  Use tools that help you move around (mobility aids) if they are needed. These include:  Canes.  Walkers.  Scooters.  Crutches.  Turn on the lights when you go into a dark area. Replace any light bulbs as soon as they burn out.  Set up your furniture so you have a clear path. Avoid moving your furniture around.  If any of your floors are uneven, fix them.  If there are any pets around you, be aware of where they are.  Review your medicines with your doctor. Some medicines can make you feel dizzy. This can increase your chance of falling. Ask your doctor what other things that you can do to help prevent falls. This information is not intended to replace advice given to you by your health care provider. Make sure you discuss any questions you have with your health care provider. Document Released: 04/06/2009 Document Revised: 11/16/2015 Document Reviewed: 07/15/2014 Elsevier Interactive Patient Education  2017 [employer]..

## 2019-09-14 NOTE — Progress Notes (Signed)
Subjective:   Margaret Charles is a 69 y.o. female who presents for Medicare Annual (Subsequent) preventive examination.  Review of Systems:   Cardiac Risk Factors include: advanced age (>61men, >37 women);hypertension    Objective:     Vitals: BP 116/62   Pulse 64   Temp (!) 96.3 F (35.7 C) (Temporal)   Ht 5\' 7"  (1.702 m)   Wt 141 lb 6.4 oz (64.1 kg)   SpO2 97%   BMI 22.15 kg/m   Body mass index is 22.15 kg/m.  Advanced Directives 09/14/2019 06/05/2018 05/06/2018 11/03/2017 09/16/2017 09/11/2017  Does Patient Have a Medical Advance Directive? Yes Yes Yes Yes Yes Yes  Type of Advance Directive Living will;Healthcare Power of [place];Living will - [place];Living will [place];Living will [place];Living will  Does patient want to make changes to medical advance directive? No - Patient declined - - - No - Patient declined -  Copy of [place] in Chart? No - copy requested - - Yes Yes Yes    Tobacco Social History   Tobacco Use  Smoking Status Former Smoker  . Packs/day: 1.00  . Years: 40.00  . Pack years: 40.00  . Types: Cigarettes  . Quit date: 12/03/2008  . Years since quitting: 10.7  Smokeless Tobacco Never Used     Counseling given: Not Answered   Clinical Intake:  Pre-visit preparation completed: Yes  Pain : No/denies pain  Diabetes: No  How often do you need to have someone help you when you read instructions, pamphlets, or other written materials from your doctor or pharmacy?: 1 - Never  Interpreter Needed?: No  Information entered by :: Denman George LPN  Past Medical History:  Diagnosis Date  . Arthritis    ddd  . Chronic headaches    imprved recently per patient   . Emphysema of lung ([place])    asymptomatic. noted on CT  . Family history of colon cancer   . History of adenomatous polyp of colon    2015 precancerous polyp history- due  spring 2020.  5 years  . History of seizures    1980s few ??? seizure activity  (vagal response)  . Hypertension   . Lung cancer ([place]) dx'd 07/2017  . Palpitations   . RBBB (right bundle branch block)    palpitations  . Skin cancer    skin basal cell cancer   Past Surgical History:  Procedure Laterality Date  . BLADDER SURGERY  1985   Long term incontinence. [facility]- used small intestine to build bladder  . BREAST BIOPSY  2001  . CATARACT EXTRACTION W/ INTRAOCULAR LENS  IMPLANT, BILATERAL    . Intestinal blockage  1989 and 1990   bowel obstruction surgery  . Lung cancer removal   08/2017   Right lower lobe  . VIDEO ASSISTED THORACOSCOPY (VATS)/WEDGE RESECTION Right 09/16/2017   Procedure: VIDEO ASSISTED THORACOSCOPY (VATS)/LUNG RESECTION;  Surgeon: [name], MD;  Location: [facility];  Service: Thoracic;  Laterality: Right;  Marland Kitchen VIDEO BRONCHOSCOPY N/A 09/16/2017   Procedure: VIDEO BRONCHOSCOPY;  Surgeon: [name], MD;  [facility];  Service: Thoracic;  Laterality: N/A;  . WRIST SURGERY     de quervains tenosynovitis. Dr. Maureen Ralphs actually did this.    Family History  Problem Relation Age of Onset  . Dementia Mother   . Heart attack Mother        age 48. after  hip fracture.   . Hypertension Mother   . Prostate cancer Father   . Hypertension Father   . Breast cancer Sister   . Hypertension Sister   . Heart attack Sister        37. former smoker  . Heart attack Paternal Grandfather   . Hypertension Sister   . Colon cancer Other        family History  . Colon polyps Other        family history   Social History   Socioeconomic History  . Marital status: Single    Spouse name: Not on file  . Number of children: Not on file  . Years of education: Not on file  . Highest education level: Not on file  Occupational History  . Occupation: Retired Pharmacist, hospital  Tobacco Use  . Smoking status: Former Smoker    Packs/day: 1.00    Years: 40.00    Pack years: 40.00     Types: Cigarettes    Quit date: 12/03/2008    Years since quitting: 10.7  . Smokeless tobacco: Never Used  Substance and Sexual Activity  . Alcohol use: Not Currently    Comment: hx  etoh  37 yrs  . Drug use: Never  . Sexual activity: Not Currently  Other Topics Concern  . Not on file  Social History Narrative   Single. Lives alone.    Lived in [place] for some years to help with parents but otherwise in [place] most of life.       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 Difficulty of Paying Living Expenses:   Food Insecurity:   . Worried About Charity fundraiser in the Last Year:   . Arboriculturist in the Last Year:   Transportation Needs:   . Film/video editor (Medical):   Marland Kitchen Lack of Transportation (Non-Medical):   Physical Activity:   . Days of Exercise per Week:   . Minutes of Exercise per Session:   Stress:   . Feeling of Stress :   Social Connections:   . Frequency of Communication with Friends and Family:   . Frequency of Social Gatherings with Friends and Family:   . Attends Religious Services:   . Active Member of Clubs or Organizations:   . Attends Archivist Meetings:   Marland Kitchen Marital Status:     Outpatient Encounter Medications as of 09/14/2019  Medication Sig  . acetaminophen (TYLENOL) 325 MG tablet Take 650 mg by mouth every 6 (six) hours as needed for mild pain.  . fluticasone (FLONASE) 50 MCG/ACT nasal spray Place 1 spray into both nostrils daily.  Marland Kitchen gabapentin (NEURONTIN) 100 MG capsule Take 1 capsule (100 mg total) by mouth 3 (three) times daily.  Marland Kitchen levothyroxine (SYNTHROID) 75 MCG tablet Take 1 tablet (75 mcg total) by mouth daily.  . metoprolol tartrate (LOPRESSOR) 25 MG tablet TAKE 1 TABLET BY MOUTH TWICE A DAY PLEASE  MAKE YEARLY APPT  . Multiple Vitamins-Minerals (CENTRUM ADULTS PO) Take 1 tablet by mouth daily.  Marland Kitchen Propylene Glycol-Glycerin (SOOTHE OP) Place 1 drop into both eyes 2 (two) times daily as needed (dry eyes).   No facility-administered encounter medications on file as of 09/14/2019.  Activities of Daily Living In your present state of health, do you have any difficulty performing the following activities: 09/14/2019  Hearing? N  Vision? N  Difficulty concentrating or making decisions? N  Walking or climbing stairs? N  Dressing or bathing? N  Doing errands, shopping? N  Preparing Food and eating ? N  Using the Toilet? N  In the past six months, have you accidently leaked urine? N  Do you have problems with loss of bowel control? N  Managing your Medications? N  Managing your Finances? N  Housekeeping or managing your Housekeeping? N  Some recent data might be hidden    Patient Care Team: [name], MD as PCP - General (Family Medicine) Nahser, Wonda Cheng, MD as PCP - Cardiology (Cardiology) [name], MD as Consulting Physician (Cardiothoracic Surgery) Curt Bears, MD as Consulting Physician (Oncology) Ranell Patrick, Clide Deutscher, MD as Consulting Physician (Physical Medicine and Rehabilitation) Armbruster, Carlota Raspberry, MD as Consulting Physician (Gastroenterology) Danice Goltz, MD as Consulting Physician (Ophthalmology) Renato Shin, MD as Consulting Physician (Endocrinology)    Assessment:   This is a routine wellness examination for Vena.  Exercise Activities and Dietary recommendations Current Exercise Habits: Home exercise routine, Type of exercise: walking;Other - see comments(water aerobics), Time (Minutes): 45, Frequency (Times/Week): 4, Weekly Exercise (Minutes/Week): 180, Intensity: Mild  Goals   None     Fall Risk Fall Risk  09/14/2019 03/26/2019 02/18/2019 08/27/2018 03/20/2018  Falls in the past year? 0 0 0 0 No  Number falls in past yr: 0  0 0 - -  Injury with Fall? 0 0 0 - -  Follow up Falls evaluation completed;Education provided;Falls prevention discussed - - - -   Is the patient's home free of loose throw rugs in walkways, pet beds, electrical cords, etc?   yes      Grab bars in the bathroom? yes      Handrails on the stairs?   yes      Adequate lighting?   yes  Timed Get Up and Go performed: completed and within normal timeframe; no gait abnormalities noted   Depression Screen PHQ 2/9 Scores 09/14/2019 03/26/2019 02/18/2019 08/27/2018  PHQ - 2 Score 0 0 0 0     Cognitive Function   6CIT Screen 09/14/2019  What Year? 0 points  What month? 0 points  What time? 0 points  Count back from 20 0 points  Months in reverse 0 points  Repeat phrase 0 points  Total Score 0    Immunization History  Administered Date(s) Administered  . Fluad Quad(high Dose 65+) 03/26/2019  . Influenza Split 05/09/2011, 04/26/2012, 03/24/2014, 04/22/2014  . Influenza Whole 03/19/2017  . Influenza, High Dose Seasonal PF 04/24/2016, 03/20/2018  . Influenza, Seasonal, Injecte, Preservative Fre 04/11/2015  . Influenza-Unspecified 03/31/2017  . PPD Test 09/13/2014  . Pneumococcal Conjugate-13 06/15/2015  . Pneumococcal Polysaccharide-23 03/19/2017  . Tdap 09/15/2014  . Zoster 06/11/2013  . Zoster Recombinat (Shingrix) 03/13/2018, 05/12/2019    Qualifies for Shingles Vaccine? Patient has had 1st Shingrix and will have 2nd before 6 month period ends   Screening Tests Health Maintenance  Topic Date Due  . MAMMOGRAM  08/15/2021  . COLONOSCOPY  05/05/2022  . TETANUS/TDAP  09/14/2024  . INFLUENZA VACCINE  Completed  . DEXA SCAN  Completed  . Hepatitis C Screening  Completed  . PNA vac Low Risk Adult  Completed    Cancer Screenings: Lung: Low Dose CT Chest recommended if Age 77-80 years,  30 pack-year currently smoking OR have quit w/in 15years. Patient does not qualify. Breast:  Up to date on Mammogram? Yes   Up to date of Bone  Density/Dexa? Yes; PET scan 05/2019 Colorectal: colonoscopy 05/06/19    Plan:  I have personally reviewed and addressed the Medicare Annual Wellness questionnaire and have noted the following in the patient's chart:  A. Medical and social history B. Use of alcohol, tobacco or illicit drugs  C. Current medications and supplements D. Functional ability and status E.  Nutritional status F.  Physical activity G. Advance directives H. List of other physicians I.  Hospitalizations, surgeries, and ER visits in previous 12 months J.  [place] such as hearing and vision if needed, cognitive and depression L. Referrals, records requested, and appointments- none   In addition, I have reviewed and discussed with patient certain preventive protocols, quality metrics, and best practice recommendations. A written personalized care plan for preventive services as well as general preventive health recommendations were provided to patient.   Signed,  Denman George, LPN  Nurse Health Advisor   Nurse Notes: Patient has completed Covid vaccine Levan Hurst)

## 2019-09-20 ENCOUNTER — Other Ambulatory Visit: Payer: Self-pay | Admitting: Physical Medicine and Rehabilitation

## 2019-09-20 MED ORDER — GABAPENTIN 300 MG PO CAPS: 300.0000 mg | ORAL_CAPSULE | Freq: Three times a day (TID) | ORAL | 2 refills | Status: DC

## 2019-09-21 ENCOUNTER — Telehealth: Payer: Self-pay | Admitting: Family Medicine

## 2019-09-21 NOTE — Telephone Encounter (Signed)
I assume she means she has been fully vaccinated from COVID-19-team please abstract vaccination in.  Courtney-it is okay for her to resume water aerobics as long as she is fully vaccinated from COVID-19 and it has been 2 weeks from her last vaccination

## 2019-09-21 NOTE — Telephone Encounter (Signed)
Patient would like to know if you feel it is safe for her to resume water aerobics.

## 2019-09-21 NOTE — Telephone Encounter (Signed)
Patient states she has been vaccinated and wanted to start a few additional activities.  She states she has spoken about this with Loma Sousa.  Loma Sousa was to follow up with Dr. Yong Channel.  Patient is requesting a call back from [facility] in regard.

## 2019-10-07 ENCOUNTER — Ambulatory Visit: Payer: Medicare PPO | Admitting: Physical Medicine and Rehabilitation

## 2019-10-15 ENCOUNTER — Encounter: Payer: Self-pay | Admitting: Physical Medicine and Rehabilitation

## 2019-10-15 ENCOUNTER — Other Ambulatory Visit: Payer: Self-pay

## 2019-10-15 ENCOUNTER — Encounter: Payer: Medicare PPO | Attending: Physical Medicine and Rehabilitation | Admitting: Physical Medicine and Rehabilitation

## 2019-10-15 VITALS — BP 117/76 | HR 69 | Temp 97.2°F | Ht 67.0 in | Wt 143.0 lb

## 2019-10-15 DIAGNOSIS — G588 Other specified mononeuropathies: Secondary | ICD-10-CM | POA: Diagnosis present

## 2019-10-15 MED ORDER — AMITRIPTYLINE HCL 10 MG PO TABS: 10.0000 mg | ORAL_TABLET | Freq: Every day | ORAL | 0 refills | Status: DC

## 2019-10-15 NOTE — Progress Notes (Signed)
Subjective:    Patient ID: Thurley Francesconi, female    DOB: Oct 09, 1950, 69 y.o.   MRN: [id number]  HPI  Mrs. Pellicane is a very pleasant 69 year old woman who presents for follow-up of intercostal neuralgia.   She has been having 2 years of pain since undergoing lobectomy for lung cancer. She has been able to tolerate the pain well and continue with the activities that she loves. It feels like a dull ache under her right breast and radiates to the right side of her back under her scapula. She wears loose bras to reduce pressure in this area. Often at rest she does not notice it but when she does a sudden movement she experience sharp pain in this area that lasts for 30-45 seconds. She states that if the pain never resolves she would be ok--it does not significantly bother her. She loves to do water aerobics and initially felt pressure from the flotation device on this area, but modified her exercise with a foam tube and this has worked well for her. 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 She has been taking Gabapentin 300mg  TID with possibly a small amount of benefit, but she rates pain as 6/10 today which is the same rating as 1 month ago. The medication does make her a little sleepy.   Pain Inventory Average Pain 6 Pain Right Now 4 My pain is intermittent, sharp, burning, stabbing, tingling and aching  In the last 24 hours, has pain interfered with the following? General activity 3 Relation with others 0 Enjoyment of life 3 What TIME of day is your pain at its worst? 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  no  Physicians involved in your care Any changes since last  visit?  no   Family History  Problem Relation Age of Onset  . Dementia Mother   . Heart attack Mother        age 22. after hip fracture.   . Hypertension Mother   . Prostate cancer Father   . Hypertension Father   . Breast cancer Sister   . Hypertension Sister   . Heart attack Sister        72. former smoker  . Heart attack Paternal Grandfather   . Hypertension Sister   . Colon cancer Other        family History  . Colon polyps Other        family history   Social History   Socioeconomic History  . Marital status: Single    Spouse name: Not on file  . Number of children: Not on file  . Years of education: Not on file  . Highest education level: Not on file  Occupational History  . Occupation: Retired Pharmacist, hospital  Tobacco Use  . Smoking status: Former Smoker    Packs/day: 1.00    Years: 40.00    Pack years: 40.00    Types: Cigarettes    Quit date: 12/03/2008    Years since quitting: 10.8  . Smokeless tobacco: Never Used  Substance and Sexual Activity  . Alcohol use: Not Currently    Comment: hx  etoh  38 yrs  . Drug use: Never  .  Sexual activity: Not Currently  Other Topics Concern  . Not on file  Social History Narrative   Single. Lives alone.    Lived in [place] for some years to help with parents but otherwise in [place] most of life.       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 Difficulty of Paying Living Expenses:   Food Insecurity:   . Worried About Charity fundraiser in the Last Year:   . Arboriculturist in the Last Year:   Transportation Needs:   . Film/video editor (Medical):   Marland Kitchen Lack of Transportation (Non-Medical):   Physical Activity:   . Days of Exercise per Week:   . Minutes of Exercise per Session:   Stress:   .  Feeling of Stress :   Social Connections:   . Frequency of Communication with Friends and Family:   . Frequency of Social Gatherings with Friends and Family:   . Attends Religious Services:   . Active Member of Clubs or Organizations:   . Attends Archivist Meetings:   Marland Kitchen Marital Status:    Past Surgical History:  Procedure Laterality Date  . BLADDER SURGERY  1985   Long term incontinence. [facility]- used small intestine to build bladder  . BREAST BIOPSY  2001  . CATARACT EXTRACTION W/ INTRAOCULAR LENS  IMPLANT, BILATERAL    . Intestinal blockage  1989 and 1990   bowel obstruction surgery  . Lung cancer removal   08/2017   Right lower lobe  . VIDEO ASSISTED THORACOSCOPY (VATS)/WEDGE RESECTION Right 09/16/2017   Procedure: VIDEO ASSISTED THORACOSCOPY (VATS)/LUNG RESECTION;  Surgeon: [name], MD;  Location: [place];  Service: Thoracic;  Laterality: Right;  Marland Kitchen VIDEO BRONCHOSCOPY N/A 09/16/2017   Procedure: VIDEO BRONCHOSCOPY;  Surgeon: [name], MD;  Location: [facility];  Service: Thoracic;  Laterality: N/A;  . WRIST SURGERY     de quervains tenosynovitis. Dr. Maureen Ralphs actually did this.    Past Medical History:  Diagnosis Date  . Arthritis    ddd  . Chronic headaches    imprved recently per patient   . Emphysema of lung ([place])    asymptomatic. noted on CT  . Family history of colon cancer   . History of adenomatous polyp of colon    2015 precancerous polyp history- due spring 2020.  5 years  . History of seizures    1980s few ??? seizure activity  (vagal response)  . Hypertension   . Lung cancer ([place]) dx'd 07/2017  . Palpitations   . RBBB (right bundle branch block)    palpitations  . Skin cancer    skin basal cell cancer   Temp (!) 97.2 F (36.2 C)   Wt 143 lb (64.9 kg)   BMI 22.40 kg/m   Opioid Risk Score:   Fall Risk Score:  `1  Depression screen PHQ 2/9  Depression screen [facility] 2/9 09/14/2019 03/26/2019 02/18/2019 08/27/2018 08/13/2018 03/20/2018  12/29/2017  Decreased Interest 0 0 0 0 0 0 0  Down, Depressed, Hopeless 0 0 0 0 0 0 0  PHQ - 2 Score 0 0 0 0 0 0 0    Review of Systems  Constitutional: Negative.   HENT: Negative.  Eyes: Negative.   Respiratory: Negative.   Cardiovascular: Negative.   Gastrointestinal: Positive for constipation and diarrhea.  Endocrine: Negative.   Genitourinary: Negative.   Musculoskeletal:       Rib cage area pain  Skin: Negative.   Allergic/Immunologic: Negative.   Neurological:       Rib nerve pain (intercostal)  Hematological: Negative.   Psychiatric/Behavioral: Negative.   All other systems reviewed and are negative.      Objective:   Physical Exam Gen: no distress, normal appearing HEENT: oral mucosa pink and moist, NCAT Cardio: Reg rate Chest: normal effort, normal rate of breathing Abd: soft, non-distended Ext: no edema Skin: intact Neuro: AOX3. Musculoskeletal: No erythema or bruising under bra line or along back. Mild tenderness to palpation under bra line on right side, continuing to under right scapula. FROM of upper extremities.  Psych: pleasant, normal affect     Assessment & Plan:  Mrs. Cosner is a very pleasant 69 year old woman who presents to establish care for chest wall pain.  Pain is most consistent with intercostal neuralgia -Gabapentin did not provide relief. Will try Amitriptyline 10mg  HS.  -Discussed possible side effects to look out for. -Continue water aerobics for health maintenance and pain relief through endorphin release.  -Commended activity modification and use of loose bras.  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  All questions answered. Can return to clinic in 1 month.

## 2019-10-16 ENCOUNTER — Encounter: Payer: Self-pay | Admitting: Physical Medicine and Rehabilitation

## 2019-10-29 ENCOUNTER — Other Ambulatory Visit: Payer: Self-pay | Admitting: Physical Medicine and Rehabilitation

## 2019-10-29 MED ORDER — AMITRIPTYLINE HCL 25 MG PO TABS: 25.0000 mg | ORAL_TABLET | Freq: Every day | ORAL | 3 refills | Status: DC

## 2019-11-15 ENCOUNTER — Ambulatory Visit: Payer: Medicare PPO | Admitting: Physical Medicine and Rehabilitation

## 2019-11-18 ENCOUNTER — Other Ambulatory Visit: Payer: Self-pay

## 2019-11-18 ENCOUNTER — Encounter: Payer: Medicare PPO | Attending: Physical Medicine and Rehabilitation | Admitting: Physical Medicine and Rehabilitation

## 2019-11-18 ENCOUNTER — Encounter: Payer: Self-pay | Admitting: Physical Medicine and Rehabilitation

## 2019-11-18 VITALS — BP 120/80 | HR 64 | Temp 97.7°F | Ht 67.0 in | Wt 142.0 lb

## 2019-11-18 DIAGNOSIS — G588 Other specified mononeuropathies: Secondary | ICD-10-CM

## 2019-11-18 MED ORDER — PREGABALIN 25 MG PO CAPS: 25.0000 mg | ORAL_CAPSULE | Freq: Every day | ORAL | 3 refills | Status: DC

## 2019-11-18 NOTE — Progress Notes (Signed)
Subjective:    Patient ID: Margaret Charles, female    DOB: 05/28/1951, 69 y.o.   MRN: [id number]  HPI  Margaret Charles is a very pleasant 69 year old woman who presents for follow-up of intercostal neuralgia.    She has been having 2 years of pain since undergoing lobectomy for lung cancer. She has been able to tolerate the pain well and continue with the activities that she loves. It feels like a dull ache under her right breast and radiates to the right side of her back under her scapula. She wears loose bras to reduce pressure in this area. Often at rest she does not notice it but when she does a sudden movement she experience sharp pain in this area that lasts for 30-45 seconds. She states that if the pain never resolves she would be ok--it does not significantly bother her. She loves to do water aerobics and initially felt pressure from the flotation device on this area, but modified her exercise with a foam tube and this has worked well for her. I initially prescribed 300mg  TID with possibly a small amount of benefit, but stable pain ratings. The medication did make her a little sleepy, so we stopped it and tried Amitriptyline. This made her too sleepy and constipated so she stopped it, but it was helping with the pain.  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 Water aerobics? She has returned to this too. She does feel pain relief while exercising, but then feels increased pain later in the day.   Pain Inventory Average Pain 6 Pain Right Now 4 My pain is intermittent, sharp, burning, stabbing, tingling and aching  In the last 24 hours, has pain interfered with the following? General activity 3 Relation with others 0 Enjoyment of life 3 What TIME of day is your pain at its worst? 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  no  Physicians involved in your care Any changes since last visit?  no   Family History  Problem Relation Age of Onset  . Dementia Mother   . Heart attack Mother        age 58. after hip fracture.   . Hypertension Mother   . Prostate cancer Father   . Hypertension Father   . Breast cancer Sister   . Hypertension Sister   . Heart attack Sister        15. former smoker  . Heart attack Paternal Grandfather   . Hypertension Sister   . Colon cancer Other        family History  . Colon polyps Other        family history   Social History   Socioeconomic History  . Marital status: Single    Spouse name: Not on file  . Number of children: Not on file  . Years of education: Not on file  . Highest education level: Not on file  Occupational History  . Occupation: Retired Pharmacist, hospital  Tobacco Use  . Smoking status: Former Smoker    Packs/day: 1.00    Years: 40.00    Pack years: 40.00    Types: Cigarettes    Quit date: 12/03/2008    Years since quitting: 10.9  .  Smokeless tobacco: Never Used  Substance and Sexual Activity  . Alcohol use: Not Currently    Comment: hx  etoh  38 yrs  . Drug use: Never  . Sexual activity: Not Currently  Other Topics Concern  . Not on file  Social History Narrative   Single. Lives alone.    Lived in [place] for some years to help with parents but otherwise in [place] most of life.       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 Difficulty of Paying Living Expenses:   Food Insecurity:   . Worried About Charity fundraiser in the Last Year:   . Arboriculturist in the  Last Year:   Transportation Needs:   . Film/video editor (Medical):   Marland Kitchen Lack of Transportation (Non-Medical):   Physical Activity:   . Days of Exercise per Week:   . Minutes of Exercise per Session:   Stress:   . Feeling of Stress :   Social Connections:   . Frequency of Communication with Friends and Family:   . Frequency of Social Gatherings with Friends and Family:   . Attends Religious Services:   . Active Member of Clubs or Organizations:   . Attends Archivist Meetings:   Marland Kitchen Marital Status:    Past Surgical History:  Procedure Laterality Date  . BLADDER SURGERY  1985   Long term incontinence. [facility]- used small intestine to build bladder  . BREAST BIOPSY  2001  . CATARACT EXTRACTION W/ INTRAOCULAR LENS  IMPLANT, BILATERAL    . Intestinal blockage  1989 and 1990   bowel obstruction surgery  . Lung cancer removal   08/2017   Right lower lobe  . VIDEO ASSISTED THORACOSCOPY (VATS)/WEDGE RESECTION Right 09/16/2017   Procedure: VIDEO ASSISTED THORACOSCOPY (VATS)/LUNG RESECTION;  Surgeon: [name], MD;  Location: [place];  Service: Thoracic;  Laterality: Right;  Marland Kitchen VIDEO BRONCHOSCOPY N/A 09/16/2017   Procedure: VIDEO BRONCHOSCOPY;  Surgeon: [name], MD;  Location: [facility] OR;  Service: Thoracic;  Laterality: N/A;  . WRIST SURGERY     de quervains tenosynovitis. Dr. Maureen Ralphs actually did this.    Past Medical History:  Diagnosis Date  . Arthritis    ddd  . Chronic headaches    imprved recently per patient   . Emphysema of lung ([place])    asymptomatic. noted on CT  . Family history of colon cancer   . History of adenomatous polyp of colon    2015 precancerous polyp history- due spring 2020.  5 years  . History of seizures    1980s few ??? seizure activity  (vagal response)  . Hypertension   . Lung cancer ([place]) dx'd 07/2017  . Palpitations   . RBBB (right bundle branch block)    palpitations  . Skin cancer    skin basal cell cancer   BP 120/80    Pulse 64   Temp 97.7 F (36.5 C)   Ht 5\' 7"  (1.702 m)   Wt 142 lb (64.4 kg)   SpO2 98%   BMI 22.24 kg/m   Opioid Risk Score:   Fall Risk Score:  `1  Depression screen PHQ 2/9  Depression screen [facility] 2/9 11/18/2019 10/15/2019 09/14/2019  03/26/2019 02/18/2019 08/27/2018 08/13/2018  Decreased Interest 0 0 0 0 0 0 0  Down, Depressed, Hopeless 0 0 0 0 0 0 0  PHQ - 2 Score 0 0 0 0 0 0 0    Review of Systems  Constitutional: Negative.   HENT: Negative.   Eyes: Negative.   Respiratory: Negative.   Cardiovascular: Negative.   Gastrointestinal: Negative.   Genitourinary: Negative.   Musculoskeletal: Negative.   Skin: Negative.   Allergic/Immunologic: Negative.   Neurological: Negative.   Hematological: Negative.   Psychiatric/Behavioral: Negative.   All other systems reviewed and are negative.      Objective:   Physical Exam Gen: no distress, normal appearing HEENT: oral mucosa pink and moist, NCAT Cardio: Reg rate Chest: normal effort, normal rate of breathing Abd: soft, non-distended Ext: no edema Skin: intact Neuro: AOX3. Musculoskeletal: No erythema or bruising under bra line or along back. Mild tenderness to palpation under bra line on right side, continuing to under right scapula. FROM of upper extremities.  Psych: pleasant, normal affect    Assessment & Plan:  Margaret Charles is a very pleasant 69 year old woman who presents for follow-up of intercostal neuralgia.    Pain is most consistent with intercostal neuralgia -Gabapentin did not provide relief. Did not tolerate Amitriptyline.   -Start Lyrica 25mg  daily. Advised regarding side effects. Discussed possible side effects to look out for. -Continue water aerobics for health maintenance and pain relief through endorphin release.  -Commended activity modification and use of loose bras.  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   All questions answered. Can  return to clinic in 1 month.

## 2019-11-30 ENCOUNTER — Other Ambulatory Visit: Payer: Self-pay | Admitting: Physical Medicine and Rehabilitation

## 2019-11-30 MED ORDER — PREGABALIN 25 MG PO CAPS: 25.0000 mg | ORAL_CAPSULE | Freq: Three times a day (TID) | ORAL | 3 refills | Status: DC

## 2019-12-06 ENCOUNTER — Inpatient Hospital Stay: Payer: Medicare PPO | Attending: Internal Medicine

## 2019-12-06 ENCOUNTER — Ambulatory Visit (HOSPITAL_COMMUNITY)
Admission: RE | Admit: 2019-12-06 | Discharge: 2019-12-06 | Disposition: A | Discharge status: Home / Self Care | Payer: Medicare PPO | Source: Ambulatory Visit | Attending: Physician Assistant | Admitting: Physician Assistant

## 2019-12-06 ENCOUNTER — Other Ambulatory Visit: Payer: Self-pay

## 2019-12-06 DIAGNOSIS — C3431 Malignant neoplasm of lower lobe, right bronchus or lung: Secondary | ICD-10-CM | POA: Diagnosis not present

## 2019-12-06 DIAGNOSIS — C3490 Malignant neoplasm of unspecified part of unspecified bronchus or lung: Secondary | ICD-10-CM | POA: Diagnosis not present

## 2019-12-06 LAB — CMP (CANCER CENTER ONLY)
ALT: 21 U/L (ref 0–44)
AST: 21 U/L (ref 15–41)
Albumin: 3.9 g/dL (ref 3.5–5.0)
Alkaline Phosphatase: 108 U/L (ref 38–126)
Anion gap: 9 (ref 5–15)
BUN: 12 mg/dL (ref 8–23)
CO2: 25 mmol/L (ref 22–32)
Calcium: 9.5 mg/dL (ref 8.9–10.3)
Chloride: 106 mmol/L (ref 98–111)
Creatinine: 0.79 mg/dL (ref 0.44–1.00)
GFR, Est AFR Am: >60 mL/min (ref >60)
GFR, Estimated: >60 mL/min (ref >60)
Glucose, Bld: 92 mg/dL (ref 70–99)
Potassium: 4.8 mmol/L (ref 3.5–5.1)
Sodium: 140 mmol/L (ref 135–145)
Total Bilirubin: 0.5 mg/dL (ref 0.3–1.2)
Total Protein: 7 g/dL (ref 6.5–8.1)

## 2019-12-06 LAB — CBC WITH DIFFERENTIAL (CANCER CENTER ONLY)
Abs Immature Granulocytes: 0.02 10*3/uL (ref 0.00–0.07)
Basophils Absolute: 0.1 10*3/uL (ref 0.0–0.1)
Basophils Relative: 1 %
Eosinophils Absolute: 0.2 10*3/uL (ref 0.0–0.5)
Eosinophils Relative: 2 %
HCT: 39.2 % (ref 36.0–46.0)
Hemoglobin: 13 g/dL (ref 12.0–15.0)
Immature Granulocytes: 0 %
Lymphocytes Relative: 29 %
Lymphs Abs: 3.2 10*3/uL (ref 0.7–4.0)
MCH: 30.4 pg (ref 26.0–34.0)
MCHC: 33.2 g/dL (ref 30.0–36.0)
MCV: 91.6 fL (ref 80.0–100.0)
Monocytes Absolute: 0.8 10*3/uL (ref 0.1–1.0)
Monocytes Relative: 7 %
Neutro Abs: 6.7 10*3/uL (ref 1.7–7.7)
Neutrophils Relative %: 61 %
Platelet Count: 267 10*3/uL (ref 150–400)
RBC: 4.28 MIL/uL (ref 3.87–5.11)
RDW: 12.8 % (ref 11.5–15.5)
WBC Count: 11 10*3/uL — ABNORMAL HIGH (ref 4.0–10.5)
nRBC: 0 % (ref 0.0–0.2)

## 2019-12-06 MED ORDER — SODIUM CHLORIDE (PF) 0.9 % IJ SOLN
INTRAMUSCULAR | Status: AC
  Filled 2019-12-06: qty 50

## 2019-12-06 MED ORDER — IOHEXOL 300 MG/ML  SOLN
75.0000 mL | Freq: Once | INTRAMUSCULAR | Status: AC | PRN
  Administered 2019-12-06: 75 mL via INTRAVENOUS

## 2019-12-08 ENCOUNTER — Encounter: Payer: Self-pay | Admitting: Internal Medicine

## 2019-12-08 ENCOUNTER — Other Ambulatory Visit: Payer: Self-pay

## 2019-12-08 ENCOUNTER — Inpatient Hospital Stay: Payer: Medicare PPO | Admitting: Internal Medicine

## 2019-12-08 VITALS — BP 123/73 | HR 60 | Temp 98.1°F | Resp 18 | Ht 67.0 in | Wt 142.5 lb

## 2019-12-08 DIAGNOSIS — C3490 Malignant neoplasm of unspecified part of unspecified bronchus or lung: Secondary | ICD-10-CM | POA: Diagnosis not present

## 2019-12-08 DIAGNOSIS — C3431 Malignant neoplasm of lower lobe, right bronchus or lung: Secondary | ICD-10-CM | POA: Diagnosis not present

## 2019-12-08 NOTE — Progress Notes (Signed)
[place] Telephone:(336) [phone]   Fax:(336) [phone]  OFFICE PROGRESS NOTE  [name], MD [place] [address]  DIAGNOSIS: Stage IA (T1b, N0, M0) non-small cell lung cancer, squamous cell carcinoma diagnosed in March 2019.  PRIOR THERAPY:  Status post right lower lobectomy with lymph node dissection under the care of Dr. Servando Snare on September 16, 2017.  CURRENT THERAPY: Observation.  INTERVAL HISTORY: Margaret Charles 69 y.o. female returns to the clinic today for follow-up visit.  The patient is feeling fine today with no concerning complaints except for intermittent pain in the right rib cage.  She is seen by Dr. Ranell Patrick and she is managing her pain issues.  She denied having any shortness of breath, cough or hemoptysis.  She denied having any fever or chills.  She has no nausea, vomiting, diarrhea or constipation.  She denied having any weight loss or night sweats.  The patient had repeat CT scan of the chest performed recently and she is here for evaluation and discussion of her risk her results.   MEDICAL HISTORY: Past Medical History:  Diagnosis Date  . Arthritis    ddd  . Chronic headaches    imprved recently per patient   . Emphysema of lung ([place])    asymptomatic. noted on CT  . Family history of colon cancer   . History of adenomatous polyp of colon    2015 precancerous polyp history- due spring 2020.  5 years  . History of seizures    1980s few ??? seizure activity  (vagal response)  . Hypertension   . Lung cancer ([place]) dx'd 07/2017  . Palpitations   . RBBB (right bundle branch block)    palpitations  . Skin cancer    skin basal cell cancer    ALLERGIES:  is allergic to codeine and quinolones.  MEDICATIONS:  Current Outpatient Medications  Medication Sig Dispense Refill  . acetaminophen (TYLENOL) 325 MG tablet Take 650 mg by mouth every 6 (six) hours as needed for mild pain.    . fluticasone (FLONASE) 50 MCG/ACT  nasal spray Place 1 spray into both nostrils daily.    Marland Kitchen levothyroxine (SYNTHROID) 75 MCG tablet Take 1 tablet (75 mcg total) by mouth daily. 90 tablet 3  . metoprolol tartrate (LOPRESSOR) 25 MG tablet TAKE 1 TABLET BY MOUTH TWICE A DAY PLEASE MAKE YEARLY APPT 180 tablet 3  . Multiple Vitamins-Minerals (CENTRUM ADULTS PO) Take 1 tablet by mouth daily.    Marland Kitchen Propylene Glycol-Glycerin (SOOTHE OP) Place 1 drop into both eyes 2 (two) times daily as needed (dry eyes).     No current facility-administered medications for this visit.    SURGICAL HISTORY:  Past Surgical History:  Procedure Laterality Date  . BLADDER SURGERY  1985   Long term incontinence. [facility]- used small intestine to build bladder  . BREAST BIOPSY  2001  . CATARACT EXTRACTION W/ INTRAOCULAR LENS  IMPLANT, BILATERAL    . Intestinal blockage  1989 and 1990   bowel obstruction surgery  . Lung cancer removal   08/2017   Right lower lobe  . VIDEO ASSISTED THORACOSCOPY (VATS)/WEDGE RESECTION Right 09/16/2017   Procedure: VIDEO ASSISTED THORACOSCOPY (VATS)/LUNG RESECTION;  Surgeon: [name], MD;  Location: [place];  Service: Thoracic;  Laterality: Right;  Marland Kitchen VIDEO BRONCHOSCOPY N/A 09/16/2017   Procedure: VIDEO BRONCHOSCOPY;  Surgeon: [name], MD;  Location: [place];  Service: Thoracic;  Laterality: N/A;  .  WRIST SURGERY     de quervains tenosynovitis. Dr. Maureen Ralphs actually did this.     REVIEW OF SYSTEMS:  A comprehensive review of systems was negative except for: Respiratory: positive for pleurisy/chest pain   PHYSICAL EXAMINATION: General appearance: alert, cooperative and no distress Head: Normocephalic, without obvious abnormality, atraumatic Neck: no adenopathy, no JVD, supple, symmetrical, trachea midline and thyroid not enlarged, symmetric, no tenderness/mass/nodules Lymph nodes: Cervical, supraclavicular, and axillary nodes normal. Resp: clear to auscultation bilaterally Back: symmetric, no curvature. ROM  normal. No CVA tenderness. Cardio: regular rate and rhythm, S1, S2 normal, no murmur, click, rub or gallop GI: soft, non-tender; bowel sounds normal; no masses,  no organomegaly Extremities: extremities normal, atraumatic, no cyanosis or edema  ECOG PERFORMANCE STATUS: 1 - Symptomatic but completely ambulatory  Blood pressure 123/73, pulse 60, temperature 98.1 F (36.7 C), temperature source Temporal, resp. rate 18, height 5\' 7"  (1.702 m), weight 142 lb 8 oz (64.6 kg), SpO2 100 %.  LABORATORY DATA: Lab Results  Component Value Date   WBC 11.0 (H) 12/06/2019   HGB 13.0 12/06/2019   HCT 39.2 12/06/2019   MCV 91.6 12/06/2019   PLT 267 12/06/2019      Chemistry      Component Value Date/Time   NA 140 12/06/2019 1307   K 4.8 12/06/2019 1307   CL 106 12/06/2019 1307   CO2 25 12/06/2019 1307   BUN 12 12/06/2019 1307   CREATININE 0.79 12/06/2019 1307      Component Value Date/Time   CALCIUM 9.5 12/06/2019 1307   ALKPHOS 108 12/06/2019 1307   AST 21 12/06/2019 1307   ALT 21 12/06/2019 1307   BILITOT 0.5 12/06/2019 1307       RADIOGRAPHIC STUDIES: CT Chest W Contrast  Result Date: 12/06/2019 CLINICAL DATA:  Non-small-cell lung cancer, stage I A. Squamous cell carcinoma. Status post right lower lobectomy and lymph node dissection. On observation. EXAM: CT CHEST WITH CONTRAST TECHNIQUE: Multidetector CT imaging of the chest was performed during intravenous contrast administration. CONTRAST:  5mL OMNIPAQUE IOHEXOL 300 MG/ML  SOLN COMPARISON:  05/21/2019 FINDINGS: Cardiovascular: Aortic atherosclerosis. Tortuous thoracic aorta. Borderline to mild ascending aortic dilatation, including at 4.1 cm on 60/2, similar. Normal heart size, without pericardial effusion. Multivessel coronary artery atherosclerosis. Mediastinum/Nodes: Small middle mediastinal nodes are unchanged. None pathologic by size criteria. No hilar adenopathy. Fluid level in the esophagus on 75/2. Lungs/Pleura: Minimal  loculated right-sided pleural fluid inferiorly and medially, similar. Right lower lobectomy. Mild centrilobular emphysema. Upper Abdomen: Normal imaged portions of the liver, spleen, stomach, pancreas, adrenal glands, kidneys. Musculoskeletal: Sclerotic lesion within the left inferior portion of the T12 vertebral body is unchanged, likely a bone island at 7 mm on 128/2. The vague sclerotic lesion within the T3 vertebral body is less distinct than on the prior, including on 80/6. IMPRESSION: 1. Right lower lobectomy. No evidence of soft tissue metastasis or recurrent disease. 2. The previously described area of sclerosis within the T3 vertebral body is less distinct/apparent today. Combined with lack of hypermetabolism on interval PET, this is felt unlikely to represent osseous metastasis. 3. Similar trace, loculated right-sided pleural fluid. 4. Aortic atherosclerosis (ICD10-I70.0), coronary artery atherosclerosis and emphysema (ICD10-J43.9). 5. Ascending aortic dilatation, on the order of 4.1 cm. Recommend annual imaging followup by CTA or MRA. This recommendation follows 2010 ACCF/AHA/AATS/ACR/ASA/SCA/SCAI/SIR/STS/SVM Guidelines for the Diagnosis and Management of Patients with Thoracic Aortic Disease. Circulation. 2010; 121: R604-V409. Aortic aneurysm NOS (ICD10-I71.9) 6. Esophageal air fluid level suggests dysmotility or gastroesophageal  reflux. Electronically Signed   By: Abigail Miyamoto M.D.   On: 12/06/2019 14:41    ASSESSMENT AND PLAN: This is a very pleasant 69 years old white female with a stage Ia non-small cell lung cancer status post right lower lobectomy with lymph node dissection in March 2019 under the care of Dr. Servando Snare.   The patient is currently on observation and she is feeling fine today with no concerning issues. She had repeat CT scan of the chest performed recently.  I personally and independently reviewed the scans and discussed the results with the patient today.  Her scan showed no  concerning findings for disease recurrence or metastasis. I recommended for the patient to continue on observation with repeat CT scan of the chest in 1 year. She was advised to call immediately if she has any concerning symptoms in the interval. The patient voices understanding of current disease status and treatment options and is in agreement with the current care plan.  All questions were answered. The patient knows to call the clinic with any problems, questions or concerns. We can certainly see the patient much sooner if necessary.   Disclaimer: This note was dictated with voice recognition software. Similar sounding words can inadvertently be transcribed and may not be corrected upon review.

## 2019-12-10 ENCOUNTER — Telehealth: Payer: Self-pay | Admitting: Internal Medicine

## 2019-12-10 NOTE — Telephone Encounter (Signed)
Scheduled per los. Mailed printout  °

## 2019-12-21 ENCOUNTER — Encounter: Payer: Medicare PPO | Admitting: Physical Medicine and Rehabilitation

## 2020-02-17 ENCOUNTER — Ambulatory Visit: Payer: Medicare PPO | Admitting: Cardiothoracic Surgery

## 2020-02-17 ENCOUNTER — Other Ambulatory Visit: Payer: Self-pay

## 2020-02-17 VITALS — BP 129/79 | HR 63 | Temp 96.8°F | Resp 20 | Ht 67.0 in | Wt 144.0 lb

## 2020-02-17 DIAGNOSIS — I712 Thoracic aortic aneurysm, without rupture: Secondary | ICD-10-CM | POA: Diagnosis not present

## 2020-02-17 DIAGNOSIS — Z902 Acquired absence of lung [part of]: Secondary | ICD-10-CM

## 2020-02-17 DIAGNOSIS — Z85118 Personal history of other malignant neoplasm of bronchus and lung: Secondary | ICD-10-CM | POA: Diagnosis not present

## 2020-02-17 DIAGNOSIS — I7121 Aneurysm of the ascending aorta, without rupture: Secondary | ICD-10-CM

## 2020-02-17 NOTE — Progress Notes (Signed)
[address]       [place] [address]             [phone]                  [place] Medical Record #[id number] Date of Birth: 1951/03/16  Referring [id number], Brayton Mars, MD Primary Cardiology: Primary Care:Hunter, Brayton Mars, MD  Chief Complaint:  Follow Up Visit 09/16/2017  OPERATIVE REPORT PREOPERATIVE DIAGNOSIS:  Right lower lobe lung mass. POSTOPERATIVE DIAGNOSES: 1. Right lower lobe lung mass. 2. Non-small cell carcinoma of the lung. PROCEDURES PERFORMED: 1. Bronchoscopy. 2. Right video-assisted thoracoscopy. 3. Wedge resection, right lower lobe. 4. Completion right lower lobectomy with lymph node dissection. 5. Placement of On-Q. SURGEON:  Lanelle Bal, MD   Cancer Staging Lung cancer, Right lower lobe [facility]) Staging form: Lung, AJCC 8th Edition - Clinical: No stage assigned - Unsigned - Pathologic stage from 09/19/2017: Stage IA2 (pT1b, pN0, cM0) - Signed by [name], MD on 09/19/2017   History of Present Illness:      Patient  returns  70  months after resection of stage I a right lower lobe non-small cell lung cancer-  in March 2019.  She denies any pulmonary symptoms .  She had a follow-up CT scan of the chest and echocardiogram in the spring 2020.  on her scans she has a mildly dilated ascending aorta, with no associated murmur of aortic insufficiency.  Echocardiogram could not definitively say if she had a bicuspid or trileaflet aortic valve.  He tells me that her sister who lives out of the [place] is also being followed for a dilated ascending aorta, though the patient has no specific details about this.  Patient notes that she later remembered some of her mother's medical history, mother died at age 18 suddenly, had been diagnosed with dementia and was in the hospital after a fall.  Patient remembers she was told that her mother had aneurysm in her chest-but no further evaluation or investigation was  done.  Patient continues to have neuropathic discomfort over the right lower chest, sometimes this is asymptomatic but other times when she moves in certain positions and has increasing discomfort.  Through pain clinic she tried several medications but the side effects of all of them were worsening her symptoms so she stopped.   Zubrod Score: At the time of surgery this patient's most appropriate activity status/level should be described as: [x]     0    Normal activity, no symptoms []     1    Restricted in physical strenuous activity but ambulatory, able to do out light work []     2    Ambulatory and capable of self care, unable to do work activities, up and about                 >50 % of waking hours                                                                                   []     3    Only limited self care, in bed greater than  50% of waking hours []     4    Completely disabled, no self care, confined to bed or chair []     5    Moribund  Social History   Tobacco Use  Smoking Status Former Smoker  . Packs/day: 1.00  . Years: 40.00  . Pack years: 40.00  . Types: Cigarettes  . Quit date: 12/03/2008  . Years since quitting: 11.2  Smokeless Tobacco Never Used       Allergies  Allergen Reactions  . Codeine Nausea And Vomiting  . Quinolones     Patient was warned about not using Cipro and similar antibiotics. Recent studies have raised concern that fluoroquinolone antibiotics could be associated with an increased risk of aortic aneurysm Fluoroquinolones have non-antimicrobial properties that might jeopardise the integrity of the extracellular matrix of the vascular wall In a  propensity score matched cohort study in [place], there was a 66% increased rate of aortic aneurysm or dissection associated with oral fluoroquinolone use, compared wit    Current Outpatient Medications  Medication Sig Dispense Refill  . acetaminophen (TYLENOL) 325 MG tablet Take 650 mg by mouth every 6  (six) hours as needed for mild pain.    . fluticasone (FLONASE) 50 MCG/ACT nasal spray Place 1 spray into both nostrils daily.    Marland Kitchen levothyroxine (SYNTHROID) 75 MCG tablet Take 1 tablet (75 mcg total) by mouth daily. 90 tablet 3  . metoprolol tartrate (LOPRESSOR) 25 MG tablet TAKE 1 TABLET BY MOUTH TWICE A DAY PLEASE MAKE YEARLY APPT 180 tablet 3  . Multiple Vitamins-Minerals (CENTRUM ADULTS PO) Take 1 tablet by mouth daily.    Marland Kitchen Propylene Glycol-Glycerin (SOOTHE OP) Place 1 drop into both eyes 2 (two) times daily as needed (dry eyes).     No current facility-administered medications for this visit.       Physical Exam: BP 129/79 (BP Location: Left Arm, Patient Position: Sitting, Cuff Size: Normal)   Pulse 63   Temp (!) 96.8 F (36 C) (Temporal)   Resp 20   Ht 5\' 7"  (1.702 m)   Wt 144 lb (65.3 kg)   SpO2 98% Comment: RA  BMI 22.55 kg/m  General appearance: alert and cooperative Head: Normocephalic, without obvious abnormality, atraumatic Neck: no adenopathy, no carotid bruit, no JVD, supple, symmetrical, trachea midline and thyroid not enlarged, symmetric, no tenderness/mass/nodules Lymph nodes: Cervical, supraclavicular, and axillary nodes normal. Resp: clear to auscultation bilaterally Cardio: regular rate and rhythm, S1, S2 normal, no murmur, click, rub or gallop Extremities: extremities normal, atraumatic, no cyanosis or edema Neurologic: Grossly normal  Diagnostic Studies & Laboratory data:         Recent Radiology Findings: EXAM: CT CHEST WITH CONTRAST  TECHNIQUE: Multidetector CT imaging of the chest was performed during intravenous contrast administration.  CONTRAST:  24mL OMNIPAQUE IOHEXOL 300 MG/ML  SOLN  COMPARISON:  05/21/2019  FINDINGS: Cardiovascular: Aortic atherosclerosis. Tortuous thoracic aorta. Borderline to mild ascending aortic dilatation, including at 4.1 cm on 60/2, similar. Normal heart size, without pericardial effusion. Multivessel  coronary artery atherosclerosis.  Mediastinum/Nodes: Small middle mediastinal nodes are unchanged. None pathologic by size criteria. No hilar adenopathy.  Fluid level in the esophagus on 75/2.  Lungs/Pleura: Minimal loculated right-sided pleural fluid inferiorly and medially, similar. Right lower lobectomy. Mild centrilobular emphysema.  Upper Abdomen: Normal imaged portions of the liver, spleen, stomach, pancreas, adrenal glands, kidneys.  Musculoskeletal: Sclerotic lesion within the left inferior portion of the T12 vertebral body is unchanged, likely a bone island  at 7 mm on 128/2. The vague sclerotic lesion within the T3 vertebral body is less distinct than on the prior, including on 80/6.  IMPRESSION: 1. Right lower lobectomy. No evidence of soft tissue metastasis or recurrent disease. 2. The previously described area of sclerosis within the T3 vertebral body is less distinct/apparent today. Combined with lack of hypermetabolism on interval PET, this is felt unlikely to represent osseous metastasis. 3. Similar trace, loculated right-sided pleural fluid. 4. Aortic atherosclerosis (ICD10-I70.0), coronary artery atherosclerosis and emphysema (ICD10-J43.9). 5. Ascending aortic dilatation, on the order of 4.1 cm. Recommend annual imaging followup by CTA or MRA. This recommendation follows 2010 ACCF/AHA/AATS/ACR/ASA/SCA/SCAI/SIR/STS/SVM Guidelines for the Diagnosis and Management of Patients with Thoracic Aortic Disease. Circulation. 2010; 121: V893-Y101. Aortic aneurysm NOS (ICD10-I71.9) 6. Esophageal air fluid level suggests dysmotility or gastroesophageal reflux.   Electronically Signed   By: Abigail Miyamoto M.D.   On: 12/06/2019 14:41      CT and PET scan done in the fall 2020 were reviewed no evidence of malignancy-no change in the size of the aorta-   CLINICAL DATA:  RIGHT lung cancer diagnosed February 2019. RIGHT lower lobectomy.  EXAM: CT CHEST WITH  CONTRAST  TECHNIQUE: Multidetector CT imaging of the chest was performed during intravenous contrast administration.  CONTRAST:  39mL OMNIPAQUE IOHEXOL 300 MG/ML  SOLN  COMPARISON:  PET scan 08/25/2017  FINDINGS: Cardiovascular: 4 cm ascending aorta. No significant vascular findings. Normal heart size. No pericardial effusion.  Mediastinum/Nodes: No axillary or supraclavicular adenopathy. No mediastinal adenopathy. Esophagus normal  Lungs/Pleura: RIGHT lower lobectomy. Small moderate RIGHT effusion. No nodularity in the RIGHT lung. Several benign-appearing nodules are present along the superior aspect of the RIGHT oblique fissure.  The LEFT lung is clear.  Upper Abdomen: Limited view of the liver, kidneys, pancreas are unremarkable. Normal adrenal glands.  Musculoskeletal: No aggressive osseous lesion  IMPRESSION: 1. No evidence of RIGHT lung cancer recurrence following RIGHT lower lobectomy. 2. RIGHT lower lobe effusion is new from prior. 3. No evidence of metastatic adenopathy.   Electronically Signed   By: Suzy Bouchard M.D.   On: 05/04/2018 17:17  Patient does have a dilated ascending aorta 4.2 cm, appears stable when compared to her previous PET scan.  CLINICAL DATA:  Right lung cancer.  Right lateral chest pain.  EXAM: CT CHEST WITH CONTRAST  TECHNIQUE: Multidetector CT imaging of the chest was performed during intravenous contrast administration.  CONTRAST:  73mL OMNIPAQUE IOHEXOL 300 MG/ML  SOLN  COMPARISON:  05/04/2018.  FINDINGS: Cardiovascular: Atherosclerotic calcification of the aorta and coronary arteries. Heart size normal. No pericardial effusion.  Mediastinum/Nodes: No pathologically enlarged mediastinal, hilar or axillary lymph nodes. Esophagus is grossly unremarkable.  Lungs/Pleura: Centrilobular emphysema. Right lower lobectomy. Small partially loculated pleural effusion at the base of the right hemithorax,  stable. Left lung is clear. Airway is otherwise unremarkable.  Upper Abdomen: Visualized portions of the liver, adrenal glands, kidneys, spleen, pancreas, stomach and bowel are grossly unremarkable. No upper abdominal adenopathy.  Musculoskeletal: Degenerative changes in the spine.  IMPRESSION: 1. No evidence of recurrent or metastatic disease. 2. Small, partially loculated right pleural effusion, stable. 3.  Aortic atherosclerosis (ICD10-170.0). 4.  Emphysema (ICD10-J43.9).   Electronically Signed   By: Lorin Picket M.D.   On: 11/17/2018 13:45  The CT report does not mention the size of the aorta on my measurement it ranges between 3.9 - 4.2 cm unchanged from previous scan.   Result status: Final result  ECHOCARDIOGRAM REPORT       Patient Name:   CEDRA VILLALON Date of Exam: 09/02/2018 Medical Rec #:  [id number]      Height:       67.0 in Accession #:    [id number]     Weight:       146.4 lb Date of Birth:  11/17/1950      BSA:          1.77 m Patient Age:    58 years       BP:           124/70 mmHg Patient Gender: F              HR:           NA bpm. Exam Location:  [place]    Procedure: 2D Echo, Cardiac Doppler and Color Doppler  Indications:    I11.9 Hypertensive Heart Disease                 I77.810 Ascending Aortic Dilation   History:        Patient has no prior history of Echocardiogram examinations.                 RBBB; Risk Factors: Hypertension, Dyslipidemia, Family History                 of Coronary Artery Disease and Former Smoker. Right Lung Cancer                 status post Right lower lobectomy, Seizures, Palpitations.   Sonographer:    Deliah Boston RDCS Referring Phys: [phone] PHILIP J NAHSER    Sonographer Comments: Unable to aquire EKG, leads broken IMPRESSIONS    1. The left ventricle has normal systolic function, with an ejection fraction of 55-60%. The cavity size was normal. Left ventricular diastolic Doppler  parameters are consistent with impaired relaxation. Indeterminate filling pressures.  2. The right ventricle has normal systolic function. The cavity was normal. There is no increase in right ventricular wall thickness.  3. The aortic valve was not assessed Aortic valve regurgitation is trivial by color flow Doppler.  FINDINGS  Left Ventricle: The left ventricle has normal systolic function, with an ejection fraction of 55-60%. The cavity size was normal. There is no increase in left ventricular wall thickness. Left ventricular diastolic Doppler parameters are consistent with  impaired relaxation. Indeterminate filling pressures Right Ventricle: The right ventricle has normal systolic function. The cavity was normal. There is no increase in right ventricular wall thickness. Left Atrium: left atrial size was normal in size Right Atrium: right atrial size was normal in size. Right atrial pressure is estimated at 3 mmHg. Interatrial Septum: No atrial level shunt detected by color flow Doppler. Pericardium: There is no evidence of pericardial effusion. Mitral Valve: The mitral valve is normal in structure. Mitral valve regurgitation is trivial by color flow Doppler. Tricuspid Valve: The tricuspid valve is normal in structure. Tricuspid valve regurgitation is trivial by color flow Doppler. Aortic Valve: The aortic valve was not assessed Aortic valve regurgitation is trivial by color flow Doppler. Pulmonic Valve: The pulmonic valve was normal in structure. Pulmonic valve regurgitation was not assessed by color flow Doppler. Venous: The inferior vena cava is normal in size with greater than 50% respiratory variability.   LEFT VENTRICLE PLAX 2D LVIDd:         3.82 cm  Diastology LVIDs:         2.83 cm  LV e' lateral:   9.46 cm/s LV PW:         1.18 cm  LV E/e' lateral: 7.1 LV IVS:        0.82 cm  LV e' medial:    5.66 cm/s LVOT diam:     2.40 cm  LV E/e' medial:  11.9 LV SV:         32 ml LV SV  Index:   18.25 LVOT Area:     4.52 cm  RIGHT VENTRICLE RV S prime:     10.30 cm/s TAPSE (M-mode): 1.3 cm RVSP:           21.8 mmHg  LEFT ATRIUM           Index       RIGHT ATRIUM           Index LA diam:      3.20 cm 1.81 cm/m  RA Pressure: 3 mmHg LA Vol (A4C): 25.6 ml 14.45 ml/m RA Area:     14.20 cm                                   RA Volume:   35.70 ml  20.16 ml/m  AORTIC VALVE LVOT Vmax:   147.00 cm/s LVOT Vmean:  78.300 cm/s LVOT VTI:    0.217 m   AORTA Ao Root diam: 3.70 cm Ao Asc diam:  3.00 cm  MITRAL VALVE              TRICUSPID VALVE MV Area (PHT): cm        TR Peak grad:   18.8 mmHg MV PHT:        msec       TR Vmax:        217.00 cm/s MV Decel Time: 232 msec   RVSP:           21.8 mmHg MV E velocity: 67.40 cm/s MV A velocity: 87.30 cm/s SHUNTS MV E/A ratio:  0.77       Systemic VTI:  0.22 m                           Systemic Diam: 2.40 cm    Skeet Latch MD Electronically signed by Skeet Latch MD Signature Date/Time: 09/02/2018/9:56:40 PM  Notes recorded by Thayer Headings, MD on 09/07/2018 at 5:54 PM EDT  I have personally reviewed the echo images and the aortic valve was not seen well enough to say whether its a regular 3 leaflet valve or a bicuspid Av.  It seems to behave like a typical 3 leaflet valve  Will consider a TEE for further evaluation at some point in the future.         Recent Labs: Lab Results  Component Value Date   WBC 11.0 (H) 12/06/2019   HGB 13.0 12/06/2019   HCT 39.2 12/06/2019   PLT 267 12/06/2019   GLUCOSE 92 12/06/2019   CHOL 184 03/26/2019   TRIG 84.0 03/26/2019   HDL 62.90 03/26/2019   LDLCALC 104 (H) 03/26/2019   ALT 21 12/06/2019   AST 21 12/06/2019   NA 140 12/06/2019   K 4.8 12/06/2019   CL 106 12/06/2019   CREATININE 0.79 12/06/2019   BUN 12 12/06/2019   CO2 25 12/06/2019   TSH 4.02 05/05/2019   INR 0.97 09/11/2017   Aortic Size Index=  4.2     /Body surface area is 1.76 meters  squared. = 2.38  < 2.75 cm/m2      4% risk per year 2.75 to 4.25          8% risk per year > 4.25 cm/m2    20% risk per year     Assessment / Plan:   #1 status post right lower lobectomy for stage I non-small cell carcinoma of the lung, without evidence of recurrence -now 18 months postop.  She has a follow-up CT scan scheduled for June 2022-ordered by medical oncology.  She will return to see [place] soon after that both the follow-up on her lung resection and also the mildly dilated ascending aorta incidentally noted on her CTs done for evaluation of lung cancer.  She does have family history of ascending aortic aneurysms  #2 incidentally noted ascending aorta 4.2 cm, no murmur associated with aortic insufficiency,-unchanged on follow-up CT scan continue to monitor  Return late June early July 2022 after CT scan done through [place] 02/17/2020 10:36 AM

## 2020-03-26 ENCOUNTER — Other Ambulatory Visit: Payer: Self-pay | Admitting: Endocrinology

## 2020-03-29 ENCOUNTER — Encounter: Payer: Self-pay | Admitting: Endocrinology

## 2020-03-29 ENCOUNTER — Ambulatory Visit: Payer: Medicare PPO | Admitting: Endocrinology

## 2020-03-29 ENCOUNTER — Other Ambulatory Visit: Payer: Self-pay

## 2020-03-29 VITALS — BP 110/70 | HR 67 | Ht 67.0 in | Wt 141.0 lb

## 2020-03-29 DIAGNOSIS — E039 Hypothyroidism, unspecified: Secondary | ICD-10-CM

## 2020-03-29 NOTE — Patient Instructions (Addendum)
Please stop by lab before you go If you have mychart- we will send your results within 3 business days of [place] receiving them.  If you do not have mychart- we will call you about results within 5 business days of [place] receiving them.  *please note we are currently using [facility] which has a longer processing time than [facility] typically so labs may not come back as quickly as in the past *please also note that you will see labs on mychart as soon as they post. I will later go in and write notes on them- will say "notes from Dr. Yong Channel"   Schedule your bone density test at check out desk. You may also call directly to X-ray at [phone] to schedule an appointment that is convenient for you.  - located [address] across the street from [place] - in the basement - you do need an appointment for the bone density tests.   Glad you are doing so well!

## 2020-03-29 NOTE — Progress Notes (Signed)
Phone [phone]   Subjective:  Patient presents today for their annual physical. Chief complaint-noted.   See problem oriented charting- ROS- full  review of systems was completed and negative except for:  Post nasal drip, headaches, back/side pain (has tried some meds but no relief through pain management), neck pain  The following were reviewed and entered/updated in epic: Past Medical History:  Diagnosis Date  . Arthritis    ddd  . Chronic headaches    imprved recently per patient   . Emphysema of lung ([place])    asymptomatic. noted on CT  . Family history of colon cancer   . History of adenomatous polyp of colon    2015 precancerous polyp history- due spring 2020.  5 years  . History of seizures    1980s few ??? seizure activity  (vagal response)  . Hypertension   . Lung cancer ([place]) dx'd 07/2017  . Palpitations   . RBBB (right bundle branch block)    palpitations  . Skin cancer    skin basal cell cancer   Patient Active Problem List   Diagnosis Date Noted  . Ascending aortic aneurysm ([place]) 08/27/2018    Priority: High  . History of atrial fibrillation 12/29/2017    Priority: High  . Alcoholism in remission ([place]) 12/29/2017    Priority: High  . Emphysema of lung ([place])     Priority: High  . S/P lobectomy of lung 09/16/2017    Priority: High  . History of lung cancer- right lower lobe s/p right lobectomy 08/06/2017    Priority: High  . History of adenomatous polyp of colon 05/11/2019    Priority: Medium  . Hypothyroidism 04/30/2019    Priority: Medium  . Hypertension 12/29/2017    Priority: Medium  . History of skin cancer 12/29/2017    Priority: Medium  . Chronic headaches     Priority: Medium  . Left thyroid nodule 10/07/2017    Priority: Medium  . Palpitations 03/25/2017    Priority: Medium  . Benign paroxysmal positional vertigo of right ear 05/25/2018    Priority: Low  . Lens replaced by other means 08/12/2017    Priority: Low  . Tear film  insufficiency 08/12/2017    Priority: Low  . Vitreous degeneration 08/12/2017    Priority: Low  . Cervicogenic headache 08/11/2017    Priority: Low  . DDD (degenerative disc disease), cervical 08/11/2017    Priority: Low  . Arthritis of facet joint of cervical spine 07/28/2017    Priority: Low  . RBBB 07/11/2015    Priority: Low  . Senile nuclear sclerosis 12/25/2011    Priority: Low  . Myopia 12/10/2011    Priority: Low  . Lesion of right lung 08/06/2017   Past Surgical History:  Procedure Laterality Date  . BLADDER SURGERY  1985   Long term incontinence. [facility]- used small intestine to build bladder  . BREAST BIOPSY  2001  . CATARACT EXTRACTION W/ INTRAOCULAR LENS  IMPLANT, BILATERAL    . Intestinal blockage  1989 and 1990   bowel obstruction surgery  . Lung cancer removal   08/2017   Right lower lobe  . VIDEO ASSISTED THORACOSCOPY (VATS)/WEDGE RESECTION Right 09/16/2017   Procedure: VIDEO ASSISTED THORACOSCOPY (VATS)/LUNG RESECTION;  Surgeon: [name], MD;  Location: [facility];  Service: Thoracic;  Laterality: Right;  Marland Kitchen VIDEO BRONCHOSCOPY N/A 09/16/2017   Procedure: VIDEO BRONCHOSCOPY;  Surgeon: [name], MD;  Location: [facility];  Service: Thoracic;  Laterality: N/A;  .  WRIST SURGERY     de quervains tenosynovitis. Dr. Maureen Ralphs actually did this.     Family History  Problem Relation Age of Onset  . Dementia Mother   . Heart attack Mother        age 64. after hip fracture.   . Hypertension Mother   . Prostate cancer Father   . Hypertension Father   . Breast cancer Sister   . Hypertension Sister   . Heart attack Sister        8. former smoker  . Heart attack Paternal Grandfather   . Hypertension Sister   . Colon cancer Other        family History  . Colon polyps Other        family history    Medications- reviewed and updated Current Outpatient Medications  Medication Sig Dispense Refill  . acetaminophen (TYLENOL) 325 MG tablet Take 650 mg by mouth  every 6 (six) hours as needed for mild pain.    . fluticasone (FLONASE) 50 MCG/ACT nasal spray Place 1 spray into both nostrils daily.    Marland Kitchen levothyroxine (SYNTHROID) 75 MCG tablet TAKE 1 TABLET BY MOUTH EVERY DAY 90 tablet 3  . metoprolol tartrate (LOPRESSOR) 25 MG tablet TAKE 1 TABLET BY MOUTH TWICE A DAY PLEASE MAKE YEARLY APPT 180 tablet 3  . Multiple Vitamins-Minerals (CENTRUM ADULTS PO) Take 1 tablet by mouth daily.    Marland Kitchen Propylene Glycol-Glycerin (SOOTHE OP) Place 1 drop into both eyes 2 (two) times daily as needed (dry eyes).     No current facility-administered medications for this visit.    Allergies-reviewed and updated Allergies  Allergen Reactions  . Codeine Nausea And Vomiting  . Quinolones     Patient was warned about not using Cipro and similar antibiotics. Recent studies have raised concern that fluoroquinolone antibiotics could be associated with an increased risk of aortic aneurysm Fluoroquinolones have non-antimicrobial properties that might jeopardise the integrity of the extracellular matrix of the vascular wall In a  propensity score matched cohort study in [place], there was a 66% increased rate of aortic aneurysm or dissection associated with oral fluoroquinolone use, compared wit    Social History   Social History Narrative   Single. Lives alone.    Lived in [place] for some years to help with parents but otherwise in [place] most of life.       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 Oropharynx normal Neck: mild thyromegaly CV: RRR no murmurs rubs or  gallops Lungs: CTAB no crackles, wheeze, rhonchi Abdomen: soft/nontender/nondistended/normal bowel sounds. No rebound or guarding.  Ext: no edema Skin: warm, dry Neuro: grossly normal, moves all extremities, PERRLA Breasts: normal appearance, no masses or tenderness    Assessment and Plan   69 y.o. female presenting for annual physical.  Health Maintenance counseling: 1. Anticipatory guidance: Patient counseled regarding regular dental exams q6 months, eye exams- yearly,  avoiding smoking and second hand smoke, remains alcohol free (history of alcoholism) 2.  Risk factor reduction:  Advised patient of need for regular exercise and diet rich and fruits and vegetables to reduce risk of heart attack and stroke. Exercise- Does water aerobics- 3 days a week- and walks- 2 days a week 5 miles . Diet-  Remains beef and pork free, reasonably healthy diet Wt Readings from Last 3 Encounters:  03/30/20 140 lb (63.5 kg)  03/29/20 141 lb (64 kg)  02/17/20 144 lb (65.3 kg)  3. Immunizations/screenings/ancillary studies-already received flu shot this year.  Thanked her for completing Shingrix vaccination series.  Completed COVID-19 series-told her we are still waiting on booster information Immunization History  Administered Date(s) Administered  . Fluad Quad(high Dose 65+) 03/26/2019  . Influenza Split 05/09/2011, 04/26/2012, 03/24/2014, 04/22/2014  . Influenza Whole 03/19/2017  . Influenza, High Dose Seasonal PF 04/24/2016, 03/20/2018  . Influenza, Seasonal, Injecte, Preservative Fre 04/11/2015  . Influenza-Unspecified 03/19/2017, 03/31/2017, 03/24/2020  . Moderna SARS-COVID-2 Vaccination 07/26/2019, 08/24/2019  . PPD Test 09/13/2014  . Pneumococcal Conjugate-13 06/15/2015  . Pneumococcal Polysaccharide-23 03/19/2017  . Tdap 09/15/2014  . Zoster 06/11/2013  . Zoster Recombinat (Shingrix) 03/13/2018, 05/12/2019, 10/26/2019   4. Cervical cancer screening- no history of abnormal Pap smear.  Past age  based screening recommendations 5. Breast cancer screening-  breast exam normal today and mammogram August 16, 2019 6. Colon cancer screening - q3 year repeat now. patient with history of polyps.  Colonoscopy within our system May 06, 2019 7. Skin cancer screening-see skin surgery center yearly due to history of skin cancer. advised regular sunscreen use. Denies worrisome, changing, or new skin lesions.  8. Birth control/STD check- postmenopausal.  Single/not sexually active 9. Osteoporosis screening at 64- osteopenia 2017 with worst T score -2.0 and lumbar spine.  Planned last year but did not complete- ordered again today -Former smoker quit smoking in 2010-history of lung cancer.  Get urinalysis today  Status of chronic or acute concerns   #History of lung cancer-right lower lobe lobectomy March 2019 continues to follow with Dr. Servando Snare.  Patient was recently seen in August.  Repeat scan planned June 2022. Spaced out to yearly visits! Did have possible bone lesion November 2020 but follow up pet scan reassuring.   #Ascending aortic aneurysm 4.2 cm-plans for annual CT scan.  Follow with Dr. Acie Fredrickson as well  #Alcoholism in remission (HCC)-congratulated patient on remaining alcohol free   #hypertension- controlled/palpitations- - none recently S: medication: Metoprolol 25Mg .  No palpitations on metoprolol Home readings #s: not regularly A/P: Stable. Continue current medications.  Ideally <120 with aneurysm history  #hypothyroidism-following with Dr. Loanne Drilling.  She saw him yesterday and he recommended repeat ultrasound as well as TSH and T4 S: compliant On thyroid medication-Synthroid 49mcg Lab Results  Component Value Date   TSH 4.02 05/05/2019   A/P:hopefully controlled update tsh and t4 today and forward to Dr. Loanne Drilling  #Pulmonary emphysema, unspecified emphysema type (HCC)-patient largely asymptomatic.  Simply noted on imaging. Stable without meds- monitor  #History of atrial  fibrillation-no recurrence after her surgery   #Chronic nonintractable headache-ongoing issues with neck tension can cause headaches.  Steroid injections in the past were not very helpful  # post nasal drip - relief with flonase   Recommended follow up: Return in about 1 year (around 03/30/2021) for physical or sooner if needed. Future Appointments  Date Time Provider [place]  06/12/2020  3:15 PM Liliane Shi, [place] [facility]  12/05/2020 11:00 AM [facility] None  12/07/2020 10:30 AM Curt Bears, MD  [facility] None  03/29/2021 10:00 AM Renato Shin, MD [facility] None   Lab/Order associations: fasting   ICD-10-CM   1. Preventative health care  Z00.00   2. Primary hypertension  O03 COMPLETE METABOLIC PANEL WITH GFR    Lipid panel    CBC with Differential/Platelet  3. Acquired hypothyroidism  E03.9 TSH    T4, free  4. Ascending aortic aneurysm (HCC)  I71.2   5. Alcoholism in remission ([place])  F10.21   6. Pulmonary emphysema, unspecified emphysema type ([place])  J43.9   7. History of atrial fibrillation  Z86.79   8. History of lung cancer- right lower lobe s/p right lobectomy  Z85.118   9. Palpitations  R00.2   10. Chronic nonintractable headache, unspecified headache type  R51.9    G89.29   11. History of adenomatous polyp of colon  Z86.010   12. History of skin cancer  Z85.828   13. Former smoker  Z87.891 POCT Urinalysis Dipstick (Automated)  14. Osteopenia of lumbar spine  M85.88 DG Bone Density   Return precautions advised.  Garret Reddish, MD

## 2020-03-29 NOTE — Progress Notes (Signed)
Subjective:    Patient ID: Margaret Charles, female    DOB: 03/19/1951, 69 y.o.   MRN: [id number]  HPI Pt returns for f/u of MNG (dx'ed early 2019, on a PET CT; no nodule met criteria for bx or f/u, based on [place] criteria; she also has h/o stage 1 SCCA of the lung; she takes synthroid for hypothyroidism; f/u US in 2020 showed nodules were smaller; but the nodules were visible on 2020 PET CT).  she does not notice the goiter. She takes synthroid as rx'ed.   Past Medical History:  Diagnosis Date  . Arthritis    ddd  . Chronic headaches    imprved recently per patient   . Emphysema of lung ([place])    asymptomatic. noted on CT  . Family history of colon cancer   . History of adenomatous polyp of colon    2015 precancerous polyp history- due spring 2020.  5 years  . History of seizures    1980s few ??? seizure activity  (vagal response)  . Hypertension   . Lung cancer ([place]) dx'd 07/2017  . Palpitations   . RBBB (right bundle branch block)    palpitations  . Skin cancer    skin basal cell cancer    Past Surgical History:  Procedure Laterality Date  . BLADDER SURGERY  1985   Long term incontinence. [facility]- used small intestine to build bladder  . BREAST BIOPSY  2001  . CATARACT EXTRACTION W/ INTRAOCULAR LENS  IMPLANT, BILATERAL    . Intestinal blockage  1989 and 1990   bowel obstruction surgery  . Lung cancer removal   08/2017   Right lower lobe  . VIDEO ASSISTED THORACOSCOPY (VATS)/WEDGE RESECTION Right 09/16/2017   Procedure: VIDEO ASSISTED THORACOSCOPY (VATS)/LUNG RESECTION;  Surgeon: [name], MD;  Location: [facility];  Service: Thoracic;  Laterality: Right;  Marland Kitchen VIDEO BRONCHOSCOPY N/A 09/16/2017   Procedure: VIDEO BRONCHOSCOPY;  Surgeon: [name], MD;  Location: [facility] OR;  Service: Thoracic;  Laterality: N/A;  . WRIST SURGERY     de quervains tenosynovitis. Dr. Maureen Ralphs actually did this.     Social History   Socioeconomic History  . Marital status: Single    Spouse  name: Not on file  . Number of children: Not on file  . Years of education: Not on file  . Highest education level: Not on file  Occupational History  . Occupation: Retired Pharmacist, hospital  Tobacco Use  . Smoking status: Former Smoker    Packs/day: 1.00    Years: 40.00    Pack years: 40.00    Types: Cigarettes    Quit date: 12/03/2008    Years since quitting: 11.3  . Smokeless tobacco: Never Used  Vaping Use  . Vaping Use: Never used  Substance and Sexual Activity  . Alcohol use: Not Currently    Comment: hx  etoh  37 yrs  . Drug use: Never  . Sexual activity: Not Currently  Other Topics Concern  . Not on file  Social History Narrative   Single. Lives alone.    Lived in [place] for some years to help with parents but otherwise in [place] most of life.       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  Difficulty of Paying Living Expenses: Not on file  Food Insecurity:   . Worried About Charity fundraiser in the Last Year: Not on file  . Ran Out of Food in the Last Year: Not on file  Transportation Needs:   . Lack of Transportation (Medical): Not on file  . Lack of Transportation (Non-Medical): Not on file  Physical Activity:   . Days of Exercise per Week: Not on file  . Minutes of Exercise per Session: Not on file  Stress:   . Feeling of Stress : Not on file  Social Connections:   . Frequency of Communication with Friends and Family: Not on file  . Frequency of Social Gatherings with Friends and Family: Not on file  . Attends Religious Services: Not on file  . Active Member of Clubs or Organizations: Not on file  . Attends Archivist Meetings: Not on file  . Marital Status: Not on file  Intimate Partner Violence:   . Fear of Current or  Ex-Partner: Not on file  . Emotionally Abused: Not on file  . Physically Abused: Not on file  . Sexually Abused: Not on file    Current Outpatient Medications on File Prior to Visit  Medication Sig Dispense Refill  . acetaminophen (TYLENOL) 325 MG tablet Take 650 mg by mouth every 6 (six) hours as needed for mild pain.    . fluticasone (FLONASE) 50 MCG/ACT nasal spray Place 1 spray into both nostrils daily.    Marland Kitchen levothyroxine (SYNTHROID) 75 MCG tablet TAKE 1 TABLET BY MOUTH EVERY DAY 90 tablet 3  . metoprolol tartrate (LOPRESSOR) 25 MG tablet TAKE 1 TABLET BY MOUTH TWICE A DAY PLEASE MAKE YEARLY APPT 180 tablet 3  . Multiple Vitamins-Minerals (CENTRUM ADULTS PO) Take 1 tablet by mouth daily.    Marland Kitchen Propylene Glycol-Glycerin (SOOTHE OP) Place 1 drop into both eyes 2 (two) times daily as needed (dry eyes).     No current facility-administered medications on file prior to visit.    Allergies  Allergen Reactions  . Codeine Nausea And Vomiting  . Quinolones     Patient was warned about not using Cipro and similar antibiotics. Recent studies have raised concern that fluoroquinolone antibiotics could be associated with an increased risk of aortic aneurysm Fluoroquinolones have non-antimicrobial properties that might jeopardise the integrity of the extracellular matrix of the vascular wall In a  propensity score matched cohort study in [place], there was a 66% increased rate of aortic aneurysm or dissection associated with oral fluoroquinolone use, compared wit    Family History  Problem Relation Age of Onset  . Dementia Mother   . Heart attack Mother        age 69. after hip fracture.   . Hypertension Mother   . Prostate cancer Father   . Hypertension Father   . Breast cancer Sister   . Hypertension Sister   . Heart attack Sister        3. former smoker  . Heart attack Paternal Grandfather   . Hypertension Sister   . Colon cancer Other        family History  . Colon polyps Other          family history    BP 110/70   Pulse 67   Ht $R'5\' 7"'kP$  (1.702 m)   Wt 141 lb (64 kg)   SpO2 97%   BMI 22.08 kg/m    Review of Systems     Objective:  Physical Exam VITAL SIGNS:  See vs page GENERAL: no distress NECK: There is no palpable thyroid enlargement.  No thyroid nodule is palpable.  No palpable lymphadenopathy at the anterior neck.  Lab Results  Component Value Date   TSH 2.27 03/30/2020       Assessment & Plan:  Hypothyroidism: well-replaced.  Please continue the same synthroid.

## 2020-03-29 NOTE — Patient Instructions (Addendum)
Blood tests are requested for you today.  We'll let you know about the results.  Let's recheck the ultrasound.  you will receive a phone call, about a day and time for an appointment.   If these results are good, please come back for a follow-up appointment in 1 year.

## 2020-03-30 ENCOUNTER — Ambulatory Visit (INDEPENDENT_AMBULATORY_CARE_PROVIDER_SITE_OTHER): Payer: Medicare PPO | Admitting: Family Medicine

## 2020-03-30 ENCOUNTER — Other Ambulatory Visit: Payer: Self-pay

## 2020-03-30 ENCOUNTER — Encounter: Payer: Self-pay | Admitting: Family Medicine

## 2020-03-30 VITALS — BP 114/62 | HR 57 | Temp 97.3°F | Resp 18 | Ht 67.0 in | Wt 140.0 lb

## 2020-03-30 DIAGNOSIS — I712 Thoracic aortic aneurysm, without rupture: Secondary | ICD-10-CM

## 2020-03-30 DIAGNOSIS — E039 Hypothyroidism, unspecified: Secondary | ICD-10-CM

## 2020-03-30 DIAGNOSIS — I10 Essential (primary) hypertension: Secondary | ICD-10-CM | POA: Diagnosis not present

## 2020-03-30 DIAGNOSIS — I7121 Aneurysm of the ascending aorta, without rupture: Secondary | ICD-10-CM

## 2020-03-30 DIAGNOSIS — Z85828 Personal history of other malignant neoplasm of skin: Secondary | ICD-10-CM

## 2020-03-30 DIAGNOSIS — G8929 Other chronic pain: Secondary | ICD-10-CM

## 2020-03-30 DIAGNOSIS — Z87891 Personal history of nicotine dependence: Secondary | ICD-10-CM

## 2020-03-30 DIAGNOSIS — R002 Palpitations: Secondary | ICD-10-CM | POA: Diagnosis not present

## 2020-03-30 DIAGNOSIS — Z0000 Encounter for general adult medical examination without abnormal findings: Secondary | ICD-10-CM

## 2020-03-30 DIAGNOSIS — M8588 Other specified disorders of bone density and structure, other site: Secondary | ICD-10-CM

## 2020-03-30 DIAGNOSIS — Z8679 Personal history of other diseases of the circulatory system: Secondary | ICD-10-CM | POA: Diagnosis not present

## 2020-03-30 DIAGNOSIS — Z85118 Personal history of other malignant neoplasm of bronchus and lung: Secondary | ICD-10-CM | POA: Diagnosis not present

## 2020-03-30 DIAGNOSIS — J439 Emphysema, unspecified: Secondary | ICD-10-CM

## 2020-03-30 DIAGNOSIS — Z86010 Personal history of colonic polyps: Secondary | ICD-10-CM

## 2020-03-30 DIAGNOSIS — F1021 Alcohol dependence, in remission: Secondary | ICD-10-CM | POA: Diagnosis not present

## 2020-03-30 DIAGNOSIS — R519 Headache, unspecified: Secondary | ICD-10-CM

## 2020-03-30 LAB — POC URINALSYSI DIPSTICK (AUTOMATED)
Bilirubin, UA: NEGATIVE
Blood, UA: NEGATIVE
Glucose, UA: NEGATIVE
Ketones, UA: NEGATIVE
Leukocytes, UA: NEGATIVE
Nitrite, UA: NEGATIVE
Protein, UA: POSITIVE — AB
Spec Grav, UA: 1.015 (ref 1.010–1.025)
Urobilinogen, UA: 0.2 E.U./dL
pH, UA: 6 (ref 5.0–8.0)

## 2020-03-31 ENCOUNTER — Telehealth: Payer: Self-pay

## 2020-03-31 LAB — COMPLETE METABOLIC PANEL WITH GFR
AG Ratio: 1.5 (calc) (ref 1.0–2.5)
ALT: 18 U/L (ref 6–29)
AST: 19 U/L (ref 10–35)
Albumin: 4.1 g/dL (ref 3.6–5.1)
Alkaline phosphatase (APISO): 103 U/L (ref 37–153)
BUN: 12 mg/dL (ref 7–25)
CO2: 25 mmol/L (ref 20–32)
Calcium: 9.3 mg/dL (ref 8.6–10.4)
Chloride: 105 mmol/L (ref 98–110)
Creat: 0.81 mg/dL (ref 0.60–0.93)
GFR, Est African American: 85 mL/min/{1.73_m2} (ref >=60)
GFR, Est Non African American: 74 mL/min/{1.73_m2} (ref >=60)
Globulin: 2.8 g/dL (calc) (ref 1.9–3.7)
Glucose, Bld: 95 mg/dL (ref 65–99)
Potassium: 4.2 mmol/L (ref 3.5–5.3)
Sodium: 140 mmol/L (ref 135–146)
Total Bilirubin: 0.6 mg/dL (ref 0.2–1.2)
Total Protein: 6.9 g/dL (ref 6.1–8.1)

## 2020-03-31 LAB — CBC WITH DIFFERENTIAL/PLATELET
Absolute Monocytes: 686 cells/uL (ref 200–950)
Basophils Absolute: 47 cells/uL (ref 0–200)
Basophils Relative: 0.5 %
Eosinophils Absolute: 160 cells/uL (ref 15–500)
Eosinophils Relative: 1.7 %
HCT: 40.7 % (ref 35.0–45.0)
Hemoglobin: 13.6 g/dL (ref 11.7–15.5)
Lymphs Abs: 2453 cells/uL (ref 850–3900)
MCH: 30.6 pg (ref 27.0–33.0)
MCHC: 33.4 g/dL (ref 32.0–36.0)
MCV: 91.5 fL (ref 80.0–100.0)
MPV: 10.8 fL (ref 7.5–12.5)
Monocytes Relative: 7.3 %
Neutro Abs: 6054 cells/uL (ref 1500–7800)
Neutrophils Relative %: 64.4 %
Platelets: 292 10*3/uL (ref 140–400)
RBC: 4.45 10*6/uL (ref 3.80–5.10)
RDW: 11.9 % (ref 11.0–15.0)
Total Lymphocyte: 26.1 %
WBC: 9.4 10*3/uL (ref 3.8–10.8)

## 2020-03-31 LAB — LIPID PANEL
Cholesterol: 180 mg/dL (ref <200)
HDL: 61 mg/dL (ref >=50)
LDL Cholesterol (Calc): 100 mg/dL (calc) — ABNORMAL HIGH
Non-HDL Cholesterol (Calc): 119 mg/dL (calc) (ref <130)
Total CHOL/HDL Ratio: 3 (calc) (ref <5.0)
Triglycerides: 96 mg/dL (ref <150)

## 2020-03-31 LAB — TSH: TSH: 2.27 mIU/L (ref 0.40–4.50)

## 2020-03-31 LAB — T4, FREE: Free T4: 1.3 ng/dL (ref 0.8–1.8)

## 2020-03-31 NOTE — Telephone Encounter (Signed)
Returned pt call to discuss labs.

## 2020-03-31 NOTE — Telephone Encounter (Signed)
Patient is returning call regarding labs, she will be available for a call back between 12-230

## 2020-04-10 ENCOUNTER — Ambulatory Visit (INDEPENDENT_AMBULATORY_CARE_PROVIDER_SITE_OTHER)
Admission: RE | Admit: 2020-04-10 | Discharge: 2020-04-10 | Disposition: A | Discharge status: Home / Self Care | Payer: Medicare PPO | Source: Ambulatory Visit | Attending: Family Medicine | Admitting: Family Medicine

## 2020-04-10 ENCOUNTER — Other Ambulatory Visit: Payer: Self-pay

## 2020-04-10 DIAGNOSIS — M8588 Other specified disorders of bone density and structure, other site: Secondary | ICD-10-CM | POA: Diagnosis not present

## 2020-04-12 ENCOUNTER — Ambulatory Visit
Admission: RE | Admit: 2020-04-12 | Discharge: 2020-04-12 | Disposition: A | Discharge status: Home / Self Care | Payer: Medicare PPO | Source: Ambulatory Visit | Attending: Endocrinology | Admitting: Endocrinology

## 2020-04-12 DIAGNOSIS — E039 Hypothyroidism, unspecified: Secondary | ICD-10-CM

## 2020-04-12 DIAGNOSIS — E041 Nontoxic single thyroid nodule: Secondary | ICD-10-CM | POA: Diagnosis not present

## 2020-05-24 DIAGNOSIS — H43811 Vitreous degeneration, right eye: Secondary | ICD-10-CM | POA: Diagnosis not present

## 2020-05-24 DIAGNOSIS — H26493 Other secondary cataract, bilateral: Secondary | ICD-10-CM | POA: Diagnosis not present

## 2020-05-29 DIAGNOSIS — L814 Other melanin hyperpigmentation: Secondary | ICD-10-CM | POA: Diagnosis not present

## 2020-05-29 DIAGNOSIS — L570 Actinic keratosis: Secondary | ICD-10-CM | POA: Diagnosis not present

## 2020-05-29 DIAGNOSIS — L578 Other skin changes due to chronic exposure to nonionizing radiation: Secondary | ICD-10-CM | POA: Diagnosis not present

## 2020-05-29 DIAGNOSIS — L821 Other seborrheic keratosis: Secondary | ICD-10-CM | POA: Diagnosis not present

## 2020-05-29 DIAGNOSIS — D1801 Hemangioma of skin and subcutaneous tissue: Secondary | ICD-10-CM | POA: Diagnosis not present

## 2020-05-29 DIAGNOSIS — D225 Melanocytic nevi of trunk: Secondary | ICD-10-CM | POA: Diagnosis not present

## 2020-06-07 ENCOUNTER — Other Ambulatory Visit: Payer: Self-pay | Admitting: Cardiovascular Disease

## 2020-06-12 ENCOUNTER — Ambulatory Visit: Payer: Medicare PPO | Admitting: Physician Assistant

## 2020-06-12 ENCOUNTER — Encounter: Payer: Self-pay | Admitting: Physician Assistant

## 2020-06-12 ENCOUNTER — Other Ambulatory Visit: Payer: Self-pay

## 2020-06-12 VITALS — BP 110/70 | HR 69 | Ht 67.0 in | Wt 141.4 lb

## 2020-06-12 DIAGNOSIS — I2510 Atherosclerotic heart disease of native coronary artery without angina pectoris: Secondary | ICD-10-CM | POA: Diagnosis not present

## 2020-06-12 DIAGNOSIS — I10 Essential (primary) hypertension: Secondary | ICD-10-CM | POA: Diagnosis not present

## 2020-06-12 DIAGNOSIS — I480 Paroxysmal atrial fibrillation: Secondary | ICD-10-CM | POA: Diagnosis not present

## 2020-06-12 DIAGNOSIS — I2584 Coronary atherosclerosis due to calcified coronary lesion: Secondary | ICD-10-CM

## 2020-06-12 DIAGNOSIS — I77810 Thoracic aortic ectasia: Secondary | ICD-10-CM | POA: Diagnosis not present

## 2020-06-12 NOTE — Progress Notes (Signed)
Cardiology Office Note:    Date:  06/12/2020   ID:  Margaret Charles, DOB 09/04/1950, MRN [id number]  PCP:  [name], MD  [facility] Cardiologist:  Mertie Moores, MD   [facility] Electrophysiologist:  None   Referring MD: [name], MD   Chief Complaint:  Follow-up (HTN, Coronary Ca2+, Thoracic aorta dilation )    Patient Profile:    Margaret Charles is a 69 y.o. female with:   Paroxysmal atrial fibrillation   1 episode, postop>> not on anticoagulation  RBBB  Lung CA   S/p RLL lobectomy    Ex-smoker  Hypertension   Hypothyroidism   Thoracic aortic aneurysm   CT 6/21: 4.1 cm  Prior CV studies: Chest CT 12/06/2019 Aortic atherosclerosis Coronary artery calcification Dilated ascending aorta, 4.1 cm  Echocardiogram 09/02/2018 EF 55-60, Gr 1 DD, normal RVSF  History of Present Illness:    Margaret Charles was last seen by Dr. Acie Fredrickson in 05/2019.  She returns for f/u.  She is here alone.  Overall, she has been doing well.  She has not had chest pain, shortness of breath, syncope, orthopnea, leg edema      Past Medical History:  Diagnosis Date  . Arthritis    ddd  . Chronic headaches    imprved recently per patient   . Emphysema of lung ([place])    asymptomatic. noted on CT  . Family history of colon cancer   . History of adenomatous polyp of colon    2015 precancerous polyp history- due spring 2020.  5 years  . History of seizures    1980s few ??? seizure activity  (vagal response)  . Hypertension   . Lung cancer ([place]) dx'd 07/2017  . Palpitations   . RBBB (right bundle branch block)    palpitations  . Skin cancer    skin basal cell cancer    Current Medications: Current Meds  Medication Sig  . acetaminophen (TYLENOL) 325 MG tablet Take 650 mg by mouth every 6 (six) hours as needed for mild pain.  . fluticasone (FLONASE) 50 MCG/ACT nasal spray Place 1 spray into both nostrils daily.  Marland Kitchen levothyroxine (SYNTHROID) 75 MCG tablet TAKE 1  TABLET BY MOUTH EVERY DAY  . metoprolol tartrate (LOPRESSOR) 25 MG tablet Take 1 tablet (25 mg total) by mouth 2 (two) times daily.  . Multiple Vitamins-Minerals (CENTRUM ADULTS PO) Take 1 tablet by mouth daily.  Marland Kitchen Propylene Glycol-Glycerin (SOOTHE OP) Place 1 drop into both eyes 2 (two) times daily as needed (dry eyes).  . TOLAK 4 % CREA Apply 1 application topically at bedtime as needed (FOR FACE).     Allergies:   Codeine and Quinolones   Social History   Tobacco Use  . Smoking status: Former Smoker    Packs/day: 1.00    Years: 40.00    Pack years: 40.00    Types: Cigarettes    Quit date: 12/03/2008    Years since quitting: 11.5  . Smokeless tobacco: Never Used  Vaping Use  . Vaping Use: Never used  Substance Use Topics  . Alcohol use: Not Currently    Comment: hx  etoh  37 yrs  . Drug use: Never     Family Hx: The patient's family history includes Breast cancer in her sister; Colon cancer in an other family member; Colon polyps in an other family member; Dementia in her mother; Heart attack in her mother, paternal grandfather, and sister; Hypertension in her father, mother, sister, and  sister; Prostate cancer in her father.  ROS   EKGs/Labs/Other Test Reviewed:    EKG:  EKG is   ordered today.  The ekg ordered today demonstrates normal sinus rhythm, heart rate 69, rightward axis, low voltage, right bundle branch block, increased artifact, QTC 460, no change from prior tracing  Recent Labs: 03/30/2020: ALT 18; BUN 12; Creat 0.81; Hemoglobin 13.6; Platelets 292; Potassium 4.2; Sodium 140; TSH 2.27   Recent Lipid Panel Lab Results  Component Value Date/Time   CHOL 180 03/30/2020 09:20 AM   TRIG 96 03/30/2020 09:20 AM   HDL 61 03/30/2020 09:20 AM   CHOLHDL 3.0 03/30/2020 09:20 AM   LDLCALC 100 (H) 03/30/2020 09:20 AM      Risk Assessment/Calculations:     CHA2DS2-VASc Score = 4  This indicates a 4.8% annual risk of stroke. The patient's score is based upon: CHF  History: No HTN History: Yes Diabetes History: No Stroke History: No Vascular Disease History: Yes Age Score: 1 Gender Score: 1     Physical Exam:    VS:  BP 110/70   Pulse 69   Ht 5\' 7"  (1.702 m)   Wt 141 lb 6.4 oz (64.1 kg)   SpO2 94%   BMI 22.15 kg/m     Wt Readings from Last 3 Encounters:  06/12/20 141 lb 6.4 oz (64.1 kg)  03/30/20 140 lb (63.5 kg)  03/29/20 141 lb (64 kg)     Constitutional:      Appearance: Healthy appearance. Not in distress.  Neck:     Vascular: JVD normal.     Lymphadenopathy: No cervical adenopathy.  Pulmonary:     Effort: Pulmonary effort is normal.     Breath sounds: No wheezing. No rales.  Cardiovascular:     Normal rate. Regular rhythm. Normal S1. Normal S2.     Murmurs: There is no murmur.  Edema:    Peripheral edema absent.  Abdominal:     Palpations: Abdomen is soft.  Skin:    General: Skin is warm and dry.  Neurological:     Mental Status: Alert and oriented to person, place and time.     Cranial Nerves: Cranial nerves are intact.      ASSESSMENT & PLAN:    1. Paroxysmal atrial fibrillation (HCC) Singular episode after her chest surgery for lung cancer.  She has not had a recurrence.  She is not currently on anticoagulation due to this.  CHA2DS2-VASc Score = 4 [CHF History: No, HTN History: Yes, Diabetes History: No, Stroke History: No, Vascular Disease History: Yes, Age Score: 1, Gender Score: 1].  Therefore, the patient's annual risk of stroke is 4.8 %.   If she has recurrent atrial fibrillation, she will need anticoagulation.  2. Ascending aorta dilation (HCC) She has annual CT scans.  Her aorta on her most recent CT in June 2021 measured 4.1 cm.  3. Coronary artery calcification Noted on prior CT.  We discussed the results of the ASCEND, ASPREE & ARRIVE trials.  Overall, I am not convinced that she needs daily aspirin therapy.  However, she would likely benefit from the addition of low-dose statin therapy.  She has  discussed this previously with her PCP.  She wants to continue to work on diet and exercise.  If her LDL remains 100 or higher, we can reconsider low-dose statin therapy (rosuvastatin 5 mg daily) to reduce CV risk.  4. Essential hypertension The patient's blood pressure is controlled on her current regimen.  Continue current  therapy.       Dispo:  Return in about 1 year (around 06/12/2021) for Routine Follow Up, w/ Dr. Acie Fredrickson, or Richardson Dopp, PA-C, in person.   Medication Adjustments/Labs and Tests Ordered: Current medicines are reviewed at length with the patient today.  Concerns regarding medicines are outlined above.  Tests Ordered: Orders Placed This Encounter  Procedures  . EKG 12-Lead   Medication Changes: No orders of the defined types were placed in this encounter.   Signed, Richardson Dopp, PA-C  06/12/2020 4:06 PM    [facility] [place], [place], [place]  [address] Phone: [phone]; Fax: [phone]

## 2020-06-12 NOTE — Patient Instructions (Signed)
Medication Instructions:  Your physician recommends that you continue on your current medications as directed. Please refer to the Current Medication list given to you today.  *If you need a refill on your cardiac medications before your next appointment, please call your pharmacy*  Lab Work: None ordered today  If you have labs (blood work) drawn today and your tests are completely normal, you will receive your results only by:  [place] (if you have MyChart) OR  A paper copy in the mail If you have any lab test that is abnormal or we need to change your treatment, we will call you to review the results.  Testing/Procedures: None ordered today  Follow-Up: At [facility], you and your health needs are our priority.  As part of our continuing mission to provide you with exceptional heart care, we have created designated Provider Care Teams.  These Care Teams include your primary Cardiologist (physician) and Advanced Practice Providers (APPs -  Physician Assistants and Nurse Practitioners) who all work together to provide you with the care you need, when you need it.  Your next appointment:   12 month(s)  The format for your next appointment:   In Person  Provider:   You may see Mertie Moores, MD or Richardson Dopp, PA-C

## 2020-07-02 DIAGNOSIS — Z79899 Other long term (current) drug therapy: Secondary | ICD-10-CM

## 2020-07-02 DIAGNOSIS — E785 Hyperlipidemia, unspecified: Secondary | ICD-10-CM

## 2020-07-02 DIAGNOSIS — I2584 Coronary atherosclerosis due to calcified coronary lesion: Secondary | ICD-10-CM

## 2020-07-02 DIAGNOSIS — I77810 Thoracic aortic ectasia: Secondary | ICD-10-CM

## 2020-07-03 NOTE — Telephone Encounter (Signed)
Margaret Charles, Scott T, PA-C 4 minutes ago (12:09 PM)       Rosuvastatin 5 mg once daily  Obtain fasting Lipids and LFTs in 3 mos. Richardson Dopp, PA-C    07/03/2020 12:09 PM       Rosuvastatin 5 mg po daily will be sent in by Richardson Dopp PA-C to the pts pharmacy on file.  Pt will be needing lipids/LFTs in 3 months to better evaluate this.  Sent the pt a message via mychart that her lab appt is scheduled for 3 months out to check lipids and LFT on

## 2020-07-03 NOTE — Telephone Encounter (Signed)
Rosuvastatin 5 mg once daily  Obtain fasting Lipids and LFTs in 3 mos. Richardson Dopp, PA-C    07/03/2020 12:09 PM

## 2020-07-07 MED ORDER — ROSUVASTATIN CALCIUM 5 MG PO TABS: 5.0000 mg | ORAL_TABLET | Freq: Every day | ORAL | 1 refills | Status: DC

## 2020-07-07 NOTE — Addendum Note (Signed)
Addended by: Nuala Alpha on: 07/07/2020 08:20 AM   Modules accepted: Orders

## 2020-08-21 DIAGNOSIS — Z1231 Encounter for screening mammogram for malignant neoplasm of breast: Secondary | ICD-10-CM | POA: Diagnosis not present

## 2020-08-21 LAB — HM MAMMOGRAPHY

## 2020-08-30 ENCOUNTER — Other Ambulatory Visit: Payer: Self-pay | Admitting: Cardiovascular Disease

## 2020-09-04 DIAGNOSIS — L570 Actinic keratosis: Secondary | ICD-10-CM | POA: Diagnosis not present

## 2020-09-04 DIAGNOSIS — L578 Other skin changes due to chronic exposure to nonionizing radiation: Secondary | ICD-10-CM | POA: Diagnosis not present

## 2020-10-02 ENCOUNTER — Other Ambulatory Visit: Payer: Self-pay

## 2020-10-02 ENCOUNTER — Other Ambulatory Visit: Payer: Medicare PPO | Admitting: *Deleted

## 2020-10-02 DIAGNOSIS — E785 Hyperlipidemia, unspecified: Secondary | ICD-10-CM

## 2020-10-02 DIAGNOSIS — Z79899 Other long term (current) drug therapy: Secondary | ICD-10-CM

## 2020-10-02 DIAGNOSIS — I2584 Coronary atherosclerosis due to calcified coronary lesion: Secondary | ICD-10-CM | POA: Diagnosis not present

## 2020-10-02 DIAGNOSIS — I2510 Atherosclerotic heart disease of native coronary artery without angina pectoris: Secondary | ICD-10-CM | POA: Diagnosis not present

## 2020-10-02 DIAGNOSIS — I77810 Thoracic aortic ectasia: Secondary | ICD-10-CM

## 2020-10-02 LAB — LIPID PANEL
Chol/HDL Ratio: 2.2 ratio (ref 0.0–4.4)
Cholesterol, Total: 147 mg/dL (ref 100–199)
HDL: 66 mg/dL (ref >39)
LDL Chol Calc (NIH): 67 mg/dL (ref 0–99)
Triglycerides: 70 mg/dL (ref 0–149)
VLDL Cholesterol Cal: 14 mg/dL (ref 5–40)

## 2020-10-02 LAB — HEPATIC FUNCTION PANEL
ALT: 15 IU/L (ref 0–32)
AST: 15 IU/L (ref 0–40)
Albumin: 4.1 g/dL (ref 3.7–4.7)
Alkaline Phosphatase: 99 IU/L (ref 44–121)
Bilirubin Total: 0.4 mg/dL (ref 0.0–1.2)
Bilirubin, Direct: 0.12 mg/dL (ref 0.00–0.40)
Total Protein: 6.4 g/dL (ref 6.0–8.5)

## 2020-10-16 ENCOUNTER — Telehealth: Payer: Self-pay | Admitting: Family Medicine

## 2020-10-16 NOTE — Telephone Encounter (Signed)
Left message for patient to call back and schedule Medicare Annual Wellness Visit (AWV) either virtually OR in office.   Last AWV 09/14/19; please schedule at anytime with [facility]Nurse Health Advisor at [facility].  This should be a 45 minute visit.

## 2020-11-16 ENCOUNTER — Ambulatory Visit (INDEPENDENT_AMBULATORY_CARE_PROVIDER_SITE_OTHER): Payer: Medicare PPO

## 2020-11-16 ENCOUNTER — Other Ambulatory Visit: Payer: Self-pay

## 2020-11-16 VITALS — BP 110/64 | HR 81 | Temp 98.7°F | Wt 139.0 lb

## 2020-11-16 DIAGNOSIS — Z0000 Encounter for general adult medical examination without abnormal findings: Secondary | ICD-10-CM | POA: Diagnosis not present

## 2020-11-16 NOTE — Patient Instructions (Addendum)
Margaret Charles , Thank you for taking time to come for your Medicare Wellness Visit. I appreciate your ongoing commitment to your health goals. Please review the following plan we discussed and let me know if I can assist you in the future.   Screening recommendations/referrals: Colonoscopy: Done 05/06/19 Mammogram: Done 08/21/20 Bone Density: Done 04/10/20 Recommended yearly ophthalmology/optometry visit for glaucoma screening and checkup Recommended yearly dental visit for hygiene and checkup  Vaccinations: Influenza vaccine: Up to date Pneumococcal vaccine: Up to date Tdap vaccine: Up to date Shingles vaccine: Completed  05/12/19, & 10/26/19   Covid-19:Completed 2/1 & 08/23/19 and 11/03/20  Advanced directives: Please bring a copy of your health care power of attorney and living will to the office at your convenience.   Conditions/risks identified: None at this time   Next appointment: Follow up in one year for your annual wellness visit    Preventive Care 65 Years and Older, Female Preventive care refers to lifestyle choices and visits with your health care provider that can promote health and wellness. What does preventive care include?  A yearly physical exam. This is also called an annual well check.  Dental exams once or twice a year.  Routine eye exams. Ask your health care provider how often you should have your eyes checked.  Personal lifestyle choices, including:  Daily care of your teeth and gums.  Regular physical activity.  Eating a healthy diet.  Avoiding tobacco and drug use.  Limiting alcohol use.  Practicing safe sex.  Taking low-dose aspirin every day.  Taking vitamin and mineral supplements as recommended by your health care provider. What happens during an annual well check? The services and screenings done by your health care provider during your annual well check will depend on your age, overall health, lifestyle risk factors, and family history of  disease. Counseling  Your health care provider may ask you questions about your:  Alcohol use.  Tobacco use.  Drug use.  Emotional well-being.  Home and relationship well-being.  Sexual activity.  Eating habits.  History of falls.  Memory and ability to understand (cognition).  Work and work Statistician.  Reproductive health. Screening  You may have the following tests or measurements:  Height, weight, and BMI.  Blood pressure.  Lipid and cholesterol levels. These may be checked every 5 years, or more frequently if you are over 24 years old.  Skin check.  Lung cancer screening. You may have this screening every year starting at age 75 if you have a 30-pack-year history of smoking and currently smoke or have quit within the past 15 years.  Fecal occult blood test (FOBT) of the stool. You may have this test every year starting at age 54.  Flexible sigmoidoscopy or colonoscopy. You may have a sigmoidoscopy every 5 years or a colonoscopy every 10 years starting at age 42.  Hepatitis C blood test.  Hepatitis B blood test.  Sexually transmitted disease (STD) testing.  Diabetes screening. This is done by checking your blood sugar (glucose) after you have not eaten for a while (fasting). You may have this done every 1-3 years.  Bone density scan. This is done to screen for osteoporosis. You may have this done starting at age 18.  Mammogram. This may be done every 1-2 years. Talk to your health care provider about how often you should have regular mammograms. Talk with your health care provider about your test results, treatment options, and if necessary, the need for more tests. Vaccines  Your health care provider may recommend certain vaccines, such as:  Influenza vaccine. This is recommended every year.  Tetanus, diphtheria, and acellular pertussis (Tdap, Td) vaccine. You may need a Td booster every 10 years.  Zoster vaccine. You may need this after age  74.  Pneumococcal 13-valent conjugate (PCV13) vaccine. One dose is recommended after age 19.  Pneumococcal polysaccharide (PPSV23) vaccine. One dose is recommended after age 2. Talk to your health care provider about which screenings and vaccines you need and how often you need them. This information is not intended to replace advice given to you by your health care provider. Make sure you discuss any questions you have with your health care provider. Document Released: 07/07/2015 Document Revised: 02/28/2016 Document Reviewed: 04/11/2015 Elsevier Interactive Patient Education  2017 [employer] Prevention in the Home Falls can cause injuries. They can happen to people of all ages. There are many things you can do to make your home safe and to help prevent falls. What can I do on the outside of my home?  Regularly fix the edges of walkways and driveways and fix any cracks.  Remove anything that might make you trip as you walk through a door, such as a raised step or threshold.  Trim any bushes or trees on the path to your home.  Use bright outdoor lighting.  Clear any walking paths of anything that might make someone trip, such as rocks or tools.  Regularly check to see if handrails are loose or broken. Make sure that both sides of any steps have handrails.  Any raised decks and porches should have guardrails on the edges.  Have any leaves, snow, or ice cleared regularly.  Use sand or salt on walking paths during winter.  Clean up any spills in your garage right away. This includes oil or grease spills. What can I do in the bathroom?  Use night lights.  Install grab bars by the toilet and in the tub and shower. Do not use towel bars as grab bars.  Use non-skid mats or decals in the tub or shower.  If you need to sit down in the shower, use a plastic, non-slip stool.  Keep the floor dry. Clean up any water that spills on the floor as soon as it happens.  Remove  soap buildup in the tub or shower regularly.  Attach bath mats securely with double-sided non-slip rug tape.  Do not have throw rugs and other things on the floor that can make you trip. What can I do in the bedroom?  Use night lights.  Make sure that you have a light by your bed that is easy to reach.  Do not use any sheets or blankets that are too big for your bed. They should not hang down onto the floor.  Have a firm chair that has side arms. You can use this for support while you get dressed.  Do not have throw rugs and other things on the floor that can make you trip. What can I do in the kitchen?  Clean up any spills right away.  Avoid walking on wet floors.  Keep items that you use a lot in easy-to-reach places.  If you need to reach something above you, use a strong step stool that has a grab bar.  Keep electrical cords out of the way.  Do not use floor polish or wax that makes floors slippery. If you must use wax, use non-skid floor wax.  Do  not have throw rugs and other things on the floor that can make you trip. What can I do with my stairs?  Do not leave any items on the stairs.  Make sure that there are handrails on both sides of the stairs and use them. Fix handrails that are broken or loose. Make sure that handrails are as long as the stairways.  Check any carpeting to make sure that it is firmly attached to the stairs. Fix any carpet that is loose or worn.  Avoid having throw rugs at the top or bottom of the stairs. If you do have throw rugs, attach them to the floor with carpet tape.  Make sure that you have a light switch at the top of the stairs and the bottom of the stairs. If you do not have them, ask someone to add them for you. What else can I do to help prevent falls?  Wear shoes that:  Do not have high heels.  Have rubber bottoms.  Are comfortable and fit you well.  Are closed at the toe. Do not wear sandals.  If you use a  stepladder:  Make sure that it is fully opened. Do not climb a closed stepladder.  Make sure that both sides of the stepladder are locked into place.  Ask someone to hold it for you, if possible.  Clearly mark and make sure that you can see:  Any grab bars or handrails.  First and last steps.  Where the edge of each step is.  Use tools that help you move around (mobility aids) if they are needed. These include:  Canes.  Walkers.  Scooters.  Crutches.  Turn on the lights when you go into a dark area. Replace any light bulbs as soon as they burn out.  Set up your furniture so you have a clear path. Avoid moving your furniture around.  If any of your floors are uneven, fix them.  If there are any pets around you, be aware of where they are.  Review your medicines with your doctor. Some medicines can make you feel dizzy. This can increase your chance of falling. Ask your doctor what other things that you can do to help prevent falls. This information is not intended to replace advice given to you by your health care provider. Make sure you discuss any questions you have with your health care provider. Document Released: 04/06/2009 Document Revised: 11/16/2015 Document Reviewed: 07/15/2014 Elsevier Interactive Patient Education  2017 [employer].

## 2020-11-16 NOTE — Progress Notes (Signed)
Subjective:   Margaret Charles is a 70 y.o. female who presents for Medicare Annual (Subsequent) preventive examination.  Review of Systems     Cardiac Risk Factors include: advanced age (>52men, >59 women);dyslipidemia;hypertension     Objective:    Today's Vitals   11/16/20 1251  BP: 110/64  Pulse: 81  Temp: 98.7 F (37.1 C)  SpO2: 96%  Weight: 139 lb (63 kg)   Body mass index is 21.77 kg/m.  Advanced Directives 11/16/2020 09/14/2019 06/05/2018 05/06/2018 11/03/2017 09/16/2017 09/11/2017  Does Patient Have a Medical Advance Directive? Yes Yes Yes Yes Yes Yes Yes  Type of Advance Directive Healthcare Power of Attorney Living will;Healthcare Power of [place];Living will - [place];Living will [place];Living will [place];Living will  Does patient want to make changes to medical advance directive? - No - Patient declined - - - No - Patient declined -  Copy of [place] in Chart? No - copy requested No - copy requested - - Yes Yes Yes    Current Medications (verified) Outpatient Encounter Medications as of 11/16/2020  Medication Sig  . acetaminophen (TYLENOL) 325 MG tablet Take 650 mg by mouth every 6 (six) hours as needed for mild pain.  . fluticasone (FLONASE) 50 MCG/ACT nasal spray Place 1 spray into both nostrils daily.  Marland Kitchen levothyroxine (SYNTHROID) 75 MCG tablet TAKE 1 TABLET BY MOUTH EVERY DAY  . metoprolol tartrate (LOPRESSOR) 25 MG tablet TAKE 1 TABLET BY MOUTH TWICE A DAY  . Multiple Vitamins-Minerals (CENTRUM ADULTS PO) Take 1 tablet by mouth daily.  Marland Kitchen Propylene Glycol-Glycerin (SOOTHE OP) Place 1 drop into both eyes 2 (two) times daily as needed (dry eyes).  . rosuvastatin (CRESTOR) 5 MG tablet Take 1 tablet (5 mg total) by mouth daily. (Patient taking differently: Take 5 mg by mouth daily. Every other day M-W-F)  . [DISCONTINUED] TOLAK 4 % CREA Apply 1 application  topically at bedtime as needed (FOR FACE). (Patient not taking: Reported on 11/16/2020)   No facility-administered encounter medications on file as of 11/16/2020.    Allergies (verified) Codeine and Quinolones   History: Past Medical History:  Diagnosis Date  . Arthritis    ddd  . Chronic headaches    imprved recently per patient   . Emphysema of lung ([place])    asymptomatic. noted on CT  . Family history of colon cancer   . History of adenomatous polyp of colon    2015 precancerous polyp history- due spring 2020.  5 years  . History of seizures    1980s few ??? seizure activity  (vagal response)  . Hypertension   . Lung cancer ([place]) dx'd 07/2017  . Palpitations   . RBBB (right bundle branch block)    palpitations  . Skin cancer    skin basal cell cancer   Past Surgical History:  Procedure Laterality Date  . BLADDER SURGERY  1985   Long term incontinence. [facility]- used small intestine to build bladder  . BREAST BIOPSY  2001  . CATARACT EXTRACTION W/ INTRAOCULAR LENS  IMPLANT, BILATERAL    . Intestinal blockage  1989 and 1990   bowel obstruction surgery  . Lung cancer removal   08/2017   Right lower lobe  . VIDEO ASSISTED THORACOSCOPY (VATS)/WEDGE RESECTION Right 09/16/2017   Procedure: VIDEO ASSISTED THORACOSCOPY (VATS)/LUNG RESECTION;  Surgeon: [name], MD;  Location: [place];  Service: Thoracic;  Laterality: Right;  .  VIDEO BRONCHOSCOPY N/A 09/16/2017   Procedure: VIDEO BRONCHOSCOPY;  Surgeon: [name], MD;  Location[facility];  Service: Thoracic;  Laterality: N/A;  . WRIST SURGERY     de quervains tenosynovitis. Dr. Maureen Ralphs actually did this.    Family History  Problem Relation Age of Onset  . Dementia Mother   . Heart attack Mother        age 63. after hip fracture.   . Hypertension Mother   . Prostate cancer Father   . Hypertension Father   . Breast cancer Sister   . Hypertension Sister   . Heart attack Sister        33. former smoker  . Heart  attack Paternal Grandfather   . Hypertension Sister   . Colon cancer Other        family History  . Colon polyps Other        family history   Social History   Socioeconomic History  . Marital status: Single    Spouse name: Not on file  . Number of children: Not on file  . Years of education: Not on file  . Highest education level: Not on file  Occupational History  . Occupation: Retired Pharmacist, hospital  Tobacco Use  . Smoking status: Former Smoker    Packs/day: 1.00    Years: 40.00    Pack years: 40.00    Types: Cigarettes    Quit date: 12/03/2008    Years since quitting: 11.9  . Smokeless tobacco: Never Used  Vaping Use  . Vaping Use: Never used  Substance and Sexual Activity  . Alcohol use: Not Currently    Comment: hx  etoh  37 yrs  . Drug use: Never  . Sexual activity: Not Currently  Other Topics Concern  . Not on file  Social History Narrative   Single. Lives alone.    Lived in [place] for some years to help with parents but otherwise in [place] most of life.       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 Difficulty of Paying Living Expenses: Not hard at all  Food Insecurity: No Food Insecurity  . Worried About Charity fundraiser in the Last Year: Never true  . Ran Out of Food in the Last Year: Never true  Transportation Needs: No Transportation Needs  . Lack of Transportation (Medical): No  . Lack of Transportation (Non-Medical): No  Physical Activity: Sufficiently Active  . Days of Exercise per Week: 5 days  . Minutes of Exercise per Session: 60 min  Stress: No Stress Concern Present  . Feeling of Stress : Not at all  Social Connections: Moderately Integrated  . Frequency of Communication with Friends and Family: More than three  times a week  . Frequency of Social Gatherings with Friends and Family: More than three times a week  . Attends Religious Services: More than 4 times per year  . Active Member of Clubs or Organizations: Yes  . Attends Archivist Meetings: 1 to 4 times per year  . Marital Status: Never married    Tobacco Counseling Counseling given: Not Answered   Clinical Intake:  Pre-visit preparation completed: Yes  Pain : No/denies pain     BMI -  recorded: 21.77 Nutritional Risks: None Diabetes: No     Diabetic?no  Interpreter Needed?: No  Information entered by :: Charlott Rakes, LPN   Activities of Daily Living In your present state of health, do you have any difficulty performing the following activities: 11/16/2020  Hearing? N  Vision? N  Difficulty concentrating or making decisions? N  Walking or climbing stairs? N  Dressing or bathing? N  Doing errands, shopping? N  Preparing Food and eating ? N  Using the Toilet? N  In the past six months, have you accidently leaked urine? N  Do you have problems with loss of bowel control? N  Managing your Medications? N  Managing your Finances? N  Housekeeping or managing your Housekeeping? N  Some recent data might be hidden    Patient Care Team: [name], MD as PCP - General (Family Medicine) Nahser, Wonda Cheng, MD as PCP - Cardiology (Cardiology) [name], MD (Inactive) as Consulting Physician (Cardiothoracic Surgery) Curt Bears, MD as Consulting Physician (Oncology) Ranell Patrick Clide Deutscher, MD as Consulting Physician (Physical Medicine and Rehabilitation) Armbruster, Carlota Raspberry, MD as Consulting Physician (Gastroenterology) Danice Goltz, MD as Consulting Physician (Ophthalmology) Renato Shin, MD as Consulting Physician (Endocrinology)  Indicate any recent Medical Services you may have received from other than [facility] providers in the past year (date may be approximate).     Assessment:    This is a routine wellness examination for Taisa.  Hearing/Vision screen  Hearing Screening   125Hz  250Hz  500Hz  1000Hz  2000Hz  3000Hz  4000Hz  6000Hz  8000Hz   Right ear:           Left ear:           Comments: Pt denies any hearing   Vision Screening Comments: Pt will follow up for annual eye exams with Dr Manuella Ghazi  Dietary issues and exercise activities discussed: Current Exercise Habits: Home exercise routine, Type of exercise: walking;Other - see comments (water aerobics), Time (Minutes): > 60, Frequency (Times/Week): 5, Weekly Exercise (Minutes/Week): 0  Goals Addressed            This Visit's Progress   . Patient Stated       None at this time      Depression Screen PHQ 2/9 Scores 11/16/2020 11/18/2019 10/15/2019 09/14/2019 03/26/2019 02/18/2019 08/27/2018  PHQ - 2 Score 0 0 0 0 0 0 0    Fall Risk Fall Risk  11/16/2020 11/18/2019 10/15/2019 09/14/2019 03/26/2019  Falls in the past year? 1 0 0 0 0  Number falls in past yr: 1 - - 0 0  Injury with Fall? 1 - - 0 0  Comment bruised ribs - - - -  Follow up Falls prevention discussed - - Falls evaluation completed;Education provided;Falls prevention discussed -    FALL RISK PREVENTION PERTAINING TO THE HOME:  Any stairs in or around the home? Yes  If so, are there any without handrails? Yes  Home free of loose throw rugs in walkways, pet beds, electrical cords, etc? Yes  Adequate lighting in your home to reduce risk of falls? Yes   ASSISTIVE DEVICES UTILIZED TO PREVENT FALLS:  Life alert? No   Apple watch Use of a cane, walker or w/c? No  Grab bars in the bathroom? No  Shower chair or bench in shower? Yes  Elevated toilet seat or a handicapped toilet? No   TIMED UP AND GO:  Was the test performed? Yes .  Length of time to ambulate 10 feet: 10 sec.  Gait steady and fast without use of assistive device  Cognitive Function:     6CIT Screen 11/16/2020 09/14/2019  What Year? 0 points 0 points  What month? 0 points 0 points   What time? 0 points 0 points  Count back from 20 0 points 0 points  Months in reverse 0 points 0 points  Repeat phrase 0 points 0 points  Total Score 0 0    Immunizations Immunization History  Administered Date(s) Administered  . Fluad Quad(high Dose 65+) 03/26/2019  . Influenza Split 05/09/2011, 04/26/2012, 03/24/2014, 04/22/2014  . Influenza Whole 03/19/2017  . Influenza, High Dose Seasonal PF 04/24/2016, 03/20/2018  . Influenza, Seasonal, Injecte, Preservative Fre 04/11/2015  . Influenza-Unspecified 03/19/2017, 03/31/2017, 03/24/2020  . Moderna Sars-Covid-2 Vaccination 07/26/2019, 08/24/2019, 11/03/2020  . PPD Test 09/13/2014  . Pneumococcal Conjugate-13 06/15/2015  . Pneumococcal Polysaccharide-23 03/19/2017  . Tdap 09/15/2014  . Zoster Recombinat (Shingrix) 03/13/2018, 05/12/2019, 10/26/2019  . Zoster, Live 06/11/2013    TDAP status: Up to date  Flu Vaccine status: Up to date  Pneumococcal vaccine status: Up to date  Covid-19 vaccine status: Completed vaccines  Qualifies for Shingles Vaccine? Yes   Zostavax completed Yes   Shingrix Completed?: Yes  Screening Tests Health Maintenance  Topic Date Due  . Zoster Vaccines- Shingrix (1 of 2) 09/15/1999  . INFLUENZA VACCINE  01/22/2021  . COVID-19 Vaccine (4 - Booster for Moderna series) 02/03/2021  . COLONOSCOPY (Pts 45-20yrs Insurance coverage will need to be confirmed)  05/05/2022  . MAMMOGRAM  08/21/2022  . TETANUS/TDAP  09/14/2024  . DEXA SCAN  Completed  . Hepatitis C Screening  Completed  . PNA vac Low Risk Adult  Completed  . HPV VACCINES  Aged Out    Health Maintenance  Health Maintenance Due  Topic Date Due  . Zoster Vaccines- Shingrix (1 of 2) 09/15/1999    Colorectal cancer screening: Type of screening: Colonoscopy. Completed 05/06/19. Repeat every 3 years  Mammogram status: Completed 08/21/20. Repeat every year  Bone Density status: Completed 04/10/20. Results reflect: Bone density results:  OSTEOPENIA. Repeat every 0 years.  Additional Screening:  Hepatitis C Screening:  Completed 02/20/16  Vision Screening: Recommended annual ophthalmology exams for early detection of glaucoma and other disorders of the eye. Is the patient up to date with their annual eye exam?  Yes  Who is the provider or what is the name of the office in which the patient attends annual eye exams? Dr Manuella Ghazi If pt is not established with a provider, would they like to be referred to a provider to establish care? No .   Dental Screening: Recommended annual dental exams for proper oral hygiene  Community Resource Referral / Chronic Care Management: CRR required this visit?  No   CCM required this visit?  No      Plan:     I have personally reviewed and noted the following in the patient's chart:   . Medical and social history . Use of alcohol, tobacco or illicit drugs  . Current medications and supplements including opioid prescriptions.  . Functional ability and status . Nutritional status . Physical activity . Advanced directives . List of other physicians . Hospitalizations, surgeries, and ER visits in previous 12 months . Vitals . Screenings to include cognitive, depression, and falls . Referrals and appointments  In addition, I have reviewed and discussed with patient certain preventive protocols, quality metrics, and best practice recommendations. A written personalized care plan for preventive services as well as general  preventive health recommendations were provided to patient.     Willette Brace, LPN   6/96/2952   Nurse Notes: None

## 2020-11-17 ENCOUNTER — Ambulatory Visit: Payer: Medicare PPO

## 2020-12-01 ENCOUNTER — Other Ambulatory Visit: Payer: Self-pay | Admitting: Physician Assistant

## 2020-12-05 ENCOUNTER — Other Ambulatory Visit: Payer: Medicare PPO

## 2020-12-06 ENCOUNTER — Ambulatory Visit (HOSPITAL_COMMUNITY)
Admission: RE | Admit: 2020-12-06 | Discharge: 2020-12-06 | Disposition: A | Discharge status: Home / Self Care | Payer: Medicare PPO | Source: Ambulatory Visit | Attending: Internal Medicine | Admitting: Internal Medicine

## 2020-12-06 ENCOUNTER — Inpatient Hospital Stay: Payer: Medicare PPO | Attending: Internal Medicine

## 2020-12-06 ENCOUNTER — Other Ambulatory Visit: Payer: Self-pay

## 2020-12-06 DIAGNOSIS — J432 Centrilobular emphysema: Secondary | ICD-10-CM | POA: Diagnosis not present

## 2020-12-06 DIAGNOSIS — I2510 Atherosclerotic heart disease of native coronary artery without angina pectoris: Secondary | ICD-10-CM | POA: Diagnosis not present

## 2020-12-06 DIAGNOSIS — C3431 Malignant neoplasm of lower lobe, right bronchus or lung: Secondary | ICD-10-CM | POA: Diagnosis not present

## 2020-12-06 DIAGNOSIS — C3490 Malignant neoplasm of unspecified part of unspecified bronchus or lung: Secondary | ICD-10-CM | POA: Insufficient documentation

## 2020-12-06 DIAGNOSIS — I712 Thoracic aortic aneurysm, without rupture: Secondary | ICD-10-CM | POA: Insufficient documentation

## 2020-12-06 LAB — CBC WITH DIFFERENTIAL (CANCER CENTER ONLY)
Abs Immature Granulocytes: 0.03 10*3/uL (ref 0.00–0.07)
Basophils Absolute: 0.1 10*3/uL (ref 0.0–0.1)
Basophils Relative: 1 %
Eosinophils Absolute: 0.2 10*3/uL (ref 0.0–0.5)
Eosinophils Relative: 2 %
HCT: 37.6 % (ref 36.0–46.0)
Hemoglobin: 12.7 g/dL (ref 12.0–15.0)
Immature Granulocytes: 0 %
Lymphocytes Relative: 26 %
Lymphs Abs: 2.6 10*3/uL (ref 0.7–4.0)
MCH: 30.1 pg (ref 26.0–34.0)
MCHC: 33.8 g/dL (ref 30.0–36.0)
MCV: 89.1 fL (ref 80.0–100.0)
Monocytes Absolute: 0.9 10*3/uL (ref 0.1–1.0)
Monocytes Relative: 9 %
Neutro Abs: 6.1 10*3/uL (ref 1.7–7.7)
Neutrophils Relative %: 62 %
Platelet Count: 275 10*3/uL (ref 150–400)
RBC: 4.22 MIL/uL (ref 3.87–5.11)
RDW: 12.8 % (ref 11.5–15.5)
WBC Count: 9.9 10*3/uL (ref 4.0–10.5)
nRBC: 0 % (ref 0.0–0.2)

## 2020-12-06 LAB — CMP (CANCER CENTER ONLY)
ALT: 20 U/L (ref 0–44)
AST: 17 U/L (ref 15–41)
Albumin: 3.5 g/dL (ref 3.5–5.0)
Alkaline Phosphatase: 107 U/L (ref 38–126)
Anion gap: 6 (ref 5–15)
BUN: 13 mg/dL (ref 8–23)
CO2: 27 mmol/L (ref 22–32)
Calcium: 9.4 mg/dL (ref 8.9–10.3)
Chloride: 105 mmol/L (ref 98–111)
Creatinine: 0.75 mg/dL (ref 0.44–1.00)
GFR, Estimated: >60 mL/min (ref >60)
Glucose, Bld: 77 mg/dL (ref 70–99)
Potassium: 4.9 mmol/L (ref 3.5–5.1)
Sodium: 138 mmol/L (ref 135–145)
Total Bilirubin: 0.5 mg/dL (ref 0.3–1.2)
Total Protein: 6.9 g/dL (ref 6.5–8.1)

## 2020-12-06 MED ORDER — SODIUM CHLORIDE (PF) 0.9 % IJ SOLN
INTRAMUSCULAR | Status: AC
  Filled 2020-12-06: qty 50

## 2020-12-06 MED ORDER — IOHEXOL 300 MG/ML  SOLN
75.0000 mL | Freq: Once | INTRAMUSCULAR | Status: AC | PRN
  Administered 2020-12-06: 11:00:00 75 mL via INTRAVENOUS

## 2020-12-07 ENCOUNTER — Ambulatory Visit: Payer: Medicare PPO | Admitting: Internal Medicine

## 2020-12-12 ENCOUNTER — Other Ambulatory Visit: Payer: Medicare PPO

## 2020-12-14 ENCOUNTER — Inpatient Hospital Stay: Payer: Medicare PPO | Admitting: Internal Medicine

## 2020-12-14 ENCOUNTER — Other Ambulatory Visit: Payer: Self-pay

## 2020-12-14 VITALS — BP 123/70 | HR 68 | Temp 97.8°F | Resp 17 | Ht 67.0 in | Wt 141.1 lb

## 2020-12-14 DIAGNOSIS — I712 Thoracic aortic aneurysm, without rupture: Secondary | ICD-10-CM | POA: Diagnosis not present

## 2020-12-14 DIAGNOSIS — C3490 Malignant neoplasm of unspecified part of unspecified bronchus or lung: Secondary | ICD-10-CM

## 2020-12-14 DIAGNOSIS — C3431 Malignant neoplasm of lower lobe, right bronchus or lung: Secondary | ICD-10-CM | POA: Diagnosis not present

## 2020-12-14 NOTE — Progress Notes (Signed)
[facility] Telephone:(336) [phone]   Fax:(336) [phone]  OFFICE PROGRESS NOTE  [name], MD [place] [address]  DIAGNOSIS: Stage IA (T1b, N0, M0) non-small cell lung cancer, squamous cell carcinoma diagnosed in March 2019.  PRIOR THERAPY:  Status post right lower lobectomy with lymph node dissection under the care of Dr. Servando Snare on September 16, 2017.  CURRENT THERAPY: Observation.  INTERVAL HISTORY: Margaret Charles 70 y.o. female returns to the clinic today for annual follow-up visit.  The patient is feeling fine today with no concerning complaints except for occasional right-sided chest pain from the surgical scar.  She denied having any current shortness of breath, cough or hemoptysis.  She denied having any fever or chills.  She has no nausea, vomiting, diarrhea or constipation.  She denied having any headache or visual changes.  She has no weight loss or night sweats.  The patient had repeat CT scan of the chest performed recently and she is here for evaluation and discussion of her scan results.   MEDICAL HISTORY: Past Medical History:  Diagnosis Date   Arthritis    ddd   Chronic headaches    imprved recently per patient    Emphysema of lung ([place])    asymptomatic. noted on CT   Family history of colon cancer    History of adenomatous polyp of colon    2015 precancerous polyp history- due spring 2020.  5 years   History of seizures    1980s few ??? seizure activity  (vagal response)   Hypertension    Lung cancer ([place]) dx'd 07/2017   Palpitations    RBBB (right bundle branch block)    palpitations   Skin cancer    skin basal cell cancer    ALLERGIES:  is allergic to codeine and quinolones.  MEDICATIONS:  Current Outpatient Medications  Medication Sig Dispense Refill   acetaminophen (TYLENOL) 325 MG tablet Take 650 mg by mouth every 6 (six) hours as needed for mild pain.     fluticasone (FLONASE) 50 MCG/ACT nasal spray  Place 1 spray into both nostrils daily.     levothyroxine (SYNTHROID) 75 MCG tablet TAKE 1 TABLET BY MOUTH EVERY DAY 90 tablet 3   metoprolol tartrate (LOPRESSOR) 25 MG tablet TAKE 1 TABLET BY MOUTH TWICE A DAY 180 tablet 2   Multiple Vitamins-Minerals (CENTRUM ADULTS PO) Take 1 tablet by mouth daily.     Propylene Glycol-Glycerin (SOOTHE OP) Place 1 drop into both eyes 2 (two) times daily as needed (dry eyes).     rosuvastatin (CRESTOR) 5 MG tablet TAKE 1 TABLET (5 MG TOTAL) BY MOUTH DAILY. 90 tablet 1   No current facility-administered medications for this visit.    SURGICAL HISTORY:  Past Surgical History:  Procedure Laterality Date   BLADDER SURGERY  1985   Long term incontinence. [facility]- used small intestine to build bladder   BREAST BIOPSY  2001   CATARACT EXTRACTION W/ INTRAOCULAR LENS  IMPLANT, BILATERAL     Intestinal blockage  1989 and 1990   bowel obstruction surgery   Lung cancer removal   08/2017   Right lower lobe   VIDEO ASSISTED THORACOSCOPY (VATS)/WEDGE RESECTION Right 09/16/2017   Procedure: VIDEO ASSISTED THORACOSCOPY (VATS)/LUNG RESECTION;  Surgeon: [name], MD;  Location: [facility];  Service: Thoracic;  Laterality: Right;   VIDEO BRONCHOSCOPY N/A 09/16/2017   Procedure: VIDEO BRONCHOSCOPY;  Surgeon: [name], MD;  Location: [facility]  OR;  Service: Thoracic;  Laterality: N/A;   WRIST SURGERY     de quervains tenosynovitis. Dr. Maureen Ralphs actually did this.     REVIEW OF SYSTEMS:  A comprehensive review of systems was negative except for: Respiratory: positive for pleurisy/chest pain   PHYSICAL EXAMINATION: General appearance: alert, cooperative, and no distress Head: Normocephalic, without obvious abnormality, atraumatic Neck: no adenopathy, no JVD, supple, symmetrical, trachea midline, and thyroid not enlarged, symmetric, no tenderness/mass/nodules Lymph nodes: Cervical, supraclavicular, and axillary nodes normal. Resp: clear to auscultation  bilaterally Back: symmetric, no curvature. ROM normal. No CVA tenderness. Cardio: regular rate and rhythm, S1, S2 normal, no murmur, click, rub or gallop GI: soft, non-tender; bowel sounds normal; no masses,  no organomegaly Extremities: extremities normal, atraumatic, no cyanosis or edema  ECOG PERFORMANCE STATUS: 1 - Symptomatic but completely ambulatory  Blood pressure 123/70, pulse 68, temperature 97.8 F (36.6 C), temperature source Tympanic, resp. rate 17, height 5\' 7"  (1.702 m), weight 141 lb 1.6 oz (64 kg), SpO2 98 %.  LABORATORY DATA: Lab Results  Component Value Date   WBC 9.9 12/06/2020   HGB 12.7 12/06/2020   HCT 37.6 12/06/2020   MCV 89.1 12/06/2020   PLT 275 12/06/2020      Chemistry      Component Value Date/Time   NA 138 12/06/2020 0852   K 4.9 12/06/2020 0852   CL 105 12/06/2020 0852   CO2 27 12/06/2020 0852   BUN 13 12/06/2020 0852   CREATININE 0.75 12/06/2020 0852   CREATININE 0.81 03/30/2020 0920      Component Value Date/Time   CALCIUM 9.4 12/06/2020 0852   ALKPHOS 107 12/06/2020 0852   AST 17 12/06/2020 0852   ALT 20 12/06/2020 0852   BILITOT 0.5 12/06/2020 0852       RADIOGRAPHIC STUDIES: CT Chest W Contrast  Result Date: 12/06/2020 CLINICAL DATA:  Non-small cell lung cancer staging, status post right lower lobectomy EXAM: CT CHEST WITH CONTRAST TECHNIQUE: Multidetector CT imaging of the chest was performed during intravenous contrast administration. CONTRAST:  12mL OMNIPAQUE IOHEXOL 300 MG/ML  SOLN COMPARISON:  12/06/2019 FINDINGS: Cardiovascular: Aortic atherosclerosis. Unchanged enlargement of the tubular ascending thoracic aorta, measuring up to 4.0 x 4.0 cm. Normal heart size. Left and right coronary artery calcifications. No pericardial effusion. Mediastinum/Nodes: No enlarged mediastinal, hilar, or axillary lymph nodes. Thyroid gland, trachea, and esophagus demonstrate no significant findings. Lungs/Pleura: Redemonstrated postoperative  findings status post right lower lobectomy. Mild centrilobular emphysema. Fine centrilobular nodularity throughout the lungs. Trace, chronic, loculated postoperative right pleural effusion. Upper Abdomen: No acute abnormality. Musculoskeletal: No chest wall mass or suspicious bone lesions identified. IMPRESSION: 1. Redemonstrated postoperative findings status post right lower lobectomy. No evidence of recurrent or metastatic disease in the chest. 2. Fine centrilobular nodularity throughout the lungs, most commonly seen in smoking-related respiratory bronchiolitis. 3. Emphysema. 4. Unchanged enlargement of the tubular ascending thoracic aorta, measuring up to 4.0 x 4.0 cm. Recommend annual imaging followup by CTA or MRA. This recommendation follows 2010 ACCF/AHA/AATS/ACR/ASA/SCA/SCAI/SIR/STS/SVM Guidelines for the Diagnosis and Management of Patients with Thoracic Aortic Disease. Circulation. 2010; 121: G269-S854. Aortic aneurysm NOS (ICD10-I71.9) 5. Coronary artery disease. Aortic Atherosclerosis (ICD10-I70.0) and Emphysema (ICD10-J43.9). Electronically Signed   By: Eddie Candle M.D.   On: 12/06/2020 15:41     ASSESSMENT AND PLAN: This is a very pleasant 70 years old white female with a stage Ia non-small cell lung cancer status post right lower lobectomy with lymph node dissection in March 2019 under  the care of Dr. Servando Snare.   She has been on observation since her surgery and she has been doing fine with no concerning complaints. She had repeat CT scan of the chest performed recently.  I personally and independently reviewed the scan and discussed the results with the patient today. Her scan showed no concerning findings for disease recurrence or metastasis.  She continues to have the enlarged with ascending thoracic aorta and she will be seen by Dr. Roxan Hockey for further evaluation. I recommended for the patient to continue on observation with repeat CT scan of the chest in 1 year. She was advised to  call immediately if she has any other concerning symptoms in the interval. The patient voices understanding of current disease status and treatment options and is in agreement with the current care plan.  All questions were answered. The patient knows to call the clinic with any problems, questions or concerns. We can certainly see the patient much sooner if necessary.   Disclaimer: This note was dictated with voice recognition software. Similar sounding words can inadvertently be transcribed and may not be corrected upon review.

## 2020-12-19 ENCOUNTER — Telehealth: Payer: Self-pay | Admitting: Internal Medicine

## 2020-12-19 ENCOUNTER — Other Ambulatory Visit: Payer: Self-pay

## 2020-12-19 ENCOUNTER — Encounter: Payer: Self-pay | Admitting: Thoracic Surgery (Cardiothoracic Vascular Surgery)

## 2020-12-19 ENCOUNTER — Ambulatory Visit: Payer: Medicare PPO | Admitting: Thoracic Surgery (Cardiothoracic Vascular Surgery)

## 2020-12-19 VITALS — BP 129/78 | HR 78 | Resp 20 | Ht 67.0 in | Wt 141.0 lb

## 2020-12-19 DIAGNOSIS — Z85118 Personal history of other malignant neoplasm of bronchus and lung: Secondary | ICD-10-CM

## 2020-12-19 DIAGNOSIS — I712 Thoracic aortic aneurysm, without rupture: Secondary | ICD-10-CM | POA: Diagnosis not present

## 2020-12-19 DIAGNOSIS — I7121 Aneurysm of the ascending aorta, without rupture: Secondary | ICD-10-CM

## 2020-12-19 NOTE — Telephone Encounter (Signed)
Scheduled per los. Mailed printout  °

## 2020-12-19 NOTE — Progress Notes (Signed)
[address]       ,[place] [address]             [phone]     HPI: [name] returns for follow-up after her lobectomy in 2019 and regarding an ascending aortic aneurysm.  Margaret Charles is a 70 year old woman with a past medical history significant for tobacco abuse (quit 2010), stage Ia non-small cell carcinoma right lower lobe, postthoracotomy intercostal neuralgia, arthritis, hypertension, right bundle branch block, and skin cancer.  Dr. Servando Snare did a right lower lobectomy for a stage Ia non-small cell carcinoma in 2019.  She did not require adjuvant therapy.  She is been followed since then.  On one of her follow-up scan she was noted to have a 4.1 cm ascending aneurysm.  Overall she has been feeling well.  She is not having any anginal type chest pain, pressure, or tightness.  She is not having any shortness of breath that is out of the ordinary.  She is followed by Dr. Acie Fredrickson from cardiology.  She does complain of pain along the right costal margin and in the right upper quadrant.  She says this pain gets very severe when she is active.  It does not stop her but it does affect her with her activities.  She has seen his pain specialist and multiple medications were tried.  However she is very sensitive to those and cannot tolerate any of them.  Nerve blocks were recommended that she is concerned about not having them.  Past Medical History:  Diagnosis Date   Arthritis    ddd   Chronic headaches    imprved recently per patient    Emphysema of lung ([place])    asymptomatic. noted on CT   Family history of colon cancer    History of adenomatous polyp of colon    2015 precancerous polyp history- due spring 2020.  5 years   History of seizures    1980s few ??? seizure activity  (vagal response)   Hypertension    Lung cancer ([place]) dx'd 07/2017   Palpitations    RBBB (right bundle branch block)    palpitations   Skin cancer    skin basal cell cancer   Past  Surgical History:  Procedure Laterality Date   BLADDER SURGERY  1985   Long term incontinence. [facility]- used small intestine to build bladder   BREAST BIOPSY  2001   CATARACT EXTRACTION W/ INTRAOCULAR LENS  IMPLANT, BILATERAL     Intestinal blockage  1989 and 1990   bowel obstruction surgery   Lung cancer removal   08/2017   Right lower lobe   VIDEO ASSISTED THORACOSCOPY (VATS)/WEDGE RESECTION Right 09/16/2017   Procedure: VIDEO ASSISTED THORACOSCOPY (VATS)/LUNG RESECTION;  Surgeon: [name], MD;  Location: [facility];  Service: Thoracic;  Laterality: Right;   VIDEO BRONCHOSCOPY N/A 09/16/2017   Procedure: VIDEO BRONCHOSCOPY;  Surgeon: [name], MD;  Location: [facility];  Service: Thoracic;  Laterality: N/A;   WRIST SURGERY     de quervains tenosynovitis. Dr. Maureen Ralphs actually did this.     Current Outpatient Medications  Medication Sig Dispense Refill   acetaminophen (TYLENOL) 325 MG tablet Take 650 mg by mouth every 6 (six) hours as needed for mild pain.     fluticasone (FLONASE) 50 MCG/ACT nasal spray Place 1 spray into both nostrils daily.     levothyroxine (SYNTHROID) 75 MCG tablet TAKE 1 TABLET BY MOUTH EVERY DAY 90 tablet  3   metoprolol tartrate (LOPRESSOR) 25 MG tablet TAKE 1 TABLET BY MOUTH TWICE A DAY 180 tablet 2   Multiple Vitamins-Minerals (CENTRUM ADULTS PO) Take 1 tablet by mouth daily.     Propylene Glycol-Glycerin (SOOTHE OP) Place 1 drop into both eyes 2 (two) times daily as needed (dry eyes).     rosuvastatin (CRESTOR) 5 MG tablet TAKE 1 TABLET (5 MG TOTAL) BY MOUTH DAILY. (Patient taking differently: Take 5 mg by mouth 2 (two) times a week.) 90 tablet 1   No current facility-administered medications for this visit.    Physical Exam BP 129/78   Pulse 78   Resp 20   Ht 5\' 7"  (1.702 m)   Wt 141 lb (64 kg)   SpO2 97% Comment: RA  BMI 22.20 kg/m  70 year old woman in no acute distress Alert and oriented x3 with no focal deficits Incisions well-healed No  cervical supraclavicular adenopathy, no carotid bruits Cardiac regular rate and rhythm, no murmur Lungs diminished at right base but otherwise clear  Diagnostic Tests: CT CHEST WITH CONTRAST   TECHNIQUE: Multidetector CT imaging of the chest was performed during intravenous contrast administration.   CONTRAST:  107mL OMNIPAQUE IOHEXOL 300 MG/ML  SOLN   COMPARISON:  12/06/2019   FINDINGS: Cardiovascular: Aortic atherosclerosis. Unchanged enlargement of the tubular ascending thoracic aorta, measuring up to 4.0 x 4.0 cm. Normal heart size. Left and right coronary artery calcifications. No pericardial effusion.   Mediastinum/Nodes: No enlarged mediastinal, hilar, or axillary lymph nodes. Thyroid gland, trachea, and esophagus demonstrate no significant findings.   Lungs/Pleura: Redemonstrated postoperative findings status post right lower lobectomy. Mild centrilobular emphysema. Fine centrilobular nodularity throughout the lungs. Trace, chronic, loculated postoperative right pleural effusion.   Upper Abdomen: No acute abnormality.   Musculoskeletal: No chest wall mass or suspicious bone lesions identified.   IMPRESSION: 1. Redemonstrated postoperative findings status post right lower lobectomy. No evidence of recurrent or metastatic disease in the chest. 2. Fine centrilobular nodularity throughout the lungs, most commonly seen in smoking-related respiratory bronchiolitis. 3. Emphysema. 4. Unchanged enlargement of the tubular ascending thoracic aorta, measuring up to 4.0 x 4.0 cm. Recommend annual imaging followup by CTA or MRA. This recommendation follows 2010 ACCF/AHA/AATS/ACR/ASA/SCA/SCAI/SIR/STS/SVM Guidelines for the Diagnosis and Management of Patients with Thoracic Aortic Disease. Circulation. 2010; 121: A540-J811. Aortic aneurysm NOS (ICD10-I71.9) 5. Coronary artery disease.   Aortic Atherosclerosis (ICD10-I70.0) and Emphysema (ICD10-J43.9).     Electronically  Signed   By: Eddie Candle M.D.   On: 12/06/2020 15:41 I personally reviewed the CT images.  There is no change in a 4.1 cm ascending aneurysm.  Postoperative changes on the right with no evidence of suspicious lung nodules or adenopathy.  Impression: Margaret Charles is a 70 year old woman with a past medical history significant for tobacco abuse (quit 2010), stage Ia non-small cell carcinoma right lower lobe, postthoracotomy intercostal neuralgia, arthritis, hypertension, right bundle branch block, and skin cancer.  Ascending aneurysm-stable at about 4.1 cm.  Needs continued annual follow-up.  Importance of blood pressure control was emphasized.  Stage Ia non-small cell carcinoma-3 years out from right lower lobectomy.  No evidence of recurrent disease.  We will continue to be followed by Dr. Julien Nordmann.  Postthoracotomy intercostal neuralgia-continues to affect her significantly.  She asked about nerve blocks that she cannot tolerate any of the medications the pain specialist recommended.  I think it is worth trying the nerve blocks but she needs to understand there is no guarantee that that will work  either.  Plan: Follow-up with pain specialist Follow-up with Dr. Julien Nordmann Return in 1 year after CT  Melrose Nakayama, MD [facility] [phone]

## 2020-12-22 ENCOUNTER — Telehealth: Payer: Self-pay | Admitting: Physical Medicine and Rehabilitation

## 2020-12-22 NOTE — Telephone Encounter (Signed)
Pt would like to be referred out for the nerve block

## 2020-12-25 ENCOUNTER — Other Ambulatory Visit: Payer: Self-pay | Admitting: Physical Medicine and Rehabilitation

## 2020-12-25 DIAGNOSIS — G588 Other specified mononeuropathies: Secondary | ICD-10-CM

## 2021-01-03 DIAGNOSIS — M21612 Bunion of left foot: Secondary | ICD-10-CM | POA: Diagnosis not present

## 2021-01-03 DIAGNOSIS — M21622 Bunionette of left foot: Secondary | ICD-10-CM | POA: Diagnosis not present

## 2021-01-03 DIAGNOSIS — M21611 Bunion of right foot: Secondary | ICD-10-CM | POA: Diagnosis not present

## 2021-01-03 DIAGNOSIS — M21621 Bunionette of right foot: Secondary | ICD-10-CM | POA: Diagnosis not present

## 2021-01-10 DIAGNOSIS — C3431 Malignant neoplasm of lower lobe, right bronchus or lung: Secondary | ICD-10-CM | POA: Diagnosis not present

## 2021-01-10 DIAGNOSIS — Z902 Acquired absence of lung [part of]: Secondary | ICD-10-CM | POA: Diagnosis not present

## 2021-01-10 DIAGNOSIS — G588 Other specified mononeuropathies: Secondary | ICD-10-CM | POA: Diagnosis not present

## 2021-01-30 DIAGNOSIS — G588 Other specified mononeuropathies: Secondary | ICD-10-CM | POA: Diagnosis not present

## 2021-01-30 DIAGNOSIS — Z902 Acquired absence of lung [part of]: Secondary | ICD-10-CM | POA: Diagnosis not present

## 2021-02-15 DIAGNOSIS — G588 Other specified mononeuropathies: Secondary | ICD-10-CM | POA: Diagnosis not present

## 2021-03-07 ENCOUNTER — Telehealth: Payer: Self-pay

## 2021-03-07 ENCOUNTER — Other Ambulatory Visit: Payer: Self-pay

## 2021-03-07 DIAGNOSIS — Z0000 Encounter for general adult medical examination without abnormal findings: Secondary | ICD-10-CM

## 2021-03-07 DIAGNOSIS — I10 Essential (primary) hypertension: Secondary | ICD-10-CM

## 2021-03-07 DIAGNOSIS — E039 Hypothyroidism, unspecified: Secondary | ICD-10-CM

## 2021-03-07 NOTE — Telephone Encounter (Signed)
Labs ordered, ok to schedule lab visit prior.

## 2021-03-07 NOTE — Telephone Encounter (Signed)
Pt called about her physical. Pt wants to know if she can come in a week before her appt to do the labs. Her physical is 04/02/21 but she doesn't want to take the time to do them on the same day. Can we schedule them for the week before? Please Advise.

## 2021-03-08 NOTE — Telephone Encounter (Signed)
Pt is scheduled for labs.

## 2021-03-14 DIAGNOSIS — G588 Other specified mononeuropathies: Secondary | ICD-10-CM | POA: Diagnosis not present

## 2021-03-14 DIAGNOSIS — Z902 Acquired absence of lung [part of]: Secondary | ICD-10-CM | POA: Diagnosis not present

## 2021-03-14 DIAGNOSIS — M5134 Other intervertebral disc degeneration, thoracic region: Secondary | ICD-10-CM | POA: Diagnosis not present

## 2021-03-19 ENCOUNTER — Other Ambulatory Visit: Payer: Self-pay | Admitting: Endocrinology

## 2021-03-26 ENCOUNTER — Other Ambulatory Visit (INDEPENDENT_AMBULATORY_CARE_PROVIDER_SITE_OTHER): Payer: Medicare PPO

## 2021-03-26 ENCOUNTER — Other Ambulatory Visit: Payer: Self-pay | Admitting: Endocrinology

## 2021-03-26 ENCOUNTER — Other Ambulatory Visit: Payer: Self-pay

## 2021-03-26 DIAGNOSIS — I10 Essential (primary) hypertension: Secondary | ICD-10-CM

## 2021-03-26 DIAGNOSIS — Z0000 Encounter for general adult medical examination without abnormal findings: Secondary | ICD-10-CM | POA: Diagnosis not present

## 2021-03-26 DIAGNOSIS — E039 Hypothyroidism, unspecified: Secondary | ICD-10-CM | POA: Diagnosis not present

## 2021-03-26 LAB — CBC WITH DIFFERENTIAL/PLATELET
Basophils Absolute: 0.1 10*3/uL (ref 0.0–0.1)
Basophils Relative: 0.6 % (ref 0.0–3.0)
Eosinophils Absolute: 0.3 10*3/uL (ref 0.0–0.7)
Eosinophils Relative: 3.8 % (ref 0.0–5.0)
HCT: 37.1 % (ref 36.0–46.0)
Hemoglobin: 12.7 g/dL (ref 12.0–15.0)
Lymphocytes Relative: 25.4 % (ref 12.0–46.0)
Lymphs Abs: 2.2 10*3/uL (ref 0.7–4.0)
MCHC: 34.2 g/dL (ref 30.0–36.0)
MCV: 89.3 fl (ref 78.0–100.0)
Monocytes Absolute: 0.8 10*3/uL (ref 0.1–1.0)
Monocytes Relative: 9.5 % (ref 3.0–12.0)
Neutro Abs: 5.1 10*3/uL (ref 1.4–7.7)
Neutrophils Relative %: 60.7 % (ref 43.0–77.0)
Platelets: 236 10*3/uL (ref 150.0–400.0)
RBC: 4.16 Mil/uL (ref 3.87–5.11)
RDW: 13.8 % (ref 11.5–15.5)
WBC: 8.5 10*3/uL (ref 4.0–10.5)

## 2021-03-26 LAB — COMPREHENSIVE METABOLIC PANEL
ALT: 18 U/L (ref 0–35)
AST: 20 U/L (ref 0–37)
Albumin: 3.9 g/dL (ref 3.5–5.2)
Alkaline Phosphatase: 85 U/L (ref 39–117)
BUN: 12 mg/dL (ref 6–23)
CO2: 27 mEq/L (ref 19–32)
Calcium: 9.5 mg/dL (ref 8.4–10.5)
Chloride: 107 mEq/L (ref 96–112)
Creatinine, Ser: 0.77 mg/dL (ref 0.40–1.20)
GFR: 77.55 mL/min (ref >60.00)
Glucose, Bld: 86 mg/dL (ref 70–99)
Potassium: 4.9 mEq/L (ref 3.5–5.1)
Sodium: 141 mEq/L (ref 135–145)
Total Bilirubin: 0.5 mg/dL (ref 0.2–1.2)
Total Protein: 6.7 g/dL (ref 6.0–8.3)

## 2021-03-26 LAB — LIPID PANEL
Cholesterol: 152 mg/dL (ref 0–200)
HDL: 65.1 mg/dL (ref >39.00)
LDL Cholesterol: 73 mg/dL (ref 0–99)
NonHDL: 87.28
Total CHOL/HDL Ratio: 2
Triglycerides: 73 mg/dL (ref 0.0–149.0)
VLDL: 14.6 mg/dL (ref 0.0–40.0)

## 2021-03-26 LAB — TSH: TSH: 5.22 u[IU]/mL (ref 0.35–5.50)

## 2021-03-26 NOTE — Progress Notes (Signed)
Phone [phone]   Subjective:  Patient presents today for their annual physical. Chief complaint-noted.   See problem oriented charting- ROS- full  review of systems was completed and negative except for: post nasal drip, back pain/chest pain- nerve pain from surgery (injections several weeks ago with [place] neurosurgery- didn't help  nor did rx lidocaine patch), neck pain/tension, seasonal allergies, headaches related to neck pain- overall stable- tyleno helpful  The following were reviewed and entered/updated in epic: Past Medical History:  Diagnosis Date   Arthritis    ddd   Chronic headaches    imprved recently per patient    Emphysema of lung ([place])    asymptomatic. noted on CT   Family history of colon cancer    History of adenomatous polyp of colon    2015 precancerous polyp history- due spring 2020.  5 years   History of seizures    1980s few ??? seizure activity  (vagal response)   Hypertension    Lung cancer ([place]) dx'd 07/2017   Palpitations    RBBB (right bundle branch block)    palpitations   Skin cancer    skin basal cell cancer   Patient Active Problem List   Diagnosis Date Noted   Ascending aortic aneurysm 08/27/2018    Priority: 1.   History of atrial fibrillation 12/29/2017    Priority: 1.   Alcoholism in remission ([place]) 12/29/2017    Priority: 1.   Emphysema of lung ([place])     Priority: 1.   S/P lobectomy of lung 09/16/2017    Priority: 1.   History of lung cancer- right lower lobe s/p right lobectomy 08/06/2017    Priority: 1.   History of adenomatous polyp of colon 05/11/2019    Priority: 2.   Hypothyroidism 04/30/2019    Priority: 2.   Hypertension 12/29/2017    Priority: 2.   History of skin cancer 12/29/2017    Priority: 2.   Chronic headaches     Priority: 2.   Left thyroid nodule 10/07/2017    Priority: 2.   Palpitations 03/25/2017    Priority: 2.   Benign paroxysmal positional vertigo of right ear 05/25/2018    Priority: 3.    Lens replaced by other means 08/12/2017    Priority: 3.   Tear film insufficiency 08/12/2017    Priority: 3.   Vitreous degeneration 08/12/2017    Priority: 3.   Cervicogenic headache 08/11/2017    Priority: 3.   DDD (degenerative disc disease), cervical 08/11/2017    Priority: 3.   Arthritis of facet joint of cervical spine 07/28/2017    Priority: 3.   RBBB 07/11/2015    Priority: 3.   Senile nuclear sclerosis 12/25/2011    Priority: 3.   Myopia 12/10/2011    Priority: 3.   Lesion of right lung 08/06/2017   Past Surgical History:  Procedure Laterality Date   BLADDER SURGERY  1985   Long term incontinence. [facility]- used small intestine to build bladder   BREAST BIOPSY  2001   CATARACT EXTRACTION W/ INTRAOCULAR LENS  IMPLANT, BILATERAL     Intestinal blockage  1989 and 1990   bowel obstruction surgery   Lung cancer removal   08/2017   Right lower lobe   VIDEO ASSISTED THORACOSCOPY (VATS)/WEDGE RESECTION Right 09/16/2017   Procedure: VIDEO ASSISTED THORACOSCOPY (VATS)/LUNG RESECTION;  Surgeon: [name], MD;  Location: [facility];  Service: Thoracic;  Laterality: Right;   VIDEO BRONCHOSCOPY N/A 09/16/2017   Procedure:  VIDEO BRONCHOSCOPY;  Surgeon: [name], MD;  Location: [facility];  Service: Thoracic;  Laterality: N/A;   WRIST SURGERY     de quervains tenosynovitis. Dr. Maureen Ralphs actually did this.     Family History  Problem Relation Age of Onset   Dementia Mother    Heart attack Mother        age 19. after hip fracture.    Hypertension Mother    Prostate cancer Father    Hypertension Father    Breast cancer Sister    Hypertension Sister    Heart attack Sister        41. former smoker   Heart attack Paternal Grandfather    Hypertension Sister    Colon cancer Other        family History   Colon polyps Other        family history    Medications- reviewed and updated Current Outpatient Medications  Medication Sig Dispense Refill   acetaminophen (TYLENOL)  325 MG tablet Take 650 mg by mouth every 6 (six) hours as needed for mild pain.     fluticasone (FLONASE) 50 MCG/ACT nasal spray Place 1 spray into both nostrils daily.     levothyroxine (SYNTHROID) 75 MCG tablet TAKE 1 TABLET BY MOUTH EVERY DAY 90 tablet 0   metoprolol tartrate (LOPRESSOR) 25 MG tablet TAKE 1 TABLET BY MOUTH TWICE A DAY 180 tablet 2   Multiple Vitamins-Minerals (CENTRUM ADULTS PO) Take 1 tablet by mouth daily.     Propylene Glycol-Glycerin (SOOTHE OP) Place 1 drop into both eyes 2 (two) times daily as needed (dry eyes).     rosuvastatin (CRESTOR) 5 MG tablet TAKE 1 TABLET (5 MG TOTAL) BY MOUTH DAILY. (Patient taking differently: Take 5 mg by mouth 2 (two) times a week.) 90 tablet 1   No current facility-administered medications for this visit.    Allergies-reviewed and updated Allergies  Allergen Reactions   Codeine Nausea And Vomiting   Quinolones     Patient was warned about not using Cipro and similar antibiotics. Recent studies have raised concern that fluoroquinolone antibiotics could be associated with an increased risk of aortic aneurysm Fluoroquinolones have non-antimicrobial properties that might jeopardise the integrity of the extracellular matrix of the vascular wall In a  propensity score matched cohort study in [place], there was a 66% increased rate of aortic aneurysm or dissection associated with oral fluoroquinolone use, compared wit    Social History   Social History Narrative   Single. Lives alone.    Lived in [place] for some years to help with parents but otherwise in [place] most of life.       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 Oropharynx normal Neck: no thyromegaly CV: RRR no murmurs rubs or gallops Lungs: CTAB no crackles, wheeze, rhonchi Abdomen: soft/nontender/nondistended/normal bowel sounds. No rebound or guarding.  Ext: no edema Skin: warm, dry Neuro: grossly normal, moves all extremities, PERRLA Breasts: normal appearance, no masses or tenderness  Assessment and Plan   70 y.o. female presenting for annual physical.  Health Maintenance counseling: 1. Anticipatory guidance: Patient counseled regarding regular dental exams -q6 months, eye exams - yearly,  avoiding smoking and second hand smoke, limiting alcohol to 1 beverage per day - alcohol free (history of alcoholism).  No illicit drugs 2. Risk factor reduction:  Advised patient of need for regular exercise and diet rich and fruits and vegetables to reduce risk of heart attack and stroke. Exercise- Does water aerobics plus  3 days a week for 5 miles walking. Diet- Remains beef and pork free, reasonably healthy diet.  Wt Readings from Last 3 Encounters:  04/02/21 142 lb 12.8 oz (64.8 kg)  12/19/20 141 lb (64 kg)  12/14/20 141 lb 1.6 oz (64 kg)  3. Immunizations/screenings/ancillary studies DISCUSSED:  Flu vaccination (last on 10/21) - high dose given today -COVID vaccination #4 repeat planned - just had bivalent booster- sleepless night right after Immunization History  Administered Date(s) Administered   Fluad Quad(high Dose 65+) 03/26/2019   Influenza Split 05/09/2011, 04/26/2012, 03/24/2014, 04/22/2014   Influenza Whole 03/19/2017   Influenza, High Dose Seasonal PF 04/24/2016, 03/20/2018   Influenza, Seasonal, Injecte, Preservative Fre 04/11/2015   Influenza-Unspecified 03/19/2017, 03/31/2017, 03/24/2020   Moderna SARS-COV2 Booster Vaccination 03/23/2021   Moderna Sars-Covid-2 Vaccination 07/26/2019, 08/24/2019, 11/03/2020   PPD Test 09/13/2014   Pneumococcal  Conjugate-13 06/15/2015   Pneumococcal Polysaccharide-23 03/19/2017   Tdap 09/15/2014   Zoster Recombinat (Shingrix) 03/13/2018, 05/12/2019, 10/26/2019   Zoster, Live 06/11/2013    4. Cervical cancer screening- no history of abnormal Pap smear.  Past age based screening recommendations 5. Breast cancer screening- breast exam normal  and mammogram 08/21/20  6. Colon cancer screening - Colonoscopy within our system May 06, 2019 with plan for 3-year repeat due to history of polyps 7. Skin cancer screening- see skin surgery center yearly due to history of skin cancer. advised regular sunscreen use. Denies worrisome, changing, or new skin lesions.  8. Birth control/STD check-  postmenopausal.  Single/not sexually active 9. Osteoporosis screening at 24- osteopenia 2017 with worst T score -2.0 and lumbar spine.  repeat 04/10/20 with hip score 4.2% - wants to hold off on fosamax but doing calciu/vitm D/weight bearing exercise- consider repeat next year and if worsening reconsider plan -Former smoker - quit smoking in 2010-history of lung cancer.  Get urinalysis today  Status of chronic or acute concerns   #social update- sister in ICU after a fib and later aortic aneurysm repair  #intercostal pain- injection failed looking at epidural   #History of lung cancer-right lower lobe lobectomy March 2019 continues to follow with Dr. Servando Snare.   Repeat scan June 2022-with 1 year follow-up planned. Spaced out to yearly visits! Did have possible bone lesion November 2020 but follow up pet scan reassuring.    #Ascending aortic aneurysm max 4.2 cm-plans for annual CT scan.  Follow with Dr. Acie Fredrickson as well-on recent CT of the chest was listed as 4 x 4 centimeter    #Alcoholism in remission (HCC)-congratulated patient on remaining alcohol free     #hypertension- controlled/palpitations- reported none recently S: medication: Metoprolol 25Mg  twice daily.  No palpitations on metoprolol recently-  BP Readings  from Last 3 Encounters:  04/02/21 111/73  12/19/20 129/78  12/14/20 123/70  A/P: Blood pressure ideally controlled with known aortic aneurysm  #hypothyroidism-followed with Dr. Loanne Drilling.   S: compliant On thyroid medication-Synthroid 5mcg daily Lab Results  Component Value Date   TSH 5.22  03/26/2021   A/P:sees Dr. Loanne Drilling on Thursday- no adjustments likely -likely will have updated scan of thyrioid   #hyperlipidemia/coronary artery calcium/aortic atherosclerosis S: Medication: Rosuvastatin 5 mg twice weekly (cardiology tried to put her on daily and she later stepped back- was on 3x a week and LDL was 67 about 6 months ago)  Lab Results  Component Value Date   CHOL 152 03/26/2021   HDL 65.10 03/26/2021   LDLCALC 73 03/26/2021   TRIG 73.0 03/26/2021   CHOLHDL 2 03/26/2021   A/P: LDL slightly above goal with aortic atherosclerosis- target LDL under 70 adjust rosuvatatin to 3x a week from 2x a week (LDL was under 70 on this previously)   #Pulmonary emphysema, unspecified emphysema type (HCC)-patient largely asymptomatic.  Simply noted on imaging. Stable without meds- monitor - stable - monitor  #History of atrial fibrillation-no recurrence after her surgery  but did have 2 episodes seated with HR up to 130 with anxiety at least 1x. Discussed could use a fib detector on her watch and report to cardiology if notes this   #Chronic nonintractable headache-ongoing issues with neck tension can cause headaches.  Steroid injections in the past were not very helpful    # post nasal drip - relief with flonase    Recommended follow up: No follow-ups on file. Future Appointments  Date Time Provider [place]  04/05/2021  4:15 PM Renato Shin, MD [facility] None  11/22/2021  1:00 PM [facility] HEALTH COACH [facility]  12/10/2021 11:45 AM [facility] None  12/12/2021  1:45 PM Curt Bears, MD [facility] None   Lab/Order associations: Not fasting   ICD-10-CM   1.  Preventative health care  Z00.00     2. Alcoholism in remission ([place])  F10.21     3. Pulmonary emphysema, unspecified emphysema type ([place])  J43.9     4. Acquired hypothyroidism  E03.9     5. Primary hypertension  I10     6. Chronic nonintractable headache, unspecified headache type  R51.9    G89.29     7. Ascending aortic aneurysm, unspecified whether ruptured  I71.21       No orders of the defined types were placed in this encounter.   I,Jada Bradford,acting as a scribe for Garret Reddish, MD.,have documented all relevant documentation on the behalf of Garret Reddish, MD,as directed by  Garret Reddish, MD while in the presence of Garret Reddish, MD.   I, Garret Reddish, MD, have reviewed all documentation for this visit. The documentation on 04/02/21 for the exam, diagnosis, procedures, and orders are all accurate and complete.   Return precautions advised.  Garret Reddish, MD

## 2021-03-29 ENCOUNTER — Ambulatory Visit: Payer: Medicare PPO | Admitting: Endocrinology

## 2021-04-02 ENCOUNTER — Ambulatory Visit (INDEPENDENT_AMBULATORY_CARE_PROVIDER_SITE_OTHER): Payer: Medicare PPO | Admitting: Family Medicine

## 2021-04-02 ENCOUNTER — Encounter: Payer: Self-pay | Admitting: Family Medicine

## 2021-04-02 ENCOUNTER — Other Ambulatory Visit: Payer: Self-pay

## 2021-04-02 VITALS — BP 111/73 | HR 63 | Temp 98.2°F | Ht 67.0 in | Wt 142.8 lb

## 2021-04-02 DIAGNOSIS — F1021 Alcohol dependence, in remission: Secondary | ICD-10-CM | POA: Diagnosis not present

## 2021-04-02 DIAGNOSIS — Z0000 Encounter for general adult medical examination without abnormal findings: Secondary | ICD-10-CM | POA: Diagnosis not present

## 2021-04-02 DIAGNOSIS — R519 Headache, unspecified: Secondary | ICD-10-CM

## 2021-04-02 DIAGNOSIS — Z87891 Personal history of nicotine dependence: Secondary | ICD-10-CM | POA: Diagnosis not present

## 2021-04-02 DIAGNOSIS — Z23 Encounter for immunization: Secondary | ICD-10-CM

## 2021-04-02 DIAGNOSIS — I7121 Aneurysm of the ascending aorta, without rupture: Secondary | ICD-10-CM

## 2021-04-02 DIAGNOSIS — Z8679 Personal history of other diseases of the circulatory system: Secondary | ICD-10-CM

## 2021-04-02 DIAGNOSIS — I700 Atherosclerosis of aorta: Secondary | ICD-10-CM

## 2021-04-02 DIAGNOSIS — G8929 Other chronic pain: Secondary | ICD-10-CM

## 2021-04-02 DIAGNOSIS — I10 Essential (primary) hypertension: Secondary | ICD-10-CM

## 2021-04-02 DIAGNOSIS — Z85118 Personal history of other malignant neoplasm of bronchus and lung: Secondary | ICD-10-CM

## 2021-04-02 DIAGNOSIS — J439 Emphysema, unspecified: Secondary | ICD-10-CM | POA: Diagnosis not present

## 2021-04-02 DIAGNOSIS — E039 Hypothyroidism, unspecified: Secondary | ICD-10-CM | POA: Diagnosis not present

## 2021-04-02 LAB — POC URINALSYSI DIPSTICK (AUTOMATED)
Bilirubin, UA: NEGATIVE
Blood, UA: NEGATIVE
Glucose, UA: NEGATIVE
Ketones, UA: NEGATIVE
Leukocytes, UA: NEGATIVE
Nitrite, UA: NEGATIVE
Protein, UA: NEGATIVE
Spec Grav, UA: 1.01 (ref 1.010–1.025)
Urobilinogen, UA: 0.2 E.U./dL
pH, UA: 6.5 (ref 5.0–8.0)

## 2021-04-02 MED ORDER — ROSUVASTATIN CALCIUM 5 MG PO TABS
5.0000 mg | ORAL_TABLET | ORAL | 3 refills | Status: DC
Start: 2021-04-02 — End: 2022-04-26

## 2021-04-02 NOTE — Patient Instructions (Addendum)
Please stop by lab before you go- urine If you have mychart- we will send your results within 3 business days of [place] receiving them.  If you do not have mychart- we will call you about results within 5 business days of [place] receiving them.  *please also note that you will see labs on mychart as soon as they post. I will later go in and write notes on them- will say "notes from Dr. Yong Channel"  Recommended follow up: Return in about 1 year (around 04/02/2022) for physical or sooner if needed.

## 2021-04-02 NOTE — Addendum Note (Signed)
Addended by: Loura Back on: 04/02/2021 09:10 AM   Modules accepted: Orders

## 2021-04-05 ENCOUNTER — Ambulatory Visit: Payer: Medicare PPO | Admitting: Endocrinology

## 2021-04-05 ENCOUNTER — Other Ambulatory Visit: Payer: Self-pay

## 2021-04-05 VITALS — BP 140/78 | HR 77 | Ht 67.0 in | Wt 143.6 lb

## 2021-04-05 DIAGNOSIS — E039 Hypothyroidism, unspecified: Secondary | ICD-10-CM | POA: Diagnosis not present

## 2021-04-05 NOTE — Progress Notes (Signed)
Subjective:    Patient ID: Margaret Charles, female    DOB: 08/18/1950, 70 y.o.   MRN: [id number]  HPI Pt returns for f/u of MNG (dx'ed early 2019, on a PET CT; no nodule met criteria for bx or f/u, based on [place] criteria; she also has h/o stage 1 SCCA of the lung; she takes synthroid for hypothyroidism; f/u US in 2021 showed no f/u US needed).  she does not notice the goiter. She takes synthroid as rx'ed.  Past Medical History:  Diagnosis Date   Arthritis    ddd   Chronic headaches    imprved recently per patient    Emphysema of lung (HCC)    asymptomatic. noted on CT   Family history of colon cancer    History of adenomatous polyp of colon    2015 precancerous polyp history- due spring 2020.  5 years   History of seizures    1980s few ??? seizure activity  (vagal response)   Hypertension    Lung cancer (HCC) dx'd 07/2017   Palpitations    RBBB (right bundle branch block)    palpitations   Skin cancer    skin basal cell cancer    Past Surgical History:  Procedure Laterality Date   BLADDER SURGERY  1985   Long term incontinence. [facility]- used small intestine to build bladder   BREAST BIOPSY  2001   CATARACT EXTRACTION W/ INTRAOCULAR LENS  IMPLANT, BILATERAL     Intestinal blockage  1989 and 1990   bowel obstruction surgery   Lung cancer removal   08/2017   Right lower lobe   VIDEO ASSISTED THORACOSCOPY (VATS)/WEDGE RESECTION Right 09/16/2017   Procedure: VIDEO ASSISTED THORACOSCOPY (VATS)/LUNG RESECTION;  Surgeon: Delight Ovens, MD;  Location: [facility] OR;  Service: Thoracic;  Laterality: Right;   VIDEO BRONCHOSCOPY N/A 09/16/2017   Procedure: VIDEO BRONCHOSCOPY;  Surgeon: Delight Ovens, MD;  Location: [facility] OR;  Service: Thoracic;  Laterality: N/A;   WRIST SURGERY     de quervains tenosynovitis. Dr. Despina Hick actually did this.     Social History   Socioeconomic History   Marital status: Single    Spouse name: Not on file   Number of children: Not on file   Years of  education: Not on file   Highest education level: Not on file  Occupational History   Occupation: Retired Runner, broadcasting/film/video  Tobacco Use   Smoking status: Former    Packs/day: 1.00    Years: 40.00    Pack years: 40.00    Types: Cigarettes    Quit date: 12/03/2008    Years since quitting: 12.3   Smokeless tobacco: Never  Vaping Use   Vaping Use: Never used  Substance and Sexual Activity   Alcohol use: Not Currently    Comment: hx  etoh  37 yrs   Drug use: Never   Sexual activity: Not Currently  Other Topics Concern   Not on file  Social History Narrative   Single. Lives alone.    Lived in [place] for some years to help with parents but otherwise in [place] most of life.       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   College Grad- [employer]      [employer] for [employer], a lot of time with friends- book clubs, walking, reading, some gardening   Social Determinants of Health   Financial Resource Strain: Low Risk    Difficulty of Paying Living Expenses: Not hard  at all  Food Insecurity: No Food Insecurity   Worried About Programme researcher, broadcasting/film/video in the Last Year: Never true   Ran Out of Food in the Last Year: Never true  Transportation Needs: No Transportation Needs   Lack of Transportation (Medical): No   Lack of Transportation (Non-Medical): No  Physical Activity: Sufficiently Active   Days of Exercise per Week: 5 days   Minutes of Exercise per Session: 60 min  Stress: No Stress Concern Present   Feeling of Stress : Not at all  Social Connections: Moderately Integrated   Frequency of Communication with Friends and Family: More than three times a week   Frequency of Social Gatherings with Friends and Family: More than three times a week   Attends Religious Services: More than 4 times per year   Active Member of [employer] or Organizations: Yes   Attends Banker Meetings: 1 to 4 times per year   Marital Status: Never married  Catering manager  Violence: Not At Risk   Fear of Current or Ex-Partner: No   Emotionally Abused: No   Physically Abused: No   Sexually Abused: No    Current Outpatient Medications on File Prior to Visit  Medication Sig Dispense Refill   acetaminophen (TYLENOL) 325 MG tablet Take 650 mg by mouth every 6 (six) hours as needed for mild pain.     fluticasone (FLONASE) 50 MCG/ACT nasal spray Place 1 spray into both nostrils daily.     levothyroxine (SYNTHROID) 75 MCG tablet TAKE 1 TABLET BY MOUTH EVERY DAY 90 tablet 0   metoprolol tartrate (LOPRESSOR) 25 MG tablet TAKE 1 TABLET BY MOUTH TWICE A DAY 180 tablet 2   Multiple Vitamins-Minerals (CENTRUM ADULTS PO) Take 1 tablet by mouth daily.     Propylene Glycol-Glycerin (SOOTHE OP) Place 1 drop into both eyes 2 (two) times daily as needed (dry eyes).     rosuvastatin (CRESTOR) 5 MG tablet Take 1 tablet (5 mg total) by mouth every Monday, Wednesday, and Friday. 40 tablet 3   No current facility-administered medications on file prior to visit.    Allergies  Allergen Reactions   Codeine Nausea And Vomiting   Quinolones     Patient was warned about not using Cipro and similar antibiotics. Recent studies have raised concern that fluoroquinolone antibiotics could be associated with an increased risk of aortic aneurysm Fluoroquinolones have non-antimicrobial properties that might jeopardise the integrity of the extracellular matrix of the vascular wall In a  propensity score matched cohort study in [place], there was a 66% increased rate of aortic aneurysm or dissection associated with oral fluoroquinolone use, compared wit    Family History  Problem Relation Age of Onset   Dementia Mother    Heart attack Mother        age 58. after hip fracture.    Hypertension Mother    Aortic aneurysm Mother    Prostate cancer Father    Hypertension Father    Breast cancer Sister    Hypertension Sister    Heart attack Sister        62. former smoker   Hypertension  Sister    Atrial fibrillation Sister    Aortic aneurysm Sister        s/p open heart repair   Heart attack Paternal Grandfather    Colon cancer Other        family History   Colon polyps Other        family history  BP 140/78 (BP Location: Right Arm, Patient Position: Sitting, Cuff Size: Normal)   Pulse 77   Ht $R'5\' 7"'zS$  (1.702 m)   Wt 143 lb 9.6 oz (65.1 kg)   SpO2 92%   BMI 22.49 kg/m    Review of Systems     Objective:   Physical Exam NECK: There is no palpable thyroid enlargement.  No thyroid nodule is palpable.  No palpable lymphadenopathy at the anterior neck.   Lab Results  Component Value Date   TSH 5.22 03/26/2021      Assessment & Plan:  Hypothyroidism: well-controlled.  Please continue the same synthroid  Patient Instructions  Please continue the same levothyroxine.   please come back for a follow-up appointment in 2 years.

## 2021-04-05 NOTE — Patient Instructions (Addendum)
Please continue the same levothyroxine.   please come back for a follow-up appointment in 2 years.

## 2021-04-23 DIAGNOSIS — G588 Other specified mononeuropathies: Secondary | ICD-10-CM | POA: Diagnosis not present

## 2021-04-23 DIAGNOSIS — G893 Neoplasm related pain (acute) (chronic): Secondary | ICD-10-CM | POA: Diagnosis not present

## 2021-05-23 DIAGNOSIS — Z902 Acquired absence of lung [part of]: Secondary | ICD-10-CM | POA: Diagnosis not present

## 2021-05-23 DIAGNOSIS — M5134 Other intervertebral disc degeneration, thoracic region: Secondary | ICD-10-CM | POA: Diagnosis not present

## 2021-05-23 DIAGNOSIS — G588 Other specified mononeuropathies: Secondary | ICD-10-CM | POA: Diagnosis not present

## 2021-05-25 ENCOUNTER — Other Ambulatory Visit: Payer: Self-pay | Admitting: Cardiovascular Disease

## 2021-05-29 DIAGNOSIS — L821 Other seborrheic keratosis: Secondary | ICD-10-CM | POA: Diagnosis not present

## 2021-05-29 DIAGNOSIS — L814 Other melanin hyperpigmentation: Secondary | ICD-10-CM | POA: Diagnosis not present

## 2021-05-29 DIAGNOSIS — D225 Melanocytic nevi of trunk: Secondary | ICD-10-CM | POA: Diagnosis not present

## 2021-06-06 ENCOUNTER — Other Ambulatory Visit: Payer: Self-pay | Admitting: Endocrinology

## 2021-06-13 DIAGNOSIS — G894 Chronic pain syndrome: Secondary | ICD-10-CM | POA: Diagnosis not present

## 2021-06-13 DIAGNOSIS — H26493 Other secondary cataract, bilateral: Secondary | ICD-10-CM | POA: Diagnosis not present

## 2021-06-16 DIAGNOSIS — G894 Chronic pain syndrome: Secondary | ICD-10-CM | POA: Diagnosis not present

## 2021-06-17 DIAGNOSIS — G894 Chronic pain syndrome: Secondary | ICD-10-CM | POA: Diagnosis not present

## 2021-07-09 NOTE — Progress Notes (Signed)
Cardiology Office Note:    Date:  07/10/2021   ID:  Margaret Charles, DOB 05-11-1951, MRN [id number]  PCP:  [name], MD  Cardiologist:  Mosiah Bastin   Referring MD: [name], MD   Problem list 1. Paroxysmal atrial fib - found after partial pneumonectomy 2.  Lung nodule 3.  Right bundle branch block   Chief Complaint  Patient presents with   Hypertension        Atrial Fibrillation       Margaret Charles is a 71 y.o. female with a hx of 1.4 cm lung nodule.  She has a history of right bundle branch block and palpitations.  We are asked to see her for preoperative clearance prior to lung surgery.  She has a RBBB - which she has known about for the past 10 years . She has seen Renaldo in [place]. ( [facility])  Echo at that time showed normal LV function  Was told that she was stable  Developed palpitations a year or so later.   30 day monitr did not show any issues  She had a low dose lung CT scan for screening ( since she was a smoker )  Was found to have a 1.4 cm mass.    deines any chest pain or dyspnea.   Walks 3 miles a day a a good pace .  Able to climb 3 flights of stairs without any problems  Does water aerobics.  BP has been elevated . She eats lots of salt - popcorn every day .  Hx of obesity in her 79s .  Has lost 90 lbs   December 08, 2017  Margaret Charles is seen back today after her Rt lower lobe lobectomy ( for stage 1 squamous cell carcinoma)  She apparently developed atrial fibrillation while in the hospital.  Her blood pressure was also not well controlled.  She was started on amiodarone 200 mg twice a day.  Which was gradually titrated down.  She is now on 200 mg a day and metoprolol   June 10, 2018: Here for follow up  Had PAF after a lobectomy   June 10, 2019: Margaret Charles is seen today for follow-up of her hypertension and paroxysmal atrial fibrillation.  She also has a history of hypothyroidism and is on Synthroid.  Has hx of lung cancer.     Has also been found to have an aneurism of her ascending aorta.  Echo showed normal LV function .   Could not differentiate whether or not the  Aortic valve was 2 or 3 leaflet. The valve functions as a 3 leaflet valve so it is likely a normal 3 leaflet valve.    Jan. 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   Has very brief episodes of palpitations Her apple watch has recorded a HR of 135 for perhaps 30 mintues  Encouraged her to record an ECG on her watch   She walks regularly ,   walked 5 miles this am    Past Medical History:  Diagnosis Date   Arthritis    ddd   Chronic headaches    imprved recently per patient    Emphysema of lung (  [place])    asymptomatic. noted on CT   Family history of colon cancer    History of adenomatous polyp of colon    2015 precancerous polyp history- due spring 2020.  5 years   History of seizures    1980s few ??? seizure activity  (vagal response)   Hypertension    Lung cancer ([place]) dx'd 07/2017   Palpitations    RBBB (right bundle branch block)    palpitations   Skin cancer    skin basal cell cancer    Past Surgical History:  Procedure Laterality Date   BLADDER SURGERY  1985   Long term incontinence. [facility]- used small intestine to build bladder   BREAST BIOPSY  2001   CATARACT EXTRACTION W/ INTRAOCULAR LENS  IMPLANT, BILATERAL     Intestinal blockage  1989 and 1990   bowel obstruction surgery   Lung cancer removal   08/2017   Right lower lobe   VIDEO ASSISTED THORACOSCOPY (VATS)/WEDGE RESECTION Right 09/16/2017   Procedure: VIDEO ASSISTED THORACOSCOPY (VATS)/LUNG RESECTION;  Surgeon: [name], MD;  Location: [place];  Service: Thoracic;  Laterality: Right;   VIDEO BRONCHOSCOPY N/A  09/16/2017   Procedure: VIDEO BRONCHOSCOPY;  Surgeon: [name], MD;  Location: [place];  Service: Thoracic;  Laterality: N/A;   WRIST SURGERY     de quervains tenosynovitis. Dr. Maureen Ralphs actually did this.     Current Medications: Current Meds  Medication Sig   acetaminophen (TYLENOL) 325 MG tablet Take 650 mg by mouth every 6 (six) hours as needed for mild pain.   fluticasone (FLONASE) 50 MCG/ACT nasal spray Place 1 spray into both nostrils daily.   levothyroxine (SYNTHROID) 75 MCG tablet TAKE 1 TABLET BY MOUTH EVERY DAY   Multiple Vitamins-Minerals (CENTRUM ADULTS PO) Take 1 tablet by mouth daily.   Propylene Glycol-Glycerin (SOOTHE OP) Place 1 drop into both eyes 2 (two) times daily as needed (dry eyes).   rosuvastatin (CRESTOR) 5 MG tablet Take 1 tablet (5 mg total) by mouth every Monday, Wednesday, and Friday.   [DISCONTINUED] metoprolol tartrate (LOPRESSOR) 25 MG tablet Take 1 tablet (25 mg total) by mouth 2 (two) times daily. Please keep upcoming appt in January 2023 with Dr. Acie Fredrickson before anymore refills. Thank you     Allergies:   Codeine and Quinolones   Social History   Socioeconomic History   Marital status: Single    Spouse name: Not on file   Number of children: Not on file   Years of education: Not on file   Highest education level: Not on file  Occupational History   Occupation: Retired Pharmacist, hospital  Tobacco Use   Smoking status: Former    Packs/day: 1.00    Years: 40.00    Pack years: 40.00    Types: Cigarettes    Quit date: 12/03/2008    Years since quitting: 12.6   Smokeless tobacco: Never  Vaping Use   Vaping Use: Never used  Substance and Sexual Activity   Alcohol use: Not Currently    Comment: hx  etoh  38 yrs   Drug use: Never   Sexual activity: Not Currently  Other Topics Concern   Not on file  Social History Narrative   Single. Lives alone.    Lived in [place] for some years to help with parents but otherwise in [place] most of life.        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   [employer]  [employer] for water aerobics, a lot of time with friends- book clubs, walking, reading, some gardening   Social Determinants of Health   Financial Resource Strain: Low Risk    Difficulty of Paying Living Expenses: Not hard at all  Food Insecurity: No Food Insecurity   Worried About Charity fundraiser in the Last Year: Never true   Arboriculturist in the Last Year: Never true  Transportation Needs: No Transportation Needs   Lack of Transportation (Medical): No   Lack of Transportation (Non-Medical): No  Physical Activity: Sufficiently Active   Days of Exercise per Week: 5 days   Minutes of Exercise per Session: 60 min  Stress: No Stress Concern Present   Feeling of Stress : Not at all  Social Connections: Moderately Integrated   Frequency of Communication with Friends and Family: More than three times a week   Frequency of Social Gatherings with Friends and Family: More than three times a week   Attends Religious Services: More than 4 times per year   Active Member of [employer] or Organizations: Yes   Attends Archivist Meetings: 1 to 4 times per year   Marital Status: Never married     Family History: The patient's family history includes Aortic aneurysm in her mother and sister; Atrial fibrillation in her sister; Breast cancer in her sister; Colon cancer in an other family member; Colon polyps in an other family member; Dementia in her mother; Heart attack in her mother, paternal grandfather, and sister; Hypertension in her father, mother, sister, and sister; Prostate cancer in her father.  ROS:   Please see the history of present illness.     All other systems reviewed and are negative.  EKGs/Labs/Other Studies Reviewed:    The following studies were reviewed today:      Recent Labs: 03/26/2021: ALT 18; BUN 12; Creatinine, Ser 0.77; Hemoglobin 12.7; Platelets 236.0;  Potassium 4.9; Sodium 141; TSH 5.22  Recent Lipid Panel    Component Value Date/Time   CHOL 152 03/26/2021 0812   CHOL 147 10/02/2020 0913   TRIG 73.0 03/26/2021 0812   HDL 65.10 03/26/2021 0812   HDL 66 10/02/2020 0913   CHOLHDL 2 03/26/2021 0812   VLDL 14.6 03/26/2021 0812   LDLCALC 73 03/26/2021 0812   LDLCALC 67 10/02/2020 0913   LDLCALC 100 (H) 03/30/2020 0920   Physical Exam: Blood pressure 132/82, Charles 67, height 5\' 7"  (1.702 m), weight 141 lb 12.8 oz (64.3 kg), SpO2 98 %.  GEN:  Well nourished, well developed in no acute distress HEENT: Normal NECK: No JVD; No carotid bruits LYMPHATICS: No lymphadenopathy CARDIAC: RRR   RESPIRATORY:  Clear to auscultation without rales, wheezing or rhonchi  ABDOMEN: Soft, non-tender, non-distended MUSCULOSKELETAL:  No edema; No deformity  SKIN: Warm and dry NEUROLOGIC:  Alert and oriented x 3   EKG:    July 10, 2021: Normal sinus rhythm at 67.  No ST or T wave changes.  Right bundle branch block. .   ASSESSMENT:  Plan      1.  Paroxysmal atrial fibrillation:  She had an episode of paroxysmal atrial fibrillation following her partial pneumonectomy.    I showed her how to record an EKG on her apple watch.  If she has more palpitations have encouraged her to make a recording and sent to [place].   2.  Essential hypertension:    BP is well controlled.    Medication Adjustments/Labs and Tests Ordered: Current medicines are  reviewed at length with the patient today.  Concerns regarding medicines are outlined above.  Orders Placed This Encounter  Procedures   EKG 12-Lead   Meds ordered this encounter  Medications   metoprolol tartrate (LOPRESSOR) 25 MG tablet    Sig: Take 1 tablet (25 mg total) by mouth 2 (two) times daily.    Dispense:  180 tablet    Refill:  3    Signed, Mertie Moores, MD  07/10/2021 4:25 PM    [facility]

## 2021-07-10 ENCOUNTER — Ambulatory Visit: Payer: Medicare PPO | Admitting: Cardiovascular Disease

## 2021-07-10 ENCOUNTER — Encounter: Payer: Self-pay | Admitting: Cardiovascular Disease

## 2021-07-10 ENCOUNTER — Other Ambulatory Visit: Payer: Self-pay

## 2021-07-10 VITALS — BP 132/82 | HR 67 | Ht 67.0 in | Wt 141.8 lb

## 2021-07-10 DIAGNOSIS — I480 Paroxysmal atrial fibrillation: Secondary | ICD-10-CM | POA: Diagnosis not present

## 2021-07-10 DIAGNOSIS — E785 Hyperlipidemia, unspecified: Secondary | ICD-10-CM

## 2021-07-10 DIAGNOSIS — I10 Essential (primary) hypertension: Secondary | ICD-10-CM | POA: Diagnosis not present

## 2021-07-10 MED ORDER — METOPROLOL TARTRATE 25 MG PO TABS: 25.0000 mg | ORAL_TABLET | Freq: Two times a day (BID) | ORAL | 3 refills | Status: DC

## 2021-07-10 NOTE — Patient Instructions (Signed)
Medication Instructions:   Your physician recommends that you continue on your current medications as directed. Please refer to the Current Medication list given to you today.  *If you need a refill on your cardiac medications before your next appointment, please call your pharmacy*    Follow-Up: At [facility], you and your health needs are our priority.  As part of our continuing mission to provide you with exceptional heart care, we have created designated Provider Care Teams.  These Care Teams include your primary Cardiologist (physician) and Advanced Practice Providers (APPs -  Physician Assistants and Nurse Practitioners) who all work together to provide you with the care you need, when you need it.  We recommend signing up for the patient portal called "MyChart".  Sign up information is provided on this After Visit Summary.  MyChart is used to connect with patients for Virtual Visits (Telemedicine).  Patients are able to view lab/test results, encounter notes, upcoming appointments, etc.  Non-urgent messages can be sent to your provider as well.   To learn more about what you can do with MyChart, go to [email or web address].    Your next appointment:   1 year(s)  The format for your next appointment:   In Person  Provider:   Mertie Moores, MD {

## 2021-07-27 DIAGNOSIS — G894 Chronic pain syndrome: Secondary | ICD-10-CM | POA: Diagnosis not present

## 2021-07-27 DIAGNOSIS — G588 Other specified mononeuropathies: Secondary | ICD-10-CM | POA: Diagnosis not present

## 2021-08-22 DIAGNOSIS — Z1231 Encounter for screening mammogram for malignant neoplasm of breast: Secondary | ICD-10-CM | POA: Diagnosis not present

## 2021-08-22 LAB — HM MAMMOGRAPHY

## 2021-09-04 ENCOUNTER — Other Ambulatory Visit: Payer: Self-pay | Admitting: Endocrinology

## 2021-11-14 DIAGNOSIS — G588 Other specified mononeuropathies: Secondary | ICD-10-CM | POA: Diagnosis not present

## 2021-11-14 DIAGNOSIS — Z902 Acquired absence of lung [part of]: Secondary | ICD-10-CM | POA: Diagnosis not present

## 2021-11-14 DIAGNOSIS — G894 Chronic pain syndrome: Secondary | ICD-10-CM | POA: Diagnosis not present

## 2021-11-22 ENCOUNTER — Ambulatory Visit: Payer: Medicare PPO

## 2021-12-03 ENCOUNTER — Telehealth: Payer: Self-pay | Admitting: Physician Assistant

## 2021-12-03 ENCOUNTER — Ambulatory Visit: Payer: Medicare PPO

## 2021-12-03 NOTE — Telephone Encounter (Signed)
Rescheduled 06/21 appointment due to provider on-call schedule, patient has been called and notified of new appointment time.

## 2021-12-07 ENCOUNTER — Other Ambulatory Visit: Payer: Self-pay

## 2021-12-07 DIAGNOSIS — E039 Hypothyroidism, unspecified: Secondary | ICD-10-CM

## 2021-12-07 MED ORDER — LEVOTHYROXINE SODIUM 75 MCG PO TABS: 75.0000 ug | ORAL_TABLET | Freq: Every day | ORAL | 0 refills | Status: DC

## 2021-12-07 NOTE — Progress Notes (Unsigned)
[facility] OFFICE PROGRESS NOTE  [name], MD [place] [address]  DIAGNOSIS: Stage IA (T1b, N0, M0) non-small cell lung cancer, squamous cell carcinoma diagnosed in March 2019.  PRIOR THERAPY: Status post right lower lobectomy with lymph node dissection under the care of Dr. Servando Snare on September 16, 2017.  CURRENT THERAPY: Observation.  INTERVAL HISTORY: Margaret Charles 71 y.o. female returns to the clinic today for a follow-up visit.  The patient is here today for an annual follow-up visit.  The patient was diagnosed with stage I non-small cell lung cancer in 2019 and is status post resection.  She has been on observation since that time.  Overall she is feeling well. 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  She has seen a neurosurgeon for this and has tried multiple medications as well as injections. They also recently inserted a peripheral nerve stimulator without much success.  The patient is going to try acupuncture next.  Otherwise the patient feels well and is still physically active.  She states her breathing was never impacted from having surgery.  She has good energy and still works part-time as a Oceanographer. She denies any fever, chills, night sweats, or unexplained weight loss. She denies any shortness of breath, cough, or hemoptysis.  Denies any nausea, vomiting, diarrhea, or constipation.  Denies any headache or visual changes.  She recently had a restaging CT scan performed.  She is here today for evaluation to review her scan results.   MEDICAL HISTORY: Past Medical History:  Diagnosis Date   Arthritis    ddd   Chronic headaches    imprved recently per patient    Emphysema of lung ([place])    asymptomatic. noted on CT   Family history of colon cancer    History of adenomatous polyp of colon    2015 precancerous polyp history- due spring 2020.  5 years   History of seizures    1980s few ??? seizure  activity  (vagal response)   Hypertension    Lung cancer ([place]) dx'd 07/2017   Palpitations    RBBB (right bundle branch block)    palpitations   Skin cancer    skin basal cell cancer    ALLERGIES:  is allergic to codeine and quinolones.  MEDICATIONS:  Current Outpatient Medications  Medication Sig Dispense Refill   acetaminophen (TYLENOL) 325 MG tablet Take 650 mg by mouth every 6 (six) hours as needed for mild pain.     fluticasone (FLONASE) 50 MCG/ACT nasal spray Place 1 spray into both nostrils daily.     levothyroxine (SYNTHROID) 75 MCG tablet Take 1 tablet (75 mcg total) by mouth daily. 90 tablet 0   metoprolol tartrate (LOPRESSOR) 25 MG tablet Take 1 tablet (25 mg total) by mouth 2 (two) times daily. 180 tablet 3   Multiple Vitamins-Minerals (CENTRUM ADULTS PO) Take 1 tablet by mouth daily.     Propylene Glycol-Glycerin (SOOTHE OP) Place 1 drop into both eyes 2 (two) times daily as needed (dry eyes).     rosuvastatin (CRESTOR) 5 MG tablet Take 1 tablet (5 mg total) by mouth every Monday, Wednesday, and Friday. 40 tablet 3   No current facility-administered medications for this visit.    SURGICAL HISTORY:  Past Surgical History:  Procedure Laterality Date   BLADDER SURGERY  1985   Long term incontinence. [facility]- used small intestine to build bladder   BREAST BIOPSY  2001   CATARACT  EXTRACTION W/ INTRAOCULAR LENS  IMPLANT, BILATERAL     Intestinal blockage  1989 and 1990   bowel obstruction surgery   Lung cancer removal   08/2017   Right lower lobe   VIDEO ASSISTED THORACOSCOPY (VATS)/WEDGE RESECTION Right 09/16/2017   Procedure: VIDEO ASSISTED THORACOSCOPY (VATS)/LUNG RESECTION;  Surgeon: [name], MD;  Location: [facility];  Service: Thoracic;  Laterality: Right;   VIDEO BRONCHOSCOPY N/A 09/16/2017   Procedure: VIDEO BRONCHOSCOPY;  Surgeon: [name], MD;  Location: [facility];  Service: Thoracic;  Laterality: N/A;   WRIST SURGERY     de quervains tenosynovitis.  Dr. Maureen Ralphs actually did this.     REVIEW OF SYSTEMS:   Review of Systems  Constitutional: Negative for appetite change, chills, fatigue, fever and unexpected weight change.  HENT: Negative for mouth sores, nosebleeds, sore throat and trouble swallowing.   Eyes: Negative for eye problems and icterus.  Respiratory: Negative for cough, hemoptysis, shortness of breath and wheezing.   Cardiovascular: Positive for right-sided chest discomfort from surgery.  Negative for leg swelling.  Gastrointestinal: Negative for abdominal pain, constipation, diarrhea, nausea and vomiting.  Genitourinary: Negative for bladder incontinence, difficulty urinating, dysuria, frequency and hematuria.   Musculoskeletal: Negative for back pain, gait problem, neck pain and neck stiffness.  Skin: Negative for itching and rash.  Neurological: Negative for dizziness, extremity weakness, gait problem, headaches, light-headedness and seizures.  Hematological: Negative for adenopathy. Does not bruise/bleed easily.  Psychiatric/Behavioral: Negative for confusion, depression and sleep disturbance. The patient is not nervous/anxious.     PHYSICAL EXAMINATION:  Blood pressure 130/75, pulse 72, temperature 98.1 F (36.7 C), temperature source Tympanic, resp. rate 15, weight 142 lb (64.4 kg), SpO2 98 %.  ECOG PERFORMANCE STATUS: 1  Physical Exam  Constitutional: Oriented to person, place, and time and well-developed, well-nourished, and in no distress.  HENT:  Head: Normocephalic and atraumatic.  Mouth/Throat: Oropharynx is clear and moist. No oropharyngeal exudate.  Eyes: Conjunctivae are normal. Right eye exhibits no discharge. Left eye exhibits no discharge. No scleral icterus.  Neck: Normal range of motion. Neck supple.  Cardiovascular: Normal rate, regular rhythm, normal heart sounds and intact distal pulses.   Pulmonary/Chest: Effort normal and breath sounds normal. No respiratory distress. No wheezes. No rales.   Abdominal: Soft. Bowel sounds are normal. Exhibits no distension and no mass. There is no tenderness.  Musculoskeletal: Normal range of motion. Exhibits no edema.  Lymphadenopathy:    No cervical adenopathy.  Neurological: Alert and oriented to person, place, and time. Exhibits normal muscle tone. Gait normal. Coordination normal.  Skin: Skin is warm and dry. No rash noted. Not diaphoretic. No erythema. No pallor.  Psychiatric: Mood, memory and judgment normal.  Vitals reviewed.  LABORATORY DATA: Lab Results  Component Value Date   WBC 9.9 12/10/2021   HGB 12.4 12/10/2021   HCT 37.2 12/10/2021   MCV 89.6 12/10/2021   PLT 291 12/10/2021      Chemistry      Component Value Date/Time   NA 138 12/10/2021 1430   K 4.2 12/10/2021 1430   CL 105 12/10/2021 1430   CO2 25 12/10/2021 1430   BUN 13 12/10/2021 1430   CREATININE 0.75 12/10/2021 1430   CREATININE 0.81 03/30/2020 0920      Component Value Date/Time   CALCIUM 9.8 12/10/2021 1430   ALKPHOS 115 12/10/2021 1430   AST 27 12/10/2021 1430   ALT 26 12/10/2021 1430   BILITOT 0.5 12/10/2021 1430  RADIOGRAPHIC STUDIES:  CT Chest W Contrast  Result Date: 12/11/2021 CLINICAL DATA:  Primary Cancer Type: Lung Imaging Indication: Routine surveillance Interval therapy since last imaging? No Initial Cancer Diagnosis Date: 09/16/2017; Established by: Biopsy-proven Detailed Pathology: Stage IA non-small cell lung cancer, squamous cell carcinoma. Primary Tumor location: Right lower lobe. Surgeries: Right lower lobectomy 09/16/2017. Chemotherapy: No Immunotherapy? No Radiation therapy? No * Tracking Code: BO * EXAM: CT CHEST WITH CONTRAST TECHNIQUE: Multidetector CT imaging of the chest was performed during intravenous contrast administration. RADIATION DOSE REDUCTION: This exam was performed according to the departmental dose-optimization program which includes automated exposure control, adjustment of the mA and/or kV according to  patient size and/or use of iterative reconstruction technique. CONTRAST:  61mL OMNIPAQUE IOHEXOL 300 MG/ML  SOLN COMPARISON:  Most recent CT chest 12/06/2020.  06/03/2019 PET-CT. FINDINGS: Cardiovascular: The heart is normal in size. No pericardial effusion. Stable fusiform aneurysmal dilatation of the ascending aorta with maximum measurement of 4 cm. No dissection. Branch vessels are patent. Stable coronary artery calcifications. Mediastinum/Nodes: No mediastinal or hilar mass or adenopathy. Small sub 8 mm lymph nodes are unchanged. The esophagus is grossly normal. Lungs/Pleura: Stable surgical changes related to a right lower lobe lobectomy. No findings suspicious for recurrent tumor. No new pulmonary nodules to suggest pulmonary metastatic disease. No new pulmonary lesions. No pleural effusions or pleural nodules. Stable very small right pleural effusion. The Upper Abdomen: No significant upper abdominal findings. No hepatic or adrenal gland lesions. Musculoskeletal: No breast masses, supraclavicular or axillary adenopathy. The bony thorax is intact. IMPRESSION: 1. Stable surgical changes related to a right lower lobe lobectomy. No findings suspicious for recurrent tumor, mediastinal/hilar adenopathy or pulmonary metastatic disease. 2. Stable 4 cm fusiform aneurysmal dilatation of the ascending aorta. 3. Stable coronary artery calcifications. 4. Stable very small right pleural effusion. Aortic Atherosclerosis (ICD10-I70.0). Electronically Signed   By: Marijo Sanes M.D.   On: 12/11/2021 12:34     ASSESSMENT/PLAN:  This is a very pleasant 71 year old Caucasian female diagnosed with stage I non-small cell lung cancer squamous cell carcinoma of the right lower lobe which was diagnosed in 2019.  The patient is status post right lower lobe lobectomy lymph node dissection on September 16, 2017 under the care of Dr. Servando Snare  The patient has been on observation since that time and feeling well.  The patient had a  restaging CT scan performed.  Dr. Julien Nordmann personally independently reviewed the scan discussed the results with the patient today.  The scan shows no evidence of disease progression.  We will see her back for follow-up visit in 1 year for evaluation and for repeat imaging studies.  She will continue to follow with neuro surgery regarding her intercostal neuralgia.  The patient was advised to call immediately if she has any concerning symptoms in the interval. The patient voices understanding of current disease status and treatment options and is in agreement with the current care plan. All questions were answered. The patient knows to call the clinic with any problems, questions or concerns. We can certainly see the patient much sooner if necessary  Orders Placed This Encounter  Procedures   CT Chest W Contrast    Standing Status:   Future    Standing Expiration Date:   12/11/2022    Order Specific Question:   If indicated for the ordered procedure, I authorize the administration of contrast media per Radiology protocol    Answer:   Yes    Order Specific Question:  Preferred imaging location?    Answer:   [facility]   CBC with Differential ([facility] Only)    Standing Status:   Future    Standing Expiration Date:   12/12/2022   CMP ([facility] only)    Standing Status:   Future    Standing Expiration Date:   12/12/2022     Margaret Sos Pelagia Iacobucci, PA-C 12/11/21  ADDENDUM: Hematology/Oncology Attending: I had a face-to-face encounter with the patient today.  I reviewed her records, lab, scan and recommended her care plan.  This is a very pleasant 71 years old white female with a stage Ia non-small cell lung cancer, squamous cell carcinoma involving the right lower lobe diagnosed in 2019 status post right lower lobectomy with lymph node dissection under the care of Dr. Servando Snare. The patient has been on observation for the last 4 years and she has been doing fine with  no concerning complaints. She had repeat CT scan of the chest performed recently.  I personally and independently reviewed the scan and discussed the result with the patient today. Her scan showed no concerning findings for disease recurrence or metastasis. I recommended for her to continue on observation with repeat CT scan of the chest in 1 year. The patient was advised to call immediately if she has any other concerning symptoms in the interval.  Disclaimer: This note was dictated with voice recognition software. Similar sounding words can inadvertently be transcribed and may be missed upon review. Eilleen Kempf, MD

## 2021-12-10 ENCOUNTER — Other Ambulatory Visit: Payer: Self-pay

## 2021-12-10 ENCOUNTER — Inpatient Hospital Stay: Payer: Medicare PPO | Attending: Internal Medicine

## 2021-12-10 ENCOUNTER — Ambulatory Visit (HOSPITAL_COMMUNITY)
Admission: RE | Admit: 2021-12-10 | Discharge: 2021-12-10 | Disposition: A | Discharge status: Home / Self Care | Payer: Medicare PPO | Source: Ambulatory Visit | Attending: Internal Medicine | Admitting: Internal Medicine

## 2021-12-10 DIAGNOSIS — Z85118 Personal history of other malignant neoplasm of bronchus and lung: Secondary | ICD-10-CM | POA: Insufficient documentation

## 2021-12-10 DIAGNOSIS — C3490 Malignant neoplasm of unspecified part of unspecified bronchus or lung: Secondary | ICD-10-CM | POA: Diagnosis not present

## 2021-12-10 DIAGNOSIS — Z902 Acquired absence of lung [part of]: Secondary | ICD-10-CM | POA: Insufficient documentation

## 2021-12-10 DIAGNOSIS — J90 Pleural effusion, not elsewhere classified: Secondary | ICD-10-CM | POA: Diagnosis not present

## 2021-12-10 DIAGNOSIS — R918 Other nonspecific abnormal finding of lung field: Secondary | ICD-10-CM | POA: Diagnosis not present

## 2021-12-10 DIAGNOSIS — G588 Other specified mononeuropathies: Secondary | ICD-10-CM | POA: Insufficient documentation

## 2021-12-10 LAB — CBC WITH DIFFERENTIAL (CANCER CENTER ONLY)
Abs Immature Granulocytes: 0.02 10*3/uL (ref 0.00–0.07)
Basophils Absolute: 0 10*3/uL (ref 0.0–0.1)
Basophils Relative: 0 %
Eosinophils Absolute: 0.2 10*3/uL (ref 0.0–0.5)
Eosinophils Relative: 2 %
HCT: 37.2 % (ref 36.0–46.0)
Hemoglobin: 12.4 g/dL (ref 12.0–15.0)
Immature Granulocytes: 0 %
Lymphocytes Relative: 28 %
Lymphs Abs: 2.8 10*3/uL (ref 0.7–4.0)
MCH: 29.9 pg (ref 26.0–34.0)
MCHC: 33.3 g/dL (ref 30.0–36.0)
MCV: 89.6 fL (ref 80.0–100.0)
Monocytes Absolute: 0.7 10*3/uL (ref 0.1–1.0)
Monocytes Relative: 7 %
Neutro Abs: 6.2 10*3/uL (ref 1.7–7.7)
Neutrophils Relative %: 63 %
Platelet Count: 291 10*3/uL (ref 150–400)
RBC: 4.15 MIL/uL (ref 3.87–5.11)
RDW: 13.2 % (ref 11.5–15.5)
WBC Count: 9.9 10*3/uL (ref 4.0–10.5)
nRBC: 0 % (ref 0.0–0.2)

## 2021-12-10 LAB — CMP (CANCER CENTER ONLY)
ALT: 26 U/L (ref 0–44)
AST: 27 U/L (ref 15–41)
Albumin: 4.1 g/dL (ref 3.5–5.0)
Alkaline Phosphatase: 115 U/L (ref 38–126)
Anion gap: 8 (ref 5–15)
BUN: 13 mg/dL (ref 8–23)
CO2: 25 mmol/L (ref 22–32)
Calcium: 9.8 mg/dL (ref 8.9–10.3)
Chloride: 105 mmol/L (ref 98–111)
Creatinine: 0.75 mg/dL (ref 0.44–1.00)
GFR, Estimated: >60 mL/min (ref >60)
Glucose, Bld: 90 mg/dL (ref 70–99)
Potassium: 4.2 mmol/L (ref 3.5–5.1)
Sodium: 138 mmol/L (ref 135–145)
Total Bilirubin: 0.5 mg/dL (ref 0.3–1.2)
Total Protein: 7.2 g/dL (ref 6.5–8.1)

## 2021-12-10 MED ORDER — IOHEXOL 300 MG/ML  SOLN
80.0000 mL | Freq: Once | INTRAMUSCULAR | Status: AC | PRN
  Administered 2021-12-10: 75 mL via INTRAVENOUS

## 2021-12-10 MED ORDER — SODIUM CHLORIDE (PF) 0.9 % IJ SOLN
INTRAMUSCULAR | Status: AC
  Filled 2021-12-10: qty 50

## 2021-12-11 ENCOUNTER — Encounter: Payer: Self-pay | Admitting: Thoracic Surgery (Cardiothoracic Vascular Surgery)

## 2021-12-11 ENCOUNTER — Inpatient Hospital Stay: Payer: Medicare PPO | Admitting: Physician Assistant

## 2021-12-11 ENCOUNTER — Ambulatory Visit: Payer: Medicare PPO | Admitting: Thoracic Surgery (Cardiothoracic Vascular Surgery)

## 2021-12-11 ENCOUNTER — Encounter: Payer: Self-pay | Admitting: Physician Assistant

## 2021-12-11 VITALS — BP 125/79 | HR 69 | Resp 20 | Ht 67.0 in | Wt 142.3 lb

## 2021-12-11 VITALS — BP 130/75 | HR 72 | Temp 98.1°F | Resp 15 | Wt 142.0 lb

## 2021-12-11 DIAGNOSIS — G588 Other specified mononeuropathies: Secondary | ICD-10-CM | POA: Diagnosis not present

## 2021-12-11 DIAGNOSIS — Z85118 Personal history of other malignant neoplasm of bronchus and lung: Secondary | ICD-10-CM | POA: Diagnosis not present

## 2021-12-11 DIAGNOSIS — R911 Solitary pulmonary nodule: Secondary | ICD-10-CM

## 2021-12-11 DIAGNOSIS — Z902 Acquired absence of lung [part of]: Secondary | ICD-10-CM | POA: Diagnosis not present

## 2021-12-11 NOTE — Progress Notes (Signed)
[address]       [place] [address]             [phone]       HPI: [name] returns for a scheduled follow-up visit regarding her ascending aneurysm.  Margaret Charles is a 71 year old woman with a history of tobacco abuse (quit 2010, lung cancer, intercostal neuralgia, arthritis, hypertension, right bundle branch block, skin cancer, and an ascending aneurysm.  Dr. Servando Snare did a right lower lobectomy for stage Ia non-small cell carcinoma in 2019.  On a follow-up scan she was noted to have a 4.1 cm ascending aneurysm.  I took over follow-up after Dr. Servando Snare retired.  I last saw her in June 2022.  The aneurysm was stable at 4.1 cm.  There was no evidence of recurrent lung cancer.  She was still having problems with intercostal neuralgia.  In the interim since her last visit she has been feeling well for the most part.  She still has significant issues with intercostal neuralgia.  She is been seeing Dr. Davy Pique at [facility] neuro[facility].  She is considering a trial of acupuncture.  No chest pain, pressure, or tightness.  She is very anxious about the diagnosis of an aneurysm.  Past Medical History:  Diagnosis Date   Arthritis    ddd   Chronic headaches    imprved recently per patient    Emphysema of lung ([place])    asymptomatic. noted on CT   Family history of colon cancer    History of adenomatous polyp of colon    2015 precancerous polyp history- due spring 2020.  5 years   History of seizures    1980s few ??? seizure activity  (vagal response)   Hypertension    Lung cancer ([place]) dx'd 07/2017   Palpitations    RBBB (right bundle branch block)    palpitations   Skin cancer    skin basal cell cancer    Current Outpatient Medications  Medication Sig Dispense Refill   acetaminophen (TYLENOL) 325 MG tablet Take 650 mg by mouth every 6 (six) hours as needed for mild pain.     fluticasone (FLONASE) 50 MCG/ACT nasal spray Place 1 spray into both nostrils  daily.     levothyroxine (SYNTHROID) 75 MCG tablet Take 1 tablet (75 mcg total) by mouth daily. 90 tablet 0   metoprolol tartrate (LOPRESSOR) 25 MG tablet Take 1 tablet (25 mg total) by mouth 2 (two) times daily. 180 tablet 3   Multiple Vitamins-Minerals (CENTRUM ADULTS PO) Take 1 tablet by mouth daily.     Propylene Glycol-Glycerin (SOOTHE OP) Place 1 drop into both eyes 2 (two) times daily as needed (dry eyes).     rosuvastatin (CRESTOR) 5 MG tablet Take 1 tablet (5 mg total) by mouth every Monday, Wednesday, and Friday. 40 tablet 3   No current facility-administered medications for this visit.    Physical Exam BP 125/79 (BP Location: Left Arm, Patient Position: Sitting, Cuff Size: Normal)   Pulse 69   Resp 20   Ht 5\' 7"  (1.702 m)   Wt 142 lb 4.8 oz (64.5 kg)   SpO2 99% Comment: RA  BMI 22.8 kg/m  71 year old woman in no acute distress Well-developed and well-nourished Alert and oriented x3 with no focal deficits Lungs diminished at right base but otherwise clear No cervical or supraclavicular adenopathy Cardiac regular rate and rhythm no murmur  Diagnostic Tests: CT CHEST WITH CONTRAST   TECHNIQUE: Multidetector  CT imaging of the chest was performed during intravenous contrast administration.   RADIATION DOSE REDUCTION: This exam was performed according to the departmental dose-optimization program which includes automated exposure control, adjustment of the mA and/or kV according to patient size and/or use of iterative reconstruction technique.   CONTRAST:  85mL OMNIPAQUE IOHEXOL 300 MG/ML  SOLN   COMPARISON:  Most recent CT chest 12/06/2020.  06/03/2019 PET-CT.   FINDINGS: Cardiovascular: The heart is normal in size. No pericardial effusion. Stable fusiform aneurysmal dilatation of the ascending aorta with maximum measurement of 4 cm. No dissection. Branch vessels are patent. Stable coronary artery calcifications.   Mediastinum/Nodes: No mediastinal or hilar mass  or adenopathy. Small sub 8 mm lymph nodes are unchanged. The esophagus is grossly normal.   Lungs/Pleura: Stable surgical changes related to a right lower lobe lobectomy. No findings suspicious for recurrent tumor. No new pulmonary nodules to suggest pulmonary metastatic disease. No new pulmonary lesions. No pleural effusions or pleural nodules. Stable very small right pleural effusion. The   Upper Abdomen: No significant upper abdominal findings. No hepatic or adrenal gland lesions.   Musculoskeletal: No breast masses, supraclavicular or axillary adenopathy. The bony thorax is intact.   IMPRESSION: 1. Stable surgical changes related to a right lower lobe lobectomy. No findings suspicious for recurrent tumor, mediastinal/hilar adenopathy or pulmonary metastatic disease. 2. Stable 4 cm fusiform aneurysmal dilatation of the ascending aorta. 3. Stable coronary artery calcifications. 4. Stable very small right pleural effusion.   Aortic Atherosclerosis (ICD10-I70.0).     Electronically Signed   By: Marijo Sanes M.D.   On: 12/11/2021 12:34 I personally reviewed her CT images.  The aneurysm is unchanged at 4.1 cm.  No evidence of recurrent lung cancer.  Impression: Margaret Charles is a 71 year old woman with a history of tobacco abuse (quit 2010, lung cancer, intercostal neuralgia, arthritis, hypertension, right bundle branch block, skin cancer, and an ascending aneurysm.  Stage Ia non-small cell carcinoma of the lung-right lower lobectomy by Dr. Servando Snare 4 years ago.  No evidence of recurrent disease.  Followed by Dr. Julien Nordmann.  Ascending aneurysm-stable at 4.1 cm.  No indication for surgery.  Needs continued annual follow-up.  She had questions about the aneurysm because apparently her sister died of a ruptured aneurysm in her chest.  She actually survive surgery but then she says she never quite recovered.  I explained to her that with proper blood pressure control there is almost  no risk of any untoward events associated with her ascending aorta at 4.1 cm.  The risk of surgery would far outweigh the risk of the aneurysm itself.  Hypertension-blood pressure well controlled on Lopressor.  Plan: Return in 1 year with CT angio chest after seeing Dr. Julien Nordmann.  Melrose Nakayama, MD [facility] [phone]

## 2021-12-12 ENCOUNTER — Ambulatory Visit: Payer: Medicare PPO | Admitting: Internal Medicine

## 2021-12-12 ENCOUNTER — Ambulatory Visit: Payer: Medicare PPO | Admitting: Physician Assistant

## 2022-01-01 ENCOUNTER — Ambulatory Visit: Payer: Medicare PPO | Admitting: Thoracic Surgery (Cardiothoracic Vascular Surgery)

## 2022-01-14 DIAGNOSIS — L821 Other seborrheic keratosis: Secondary | ICD-10-CM | POA: Diagnosis not present

## 2022-01-14 DIAGNOSIS — L603 Nail dystrophy: Secondary | ICD-10-CM | POA: Diagnosis not present

## 2022-01-22 ENCOUNTER — Encounter: Payer: Self-pay | Admitting: Family Medicine

## 2022-01-23 ENCOUNTER — Other Ambulatory Visit: Payer: Self-pay

## 2022-01-23 DIAGNOSIS — E039 Hypothyroidism, unspecified: Secondary | ICD-10-CM

## 2022-01-23 MED ORDER — LEVOTHYROXINE SODIUM 75 MCG PO TABS: 75.0000 ug | ORAL_TABLET | Freq: Every day | ORAL | 0 refills | Status: DC

## 2022-01-30 ENCOUNTER — Other Ambulatory Visit (INDEPENDENT_AMBULATORY_CARE_PROVIDER_SITE_OTHER): Payer: Medicare PPO

## 2022-01-30 DIAGNOSIS — E039 Hypothyroidism, unspecified: Secondary | ICD-10-CM | POA: Diagnosis not present

## 2022-01-30 LAB — TSH: TSH: 1.52 u[IU]/mL (ref 0.35–5.50)

## 2022-01-31 ENCOUNTER — Encounter: Payer: Self-pay | Admitting: Family Medicine

## 2022-02-01 ENCOUNTER — Other Ambulatory Visit: Payer: Self-pay

## 2022-02-01 DIAGNOSIS — E039 Hypothyroidism, unspecified: Secondary | ICD-10-CM

## 2022-02-01 MED ORDER — LEVOTHYROXINE SODIUM 75 MCG PO TABS: 75.0000 ug | ORAL_TABLET | Freq: Every day | ORAL | 3 refills | Status: DC

## 2022-03-05 DIAGNOSIS — M25512 Pain in left shoulder: Secondary | ICD-10-CM | POA: Diagnosis not present

## 2022-03-18 ENCOUNTER — Ambulatory Visit (INDEPENDENT_AMBULATORY_CARE_PROVIDER_SITE_OTHER): Payer: Medicare PPO

## 2022-03-18 DIAGNOSIS — Z0000 Encounter for general adult medical examination without abnormal findings: Secondary | ICD-10-CM

## 2022-03-18 NOTE — Progress Notes (Addendum)
Virtual Visit via Telephone Note  I connected with  Margaret Charles on 03/18/22 at  1:00 PM EDT by telephone and verified that I am speaking with the correct person using two identifiers.  Medicare Annual Wellness visit completed telephonically due to Covid-19 pandemic.   Persons participating in this call: This Health Coach and this patient.   Location: Patient: home Provider: office    I discussed the limitations, risks, security and privacy concerns of performing an evaluation and management service by telephone and the availability of in person appointments. The patient expressed understanding and agreed to proceed.  Unable to perform video visit due to video visit attempted and failed and/or patient does not have video capability.   Some vital signs may be absent or patient reported.   Willette Brace, LPN  Subjective:   Margaret Charles is a 71 y.o. female who presents for Medicare Annual (Subsequent) preventive examination.  Review of Systems      Cardiac Risk Factors include: advanced age (>50men, >65 women);hypertension     Objective:    There were no vitals filed for this visit. There is no height or weight on file to calculate BMI.     03/18/2022    1:08 PM 11/16/2020   12:59 PM 09/14/2019    1:33 PM 06/05/2018   11:55 AM 05/06/2018   10:44 AM 11/03/2017    1:52 PM 09/16/2017    7:30 PM  Advanced Directives  Does Patient Have a Medical Advance Directive? Yes Yes Yes Yes Yes Yes Yes  Type of Paramedic of [place];Living will Healthcare Power of Attorney Living will;Healthcare Power of [place];Living will  [place];Living will [place];Living will  Does patient want to make changes to medical advance directive? No - Patient declined  No - Patient declined    No - Patient declined  Copy of [place] in Chart? Yes - validated most recent copy scanned in  chart (See row information) No - copy requested No - copy requested   Yes Yes    Current Medications (verified) Outpatient Encounter Medications as of 03/18/2022  Medication Sig   acetaminophen (TYLENOL) 325 MG tablet Take 650 mg by mouth every 6 (six) hours as needed for mild pain.   fluticasone (FLONASE) 50 MCG/ACT nasal spray Place 1 spray into both nostrils daily.   levothyroxine (SYNTHROID) 75 MCG tablet Take 1 tablet (75 mcg total) by mouth daily.   metoprolol tartrate (LOPRESSOR) 25 MG tablet Take 1 tablet (25 mg total) by mouth 2 (two) times daily.   Multiple Vitamins-Minerals (CENTRUM ADULTS PO) Take 1 tablet by mouth daily.   Propylene Glycol-Glycerin (SOOTHE OP) Place 1 drop into both eyes 2 (two) times daily as needed (dry eyes).   rosuvastatin (CRESTOR) 5 MG tablet Take 1 tablet (5 mg total) by mouth every Monday, Wednesday, and Friday.   No facility-administered encounter medications on file as of 03/18/2022.    Allergies (verified) Codeine and Quinolones   History: Past Medical History:  Diagnosis Date   Arthritis    ddd   Chronic headaches    imprved recently per patient    Emphysema of lung ([place])    asymptomatic. noted on CT   Family history of colon cancer    History of adenomatous polyp of colon    2015 precancerous polyp history- due spring 2020.  5 years   History of seizures    1980s few ??? seizure  activity  (vagal response)   Hypertension    Lung cancer ([place]) dx'd 07/2017   Palpitations    RBBB (right bundle branch block)    palpitations   Skin cancer    skin basal cell cancer   Past Surgical History:  Procedure Laterality Date   BLADDER SURGERY  1985   Long term incontinence. [facility]- used small intestine to build bladder   BREAST BIOPSY  2001   CATARACT EXTRACTION W/ INTRAOCULAR LENS  IMPLANT, BILATERAL     Intestinal blockage  1989 and 1990   bowel obstruction surgery   Lung cancer removal   08/2017   Right lower lobe   VIDEO ASSISTED  THORACOSCOPY (VATS)/WEDGE RESECTION Right 09/16/2017   Procedure: VIDEO ASSISTED THORACOSCOPY (VATS)/LUNG RESECTION;  Surgeon: [name], MD;  Location: [facility];  Service: Thoracic;  Laterality: Right;   VIDEO BRONCHOSCOPY N/A 09/16/2017   Procedure: VIDEO BRONCHOSCOPY;  Surgeon: [name], MD;  Location: [facility];  Service: Thoracic;  Laterality: N/A;   WRIST SURGERY     de quervains tenosynovitis. Dr. Maureen Ralphs actually did this.    Family History  Problem Relation Age of Onset   Dementia Mother    Heart attack Mother        age 74. after hip fracture.    Hypertension Mother    Aortic aneurysm Mother    Prostate cancer Father    Hypertension Father    Breast cancer Sister    Hypertension Sister    Heart attack Sister        63. former smoker   Hypertension Sister    Atrial fibrillation Sister    Aortic aneurysm Sister        s/p open heart repair   Heart attack Paternal Grandfather    Colon cancer Other        family History   Colon polyps Other        family history   Social History   Socioeconomic History   Marital status: Single    Spouse name: Not on file   Number of children: Not on file   Years of education: Not on file   Highest education level: Not on file  Occupational History   Occupation: Retired Pharmacist, hospital  Tobacco Use   Smoking status: Former    Packs/day: 1.00    Years: 40.00    Total pack years: 40.00    Types: Cigarettes    Quit date: 12/03/2008    Years since quitting: 13.2   Smokeless tobacco: Never  Vaping Use   Vaping Use: Never used  Substance and Sexual Activity   Alcohol use: Not Currently    Comment: hx  etoh  37 yrs   Drug use: Never   Sexual activity: Not Currently  Other Topics Concern   Not on file  Social History Narrative   Single. Lives alone.    Lived in [place] for some years to help with parents but otherwise in [place] most of life.       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   [employer]      [employer] for [employer], a lot of time with friends- book clubs, walking, reading, some gardening   Social Determinants of Health   Financial Resource Strain: Low Risk  (03/18/2022)   Overall Financial Resource Strain (CARDIA)    Difficulty of Paying Living Expenses: Not hard at all  Food Insecurity: No Food Insecurity (03/18/2022)   Hunger  Vital Sign    Worried About Charity fundraiser in the Last Year: Never true    Ran Out of Food in the Last Year: Never true  Transportation Needs: No Transportation Needs (03/18/2022)   PRAPARE - Hydrologist (Medical): No    Lack of Transportation (Non-Medical): No  Physical Activity: Sufficiently Active (03/18/2022)   Exercise Vital Sign    Days of Exercise per Week: 5 days    Minutes of Exercise per Session: 60 min  Stress: No Stress Concern Present (03/18/2022)   [place]    Feeling of Stress : Not at all  Social Connections: Moderately Integrated (03/18/2022)   Social Connection and Isolation Panel [NHANES]    Frequency of Communication with Friends and Family: More than three times a week    Frequency of Social Gatherings with Friends and Family: More than three times a week    Attends Religious Services: More than 4 times per year    Active Member of [employer] or Organizations: Yes    Attends Archivist Meetings: 1 to 4 times per year    Marital Status: Never married    Tobacco Counseling Counseling given: Not Answered   Clinical Intake:  Pre-visit preparation completed: Yes  Pain : No/denies pain     Nutritional Risks: None Diabetes: No  How often do you need to have someone help you when you read instructions, pamphlets, or other written materials from your doctor or pharmacy?: 1 - Never  Diabetic?no  Interpreter Needed?: No  Information entered by :: Charlott Rakes, LPN   Activities of  Daily Living    03/18/2022    1:12 PM  In your present state of health, do you have any difficulty performing the following activities:  Hearing? 0  Vision? 0  Difficulty concentrating or making decisions? 0  Walking or climbing stairs? 0  Dressing or bathing? 0  Doing errands, shopping? 0  Preparing Food and eating ? N  Using the Toilet? N  In the past six months, have you accidently leaked urine? N  Do you have problems with loss of bowel control? N  Managing your Medications? N  Managing your Finances? N  Housekeeping or managing your Housekeeping? N    Patient Care Team: [name], MD as PCP - General (Family Medicine) Nahser, Wonda Cheng, MD as PCP - Cardiology (Cardiology) [name], MD (Inactive) as Consulting Physician (Cardiothoracic Surgery) Curt Bears, MD as Consulting Physician (Oncology) Ranell Patrick Clide Deutscher, MD as Consulting Physician (Physical Medicine and Rehabilitation) Armbruster, Carlota Raspberry, MD as Consulting Physician (Gastroenterology) Danice Goltz, MD as Consulting Physician (Ophthalmology) Renato Shin, MD (Inactive) as Consulting Physician (Endocrinology)  Indicate any recent Medical Services you may have received from other than [facility] providers in the past year (date may be approximate).     Assessment:   This is a routine wellness examination for Margaret Charles.  Hearing/Vision screen Hearing Screening - Comments:: Pt denies any hearing issues  Vision Screening - Comments:: Pt follows up with Dr Brigitte Pulse  Dietary issues and exercise activities discussed: Current Exercise Habits: Home exercise routine, Type of exercise: Other - see comments;walking, Time (Minutes): 60, Frequency (Times/Week): 5, Weekly Exercise (Minutes/Week): 300   Goals Addressed             This Visit's Progress    Patient Stated       Maintain health  Depression Screen    03/18/2022    1:09 PM 04/02/2021    8:15 AM 11/16/2020   12:58 PM  11/18/2019   10:14 AM 10/15/2019    2:23 PM 09/14/2019    1:34 PM 03/26/2019    8:02 AM  PHQ 2/9 Scores  PHQ - 2 Score 0 0 0 0 0 0 0    Fall Risk    03/18/2022    1:12 PM 04/02/2021    8:15 AM 11/16/2020    1:01 PM 11/18/2019   10:14 AM 10/15/2019    2:23 PM  Fall Risk   Falls in the past year? 0 0 1 0 0  Number falls in past yr: 0 0 1    Injury with Fall? 0 0 1    Comment   bruised ribs    Risk for fall due to : No Fall Risks;Impaired vision No Fall Risks     Follow up Falls prevention discussed Falls evaluation completed Falls prevention discussed      FALL RISK PREVENTION PERTAINING TO THE HOME:  Any stairs in or around the home? No  If so, are there any without handrails? No  Home free of loose throw rugs in walkways, pet beds, electrical cords, etc? Yes  Adequate lighting in your home to reduce risk of falls? Yes   ASSISTIVE DEVICES UTILIZED TO PREVENT FALLS:  Life alert? Yes apple watch  Use of a cane, walker or w/c? No  Grab bars in the bathroom? No  Shower chair or bench in shower? Yes  Elevated toilet seat or a handicapped toilet? No   TIMED UP AND GO:  Was the test performed? No .   Cognitive Function:        03/18/2022    1:12 PM 11/16/2020    1:04 PM 09/14/2019    1:33 PM  6CIT Screen  What Year? 0 points 0 points 0 points  What month? 0 points 0 points 0 points  What time? 0 points 0 points 0 points  Count back from 20 0 points 0 points 0 points  Months in reverse 0 points 0 points 0 points  Repeat phrase 0 points 0 points 0 points  Total Score 0 points 0 points 0 points    Immunizations Immunization History  Administered Date(s) Administered   Fluad Quad(high Dose 65+) 03/19/2017, 03/26/2019, 04/02/2021   Influenza Split 05/09/2011, 04/26/2012, 03/24/2014, 04/22/2014   Influenza Whole 03/19/2017   Influenza, High Dose Seasonal PF 04/24/2016, 03/20/2018   Influenza, Seasonal, Injecte, Preservative Fre 04/11/2015   Influenza-Unspecified  03/19/2017, 03/31/2017, 03/24/2020   Moderna SARS-COV2 Booster Vaccination 03/23/2021   Moderna Sars-Covid-2 Vaccination 07/26/2019, 08/24/2019, 11/03/2020   PPD Test 09/13/2014   Pneumococcal Conjugate-13 06/15/2015   Pneumococcal Polysaccharide-23 03/19/2017   Tdap 09/15/2014   Zoster Recombinat (Shingrix) 03/13/2018, 05/12/2019, 10/26/2019   Zoster, Live 06/11/2013    TDAP status: Up to date  Flu Vaccine status: Due, Education has been provided regarding the importance of this vaccine. Advised may receive this vaccine at local pharmacy or Health Dept. Aware to provide a copy of the vaccination record if obtained from local pharmacy or Health Dept. Verbalized acceptance and understanding.  Pneumococcal vaccine status: Up to date  Covid-19 vaccine status: Completed vaccines  Qualifies for Shingles Vaccine? Yes   Zostavax completed Yes   Shingrix Completed?: Yes  Screening Tests Health Maintenance  Topic Date Due   COVID-19 Vaccine (4 - Moderna risk series) 05/18/2021   INFLUENZA VACCINE  01/22/2022  COLONOSCOPY (Pts 45-78yrs Insurance coverage will need to be confirmed)  05/05/2022   MAMMOGRAM  08/23/2023   TETANUS/TDAP  09/14/2024   Pneumonia Vaccine 71+ Years old  Completed   DEXA SCAN  Completed   Hepatitis C Screening  Completed   Zoster Vaccines- Shingrix  Completed   HPV VACCINES  Aged Out    Health Maintenance  Health Maintenance Due  Topic Date Due   COVID-19 Vaccine (4 - Moderna risk series) 05/18/2021   INFLUENZA VACCINE  01/22/2022    Colorectal cancer screening: Type of screening: Colonoscopy. Completed 05/06/19. Repeat every 3 years  Mammogram status: Completed 08/22/21. Repeat every year  Bone Density status: Completed 04/10/20. Results reflect: Bone density results: OSTEOPENIA. Repeat every as directed  years.    Additional Screening:  Hepatitis C Screening:  Completed 02/20/16  Vision Screening: Recommended annual ophthalmology exams for early  detection of glaucoma and other disorders of the eye. Is the patient up to date with their annual eye exam?  Yes  Who is the provider or what is the name of the office in which the patient attends annual eye exams? Dr Manuella Ghazi If pt is not established with a provider, would they like to be referred to a provider to establish care? No .   Dental Screening: Recommended annual dental exams for proper oral hygiene  Community Resource Referral / Chronic Care Management: CRR required this visit?  No   CCM required this visit?  No      Plan:     I have personally reviewed and noted the following in the patient's chart:   Medical and social history Use of alcohol, tobacco or illicit drugs  Current medications and supplements including opioid prescriptions. Patient is not currently taking opioid prescriptions. Functional ability and status Nutritional status Physical activity Advanced directives List of other physicians Hospitalizations, surgeries, and ER visits in previous 12 months Vitals Screenings to include cognitive, depression, and falls Referrals and appointments  In addition, I have reviewed and discussed with patient certain preventive protocols, quality metrics, and best practice recommendations. A written personalized care plan for preventive services as well as general preventive health recommendations were provided to patient.     Willette Brace, LPN   [id number]   Nurse Notes: none

## 2022-03-18 NOTE — Patient Instructions (Signed)
[name] , Thank you for taking time to come for your Medicare Wellness Visit. I appreciate your ongoing commitment to your health goals. Please review the following plan we discussed and let me know if I can assist you in the future.   These are the goals we discussed:  Goals      Patient Stated     None at this time     Patient Stated     Maintain health         This is a list of the screening recommended for you and due dates:  Health Maintenance  Topic Date Due   COVID-19 Vaccine (4 - Moderna risk series) 05/18/2021   Flu Shot  01/22/2022   Colon Cancer Screening  05/05/2022   Mammogram  08/23/2023   Tetanus Vaccine  09/14/2024   Pneumonia Vaccine  Completed   DEXA scan (bone density measurement)  Completed   Hepatitis C Screening: USPSTF Recommendation to screen - Ages 84-79 yo.  Completed   Zoster (Shingles) Vaccine  Completed   HPV Vaccine  Aged Out    Advanced directives: copies in chart   Conditions/risks identified: maintain health   Next appointment: Follow up in one year for your annual wellness visit    Preventive Care 65 Years and Older, Female Preventive care refers to lifestyle choices and visits with your health care provider that can promote health and wellness. What does preventive care include? A yearly physical exam. This is also called an annual well check. Dental exams once or twice a year. Routine eye exams. Ask your health care provider how often you should have your eyes checked. Personal lifestyle choices, including: Daily care of your teeth and gums. Regular physical activity. Eating a healthy diet. Avoiding tobacco and drug use. Limiting alcohol use. Practicing safe sex. Taking low-dose aspirin every day. Taking vitamin and mineral supplements as recommended by your health care provider. What happens during an annual well check? The services and screenings done by your health care provider during your annual well check will depend on  your age, overall health, lifestyle risk factors, and family history of disease. Counseling  Your health care provider may ask you questions about your: Alcohol use. Tobacco use. Drug use. Emotional well-being. Home and relationship well-being. Sexual activity. Eating habits. History of falls. Memory and ability to understand (cognition). Work and work Statistician. Reproductive health. Screening  You may have the following tests or measurements: Height, weight, and BMI. Blood pressure. Lipid and cholesterol levels. These may be checked every 5 years, or more frequently if you are over 60 years old. Skin check. Lung cancer screening. You may have this screening every year starting at age 39 if you have a 30-pack-year history of smoking and currently smoke or have quit within the past 15 years. Fecal occult blood test (FOBT) of the stool. You may have this test every year starting at age 53. Flexible sigmoidoscopy or colonoscopy. You may have a sigmoidoscopy every 5 years or a colonoscopy every 10 years starting at age 46. Hepatitis C blood test. Hepatitis B blood test. Sexually transmitted disease (STD) testing. Diabetes screening. This is done by checking your blood sugar (glucose) after you have not eaten for a while (fasting). You may have this done every 1-3 years. Bone density scan. This is done to screen for osteoporosis. You may have this done starting at age 26. Mammogram. This may be done every 1-2 years. Talk to your health care provider about how often  you should have regular mammograms. Talk with your health care provider about your test results, treatment options, and if necessary, the need for more tests. Vaccines  Your health care provider may recommend certain vaccines, such as: Influenza vaccine. This is recommended every year. Tetanus, diphtheria, and acellular pertussis (Tdap, Td) vaccine. You may need a Td booster every 10 years. Zoster vaccine. You may need this  after age 37. Pneumococcal 13-valent conjugate (PCV13) vaccine. One dose is recommended after age 66. Pneumococcal polysaccharide (PPSV23) vaccine. One dose is recommended after age 32. Talk to your health care provider about which screenings and vaccines you need and how often you need them. This information is not intended to replace advice given to you by your health care provider. Make sure you discuss any questions you have with your health care provider. Document Released: 07/07/2015 Document Revised: 02/28/2016 Document Reviewed: 04/11/2015 Elsevier Interactive Patient Education  2017 [place] Prevention in the Home Falls can cause injuries. They can happen to people of all ages. There are many things you can do to make your home safe and to help prevent falls. What can I do on the outside of my home? Regularly fix the edges of walkways and driveways and fix any cracks. Remove anything that might make you trip as you walk through a door, such as a raised step or threshold. Trim any bushes or trees on the path to your home. Use bright outdoor lighting. Clear any walking paths of anything that might make someone trip, such as rocks or tools. Regularly check to see if handrails are loose or broken. Make sure that both sides of any steps have handrails. Any raised decks and porches should have guardrails on the edges. Have any leaves, snow, or ice cleared regularly. Use sand or salt on walking paths during winter. Clean up any spills in your garage right away. This includes oil or grease spills. What can I do in the bathroom? Use night lights. Install grab bars by the toilet and in the tub and shower. Do not use towel bars as grab bars. Use non-skid mats or decals in the tub or shower. If you need to sit down in the shower, use a plastic, non-slip stool. Keep the floor dry. Clean up any water that spills on the floor as soon as it happens. Remove soap buildup in the tub or  shower regularly. Attach bath mats securely with double-sided non-slip rug tape. Do not have throw rugs and other things on the floor that can make you trip. What can I do in the bedroom? Use night lights. Make sure that you have a light by your bed that is easy to reach. Do not use any sheets or blankets that are too big for your bed. They should not hang down onto the floor. Have a firm chair that has side arms. You can use this for support while you get dressed. Do not have throw rugs and other things on the floor that can make you trip. What can I do in the kitchen? Clean up any spills right away. Avoid walking on wet floors. Keep items that you use a lot in easy-to-reach places. If you need to reach something above you, use a strong step stool that has a grab bar. Keep electrical cords out of the way. Do not use floor polish or wax that makes floors slippery. If you must use wax, use non-skid floor wax. Do not have throw rugs and other things on  the floor that can make you trip. What can I do with my stairs? Do not leave any items on the stairs. Make sure that there are handrails on both sides of the stairs and use them. Fix handrails that are broken or loose. Make sure that handrails are as long as the stairways. Check any carpeting to make sure that it is firmly attached to the stairs. Fix any carpet that is loose or worn. Avoid having throw rugs at the top or bottom of the stairs. If you do have throw rugs, attach them to the floor with carpet tape. Make sure that you have a light switch at the top of the stairs and the bottom of the stairs. If you do not have them, ask someone to add them for you. What else can I do to help prevent falls? Wear shoes that: Do not have high heels. Have rubber bottoms. Are comfortable and fit you well. Are closed at the toe. Do not wear sandals. If you use a stepladder: Make sure that it is fully opened. Do not climb a closed stepladder. Make  sure that both sides of the stepladder are locked into place. Ask someone to hold it for you, if possible. Clearly mark and make sure that you can see: Any grab bars or handrails. First and last steps. Where the edge of each step is. Use tools that help you move around (mobility aids) if they are needed. These include: Canes. Walkers. Scooters. Crutches. Turn on the lights when you go into a dark area. Replace any light bulbs as soon as they burn out. Set up your furniture so you have a clear path. Avoid moving your furniture around. If any of your floors are uneven, fix them. If there are any pets around you, be aware of where they are. Review your medicines with your doctor. Some medicines can make you feel dizzy. This can increase your chance of falling. Ask your doctor what other things that you can do to help prevent falls. This information is not intended to replace advice given to you by your health care provider. Make sure you discuss any questions you have with your health care provider. Document Released: 04/06/2009 Document Revised: 11/16/2015 Document Reviewed: 07/15/2014 Elsevier Interactive Patient Education  2017 [employer].

## 2022-03-28 DIAGNOSIS — M25512 Pain in left shoulder: Secondary | ICD-10-CM | POA: Diagnosis not present

## 2022-03-28 DIAGNOSIS — M25612 Stiffness of left shoulder, not elsewhere classified: Secondary | ICD-10-CM | POA: Diagnosis not present

## 2022-03-29 ENCOUNTER — Encounter: Payer: Self-pay | Admitting: Gastroenterology

## 2022-04-04 ENCOUNTER — Encounter: Payer: Medicare PPO | Admitting: Family Medicine

## 2022-04-15 ENCOUNTER — Encounter: Payer: Self-pay | Admitting: Gastroenterology

## 2022-04-18 ENCOUNTER — Encounter: Payer: Self-pay | Admitting: Gastroenterology

## 2022-04-18 ENCOUNTER — Ambulatory Visit (AMBULATORY_SURGERY_CENTER): Payer: Self-pay | Admitting: *Deleted

## 2022-04-18 VITALS — Ht 67.0 in | Wt 144.8 lb

## 2022-04-18 DIAGNOSIS — Z86010 Personal history of colonic polyps: Secondary | ICD-10-CM

## 2022-04-18 MED ORDER — NA SULFATE-K SULFATE-MG SULF 17.5-3.13-1.6 GM/177ML PO SOLN: 1.0000 | Freq: Once | ORAL | 0 refills | Status: AC

## 2022-04-18 NOTE — Progress Notes (Signed)
No egg or soy allergy known to patient  No issues known to pt with past sedation with any surgeries or procedures Patient denies ever being told they had issues or difficulty with intubation  No FH of Malignant Hyperthermia Pt is not on diet pills Pt is not on  home 02  Pt is not on blood thinners  Pt denies issues with constipation  H/O A fib no A flutter after lung surgery 2019 Have any cardiac testing pending--NO Pt instructed to use Singlecare.com or GoodRx for a price reduction on prep    Patient's chart reviewed by Osvaldo Angst CNRA prior to previsit and patient appropriate for the [place].  Previsit completed and red dot placed by patient's name on their procedure day (on provider's schedule).

## 2022-04-25 ENCOUNTER — Encounter: Payer: Self-pay | Admitting: Family Medicine

## 2022-04-25 ENCOUNTER — Ambulatory Visit (INDEPENDENT_AMBULATORY_CARE_PROVIDER_SITE_OTHER): Payer: Medicare PPO | Admitting: Family Medicine

## 2022-04-25 VITALS — BP 112/70 | HR 58 | Temp 97.7°F | Ht 67.0 in | Wt 140.0 lb

## 2022-04-25 DIAGNOSIS — I10 Essential (primary) hypertension: Secondary | ICD-10-CM | POA: Diagnosis not present

## 2022-04-25 DIAGNOSIS — M8588 Other specified disorders of bone density and structure, other site: Secondary | ICD-10-CM | POA: Diagnosis not present

## 2022-04-25 DIAGNOSIS — Z0000 Encounter for general adult medical examination without abnormal findings: Secondary | ICD-10-CM

## 2022-04-25 DIAGNOSIS — E039 Hypothyroidism, unspecified: Secondary | ICD-10-CM

## 2022-04-25 DIAGNOSIS — I7121 Aneurysm of the ascending aorta, without rupture: Secondary | ICD-10-CM

## 2022-04-25 DIAGNOSIS — F1021 Alcohol dependence, in remission: Secondary | ICD-10-CM | POA: Diagnosis not present

## 2022-04-25 DIAGNOSIS — Z87891 Personal history of nicotine dependence: Secondary | ICD-10-CM

## 2022-04-25 DIAGNOSIS — E041 Nontoxic single thyroid nodule: Secondary | ICD-10-CM

## 2022-04-25 DIAGNOSIS — J439 Emphysema, unspecified: Secondary | ICD-10-CM

## 2022-04-25 LAB — URINALYSIS, ROUTINE W REFLEX MICROSCOPIC
Bilirubin Urine: NEGATIVE
Hgb urine dipstick: NEGATIVE
Ketones, ur: NEGATIVE
Leukocytes,Ua: NEGATIVE
Nitrite: NEGATIVE
RBC / HPF: NONE SEEN (ref 0–?)
Specific Gravity, Urine: 1.01 (ref 1.000–1.030)
Total Protein, Urine: NEGATIVE
Urine Glucose: NEGATIVE
Urobilinogen, UA: 0.2 (ref 0.0–1.0)
pH: 7 (ref 5.0–8.0)

## 2022-04-25 LAB — CBC WITH DIFFERENTIAL/PLATELET
Basophils Absolute: 0.1 10*3/uL (ref 0.0–0.1)
Basophils Relative: 0.6 % (ref 0.0–3.0)
Eosinophils Absolute: 0.3 10*3/uL (ref 0.0–0.7)
Eosinophils Relative: 2.8 % (ref 0.0–5.0)
HCT: 37.7 % (ref 36.0–46.0)
Hemoglobin: 12.6 g/dL (ref 12.0–15.0)
Lymphocytes Relative: 25.2 % (ref 12.0–46.0)
Lymphs Abs: 2.3 10*3/uL (ref 0.7–4.0)
MCHC: 33.3 g/dL (ref 30.0–36.0)
MCV: 88.5 fl (ref 78.0–100.0)
Monocytes Absolute: 0.8 10*3/uL (ref 0.1–1.0)
Monocytes Relative: 9.4 % (ref 3.0–12.0)
Neutro Abs: 5.6 10*3/uL (ref 1.4–7.7)
Neutrophils Relative %: 62 % (ref 43.0–77.0)
Platelets: 279 10*3/uL (ref 150.0–400.0)
RBC: 4.26 Mil/uL (ref 3.87–5.11)
RDW: 13.9 % (ref 11.5–15.5)
WBC: 9 10*3/uL (ref 4.0–10.5)

## 2022-04-25 LAB — COMPREHENSIVE METABOLIC PANEL
ALT: 18 U/L (ref 0–35)
AST: 21 U/L (ref 0–37)
Albumin: 4.1 g/dL (ref 3.5–5.2)
Alkaline Phosphatase: 103 U/L (ref 39–117)
BUN: 16 mg/dL (ref 6–23)
CO2: 29 mEq/L (ref 19–32)
Calcium: 9.7 mg/dL (ref 8.4–10.5)
Chloride: 102 mEq/L (ref 96–112)
Creatinine, Ser: 0.67 mg/dL (ref 0.40–1.20)
GFR: 87.21 mL/min (ref >60.00)
Glucose, Bld: 96 mg/dL (ref 70–99)
Potassium: 4.7 mEq/L (ref 3.5–5.1)
Sodium: 138 mEq/L (ref 135–145)
Total Bilirubin: 0.6 mg/dL (ref 0.2–1.2)
Total Protein: 7.2 g/dL (ref 6.0–8.3)

## 2022-04-25 LAB — LIPID PANEL
Cholesterol: 144 mg/dL (ref 0–200)
HDL: 69.2 mg/dL (ref >39.00)
LDL Cholesterol: 60 mg/dL (ref 0–99)
NonHDL: 75.02
Total CHOL/HDL Ratio: 2
Triglycerides: 73 mg/dL (ref 0.0–149.0)
VLDL: 14.6 mg/dL (ref 0.0–40.0)

## 2022-04-25 NOTE — Patient Instructions (Addendum)
Please stop by lab before you go If you have mychart- we will send your results within 3 business days of [place] receiving them.  If you do not have mychart- we will call you about results within 5 business days of [place] receiving them.  *please also note that you will see labs on mychart as soon as they post. I will later go in and write notes on them- will say "notes from Dr. Yong Channel"   We will call you within two weeks about your referral to thyroid ultrasound. If you do not hear within 2 weeks, give [place] a call.   Schedule your bone density test at check out desk.  - located [address] across the street from [place] - in the basement - you DO NEED an appointment for the bone density tests.   Recommended follow up: Return in about 1 year (around 04/26/2023) for physical or sooner if needed.Schedule b4 you leave.

## 2022-04-25 NOTE — Progress Notes (Signed)
Phone [phone]   Subjective:  Patient presents today for their annual physical. Chief complaint-noted.   See problem oriented charting- ROS- full  review of systems was completed and negative except for: back pain, neck pain, intercostal nerve pain, allergies, headaches about the same- neck tension related  The following were reviewed and entered/updated in epic: Past Medical History:  Diagnosis Date   Allergy    SEASONAL   Arthritis    ddd   Cataract    BILATERAL,REMOVED   Chronic headaches    imprved recently per patient    Emphysema of lung ([place])    asymptomatic. noted on CT   Family history of colon cancer    GERD (gastroesophageal reflux disease)    History of adenomatous polyp of colon    2015 precancerous polyp history- due spring 2020.  5 years   History of seizures    1980s few ??? seizure activity  (vagal response)LAST ONE IN THE 1980'S NONE SINCE UPDATED 04/18/22   Hyperlipidemia    Hypertension    Lung cancer ([place]) dx'd 07/2017   Neuromuscular disorder ([place])    NERVE DAMAGE   Palpitations    RBBB (right bundle branch block)    palpitations   Seizures (HCC)    Skin cancer    skin basal cell cancer   Substance abuse ([place])    ETOH,ABUSE SOBER FOR 40 YEARS UPDATED 04/18/22   Thyroid disease    HYPO   Patient Active Problem List   Diagnosis Date Noted   Ascending aortic aneurysm ([place]) 08/27/2018    Priority: High   History of atrial fibrillation 12/29/2017    Priority: High   Alcoholism in remission ([place]) 12/29/2017    Priority: High   Emphysema of lung ([place])     Priority: High   S/P lobectomy of lung 09/16/2017    Priority: High   History of lung cancer- right lower lobe s/p right lobectomy 08/06/2017    Priority: High   History of adenomatous polyp of colon 05/11/2019    Priority: Medium    Hypothyroidism 04/30/2019    Priority: Medium    Hypertension 12/29/2017    Priority: Medium    History of skin cancer 12/29/2017    Priority: Medium     Chronic headaches     Priority: Medium    Left thyroid nodule 10/07/2017    Priority: Medium    Palpitations 03/25/2017    Priority: Medium    Benign paroxysmal positional vertigo of right ear 05/25/2018    Priority: Low   Lens replaced by other means 08/12/2017    Priority: Low   Tear film insufficiency 08/12/2017    Priority: Low   Vitreous degeneration 08/12/2017    Priority: Low   Cervicogenic headache 08/11/2017    Priority: Low   DDD (degenerative disc disease), cervical 08/11/2017    Priority: Low   Arthritis of facet joint of cervical spine 07/28/2017    Priority: Low   RBBB 07/11/2015    Priority: Low   Senile nuclear sclerosis 12/25/2011    Priority: Low   Myopia 12/10/2011    Priority: Low   Aortic atherosclerosis ([place]) 04/02/2021    Priority: 2.   Lesion of right lung 08/06/2017   Past Surgical History:  Procedure Laterality Date   BLADDER SURGERY  1985   Long term incontinence. [facility]- used small intestine to build bladder   BREAST BIOPSY  2001   CATARACT EXTRACTION W/ INTRAOCULAR LENS  IMPLANT, BILATERAL  Intestinal blockage  1989 and 1990   bowel obstruction surgery   Lung cancer removal   08/2017   Right lower lobe   VIDEO ASSISTED THORACOSCOPY (VATS)/WEDGE RESECTION Right 09/16/2017   Procedure: VIDEO ASSISTED THORACOSCOPY (VATS)/LUNG RESECTION;  Surgeon: [name], MD;  Location: [facility];  Service: Thoracic;  Laterality: Right;   VIDEO BRONCHOSCOPY N/A 09/16/2017   Procedure: VIDEO BRONCHOSCOPY;  Surgeon: [name], MD;  Location: [facility];  Service: Thoracic;  Laterality: N/A;   WRIST SURGERY     de quervains tenosynovitis. Dr. Maureen Ralphs actually did this.     Family History  Problem Relation Age of Onset   Dementia Mother    Heart attack Mother        age 79. after hip fracture.    Hypertension Mother    Aortic aneurysm Mother    Prostate cancer Father    Hypertension Father    Colon polyps Father    Breast cancer Sister     Hypertension Sister    Heart attack Sister        60. former smoker   Hypertension Sister    Atrial fibrillation Sister    Aortic aneurysm Sister        died at 48 despite s/p open heart repair   Heart failure Sister    Heart attack Paternal Grandfather    Esophageal cancer Paternal Aunt    Colon cancer Other        uncle, cousin   Colon polyps Other        family history   Crohn's disease Neg Hx    Rectal cancer Neg Hx    Stomach cancer Neg Hx    Ulcerative colitis Neg Hx     Medications- reviewed and updated Current Outpatient Medications  Medication Sig Dispense Refill   acetaminophen (TYLENOL) 325 MG tablet Take 650 mg by mouth every 6 (six) hours as needed for mild pain.     fluticasone (FLONASE) 50 MCG/ACT nasal spray Place 1 spray into both nostrils daily.     levothyroxine (SYNTHROID) 75 MCG tablet Take 1 tablet (75 mcg total) by mouth daily. 90 tablet 3   metoprolol tartrate (LOPRESSOR) 25 MG tablet Take 1 tablet (25 mg total) by mouth 2 (two) times daily. 180 tablet 3   Multiple Vitamins-Minerals (CENTRUM ADULTS PO) Take 1 tablet by mouth daily.     Propylene Glycol-Glycerin (SOOTHE OP) Place 1 drop into both eyes 2 (two) times daily as needed (dry eyes).     rosuvastatin (CRESTOR) 5 MG tablet Take 1 tablet (5 mg total) by mouth every Monday, Wednesday, and Friday. 40 tablet 3   No current facility-administered medications for this visit.    Allergies-reviewed and updated Allergies  Allergen Reactions   Codeine Nausea And Vomiting   Quinolones     Patient was warned about not using Cipro and similar antibiotics. Recent studies have raised concern that fluoroquinolone antibiotics could be associated with an increased risk of aortic aneurysm Fluoroquinolones have non-antimicrobial properties that might jeopardise the integrity of the extracellular matrix of the vascular wall In a  propensity score matched cohort study in [place], there was a 66% increased rate of  aortic aneurysm or dissection associated with oral fluoroquinolone use, compared wit    Social History   Social History Narrative   Single. Lives alone.    Lived in [place] for some years to help with parents but otherwise in [place] most of life.  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 Oropharynx normal Neck: no thyromegaly CV: RRR no murmurs rubs or gallops Lungs: CTAB no crackles, wheeze, rhonchi Abdomen: soft/nontender/nondistended/normal bowel sounds. No rebound or guarding.  Ext: no edema Skin: warm, dry Neuro: grossly normal, moves all extremities, PERRLA   Assessment and Plan   71 y.o. female presenting for annual physical.  Health Maintenance counseling: 1. Anticipatory guidance: Patient counseled regarding regular dental exams -q6 months, eye exams - yearly,  avoiding smoking and second hand smoke , limiting alcohol to 1 beverage per day- doesn't drink at all due to history , no illicit drugs .   2. Risk factor reduction:  Advised patient of need for regular exercise and diet rich and fruits and vegetables to reduce risk of heart attack and stroke.  Exercise- water aerobics and walks 3 days a week 5 miles.  Diet/weight management-healthy weight- would avoid weight loss- eats reasonably healthy diet.  Wt Readings from Last 3 Encounters:  04/25/22 140 lb (63.5 kg)  04/18/22 144 lb 12.8 oz (65.7 kg)  12/11/21 142 lb (64.4 kg)  3. Immunizations/screenings/ancillary studies- prevnar 19 wants to consider another year, plans on rsv at  pharmacy Immunization History  Administered Date(s) Administered   Fluad Quad(high Dose 65+) 03/19/2017, 03/26/2019, 04/02/2021   Influenza Split 05/09/2011, 04/26/2012, 03/24/2014, 04/22/2014   Influenza Whole 03/19/2017   Influenza, High Dose Seasonal PF 04/24/2016, 03/20/2018, 03/28/2022   Influenza, Seasonal, Injecte, Preservative Fre 04/11/2015   Influenza-Unspecified 03/19/2017, 03/31/2017, 03/24/2020   Moderna SARS-COV2 Booster Vaccination 03/23/2021   Moderna Sars-Covid-2 Vaccination 07/26/2019, 08/24/2019, 11/03/2020   PPD Test 09/13/2014   Pfizer Covid-19 Vaccine Bivalent Booster 49yrs & up 03/23/2022   Pneumococcal Conjugate-13 06/15/2015   Pneumococcal Polysaccharide-23 03/19/2017   Tdap 09/15/2014   Zoster Recombinat (Shingrix) 03/13/2018, 05/12/2019, 10/26/2019   Zoster, Live 06/11/2013   4. Cervical cancer screening- no history of abnormal Pap smear.  Past age based screening recommendations  5. Breast cancer screening- breast exam normal  and mammogram 08/22/2021- with sisters history will continue annually 6. Colon cancer screening - Colonoscopy within our system May 06, 2019 with plan for 3-year repeat due to history of polyps-she is already scheduled 7. Skin cancer screening- see skin surgery center yearly due to history of skin  cancer. advised regular sunscreen use. Denies worrisome, changing, or new skin lesions.  8. Birth control/STD check-  postmenopausal.  Single/not sexually active 9. Osteoporosis screening at 2- osteopenia 2017 with worst T score -2.0 and lumbar spine.  repeat 04/10/20 with hip score 4.2% - wants to hold off on fosamax but doing calciu/vitm D/weight bearing exercise-discussed DEXA- will update  -Former smoker - quit smoking in 2010-history of lung cancer.  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 Still full code.   #History of lung cancer-right lower lobe lobectomy March 2019 continues to follow with Dr. Roxan Hockey and oncology Dr. Julien Nordmann.  Patient was recently seen in June by both and with updated scan.  Repeat scan planned June 2024   # intercostal neuralgia -per pain management in past post surgery- Was referred to neurosurgery in 2023and saw Dr. Davy Pique but nothing particularly helped- injection, epidural, peripheral nerve stimulator. Later tried accupuncture and didn't work. Staying still helps    #Ascending aortic aneurysm 4.2 cm-plans for annual CT scan- 4.1 cm in 2023.   Follows with Dr. Roxan Hockey and Nahser as well   #Alcoholism in remission (HCC)-congratulated patient on remaining alcohol free still!   #hypertension/palpitations S: medication: Metoprolol 25Mg  BID.  No palpitations on metoprolol recently.  Home readings #s: 120/70 typically BP Readings from Last 3 Encounters:  04/25/22 112/70  12/11/21 130/75  12/11/21 125/79  A/P: HTN and palpitations- Controlled. Continue current medications.   #hypothyroidism-followed with Dr. Loanne Drilling previously S: compliant On thyroid medication-Synthroid 67mcg  -[place] 04/12/2020- was told 2 years Lab Results  Component Value Date   TSH 1.52 01/30/2022   A/P: due for thyroid ultrasound- update with labs. Controlled- continue current medicine.   #Pulmonary emphysema, unspecified emphysema type (HCC)-patient largely asymptomatic.  Simply noted on imaging  #History of atrial fibrillation-no recurrence other than time in hospital- on metoprolol in case goes into a fib   # post nasal drip/allergies - relief with flonase   #Osteopenia-noted 04/10/2020 with worst T score at right femoral neck of -1.8.  Recommended calcium and vitamin D.  Hip fracture risk at 4.2% and offered medicine like  Fosamax- reassess with updating dexa  Recommended follow up: Return in about 1 year (around 04/26/2023) for physical or sooner if needed.Schedule b4 you leave. Future Appointments  Date Time Provider [place]  05/03/2022 11:30 AM Armbruster, Carlota Raspberry, MD [facility]  12/03/2022 11:00 AM [facility] LAB [facility] None  12/05/2022  1:15 PM Curt Bears, MD [facility] None  03/24/2023  3:00 PM [facility] HEALTH COACH [facility]   Lab/Order associations: fasting   ICD-10-CM   1. Preventative health care  Z00.00     2. Former smoker  Z87.891     3. Pulmonary emphysema, unspecified emphysema type ([place])  J43.9     4. Aneurysm of ascending aorta without rupture (HCC)  I71.21     5. Alcoholism in remission ([place])  F10.21     6. Acquired hypothyroidism  E03.9     7. Primary hypertension  I10     8. Thyroid nodule  E04.1       No orders of the defined types were placed in this encounter.   Return precautions advised.  Garret Reddish, MD

## 2022-04-26 ENCOUNTER — Other Ambulatory Visit: Payer: Self-pay | Admitting: Family Medicine

## 2022-05-03 ENCOUNTER — Ambulatory Visit (AMBULATORY_SURGERY_CENTER): Payer: Medicare PPO | Admitting: Gastroenterology

## 2022-05-03 ENCOUNTER — Encounter: Payer: Self-pay | Admitting: Gastroenterology

## 2022-05-03 VITALS — BP 128/72 | HR 64 | Temp 96.8°F | Resp 18 | Ht 67.0 in | Wt 144.8 lb

## 2022-05-03 DIAGNOSIS — K621 Rectal polyp: Secondary | ICD-10-CM | POA: Diagnosis not present

## 2022-05-03 DIAGNOSIS — D122 Benign neoplasm of ascending colon: Secondary | ICD-10-CM

## 2022-05-03 DIAGNOSIS — D123 Benign neoplasm of transverse colon: Secondary | ICD-10-CM

## 2022-05-03 DIAGNOSIS — Z86010 Personal history of colonic polyps: Secondary | ICD-10-CM

## 2022-05-03 DIAGNOSIS — J449 Chronic obstructive pulmonary disease, unspecified: Secondary | ICD-10-CM | POA: Diagnosis not present

## 2022-05-03 DIAGNOSIS — D128 Benign neoplasm of rectum: Secondary | ICD-10-CM

## 2022-05-03 DIAGNOSIS — Z09 Encounter for follow-up examination after completed treatment for conditions other than malignant neoplasm: Secondary | ICD-10-CM

## 2022-05-03 DIAGNOSIS — I10 Essential (primary) hypertension: Secondary | ICD-10-CM | POA: Diagnosis not present

## 2022-05-03 MED ORDER — SODIUM CHLORIDE 0.9 % IV SOLN: 500.0000 mL | INTRAVENOUS | Status: AC

## 2022-05-03 NOTE — Progress Notes (Signed)
Report to PACU, RN, vss, BBS= Clear.  

## 2022-05-03 NOTE — Op Note (Signed)
[name]: Margaret Charles Procedure Date: 05/03/2022 11:43 AM MRN: [id number] Endoscopist: Remo Lipps P. Havery Moros , MD, [id number] Age: 71 Referring MD:  Date of Birth: 1950-08-11 Gender: Female Account #: [id number] Procedure:                Colonoscopy Indications:              High risk colon cancer surveillance: Personal                            history of colonic polyps - last exam 04/2019 - 5                            polyps including one advanced lesion Medicines:                Monitored Anesthesia Care Procedure:                Pre-Anesthesia Assessment:                           - Prior to the procedure, a History and Physical                            was performed, and patient medications and                            allergies were reviewed. The patient's tolerance of                            previous anesthesia was also reviewed. The risks                            and benefits of the procedure and the sedation                            options and risks were discussed with the patient.                            All questions were answered, and informed consent                            was obtained. Prior Anticoagulants: The patient has                            taken no anticoagulant or antiplatelet agents. ASA                            Grade Assessment: III - A patient with severe                            systemic disease. After reviewing the risks and                            benefits, the patient was deemed in satisfactory  condition to undergo the procedure.                           After obtaining informed consent, the colonoscope                            was passed under direct vision. Throughout the                            procedure, the patient's blood pressure, pulse, and                            oxygen saturations were monitored continuously. The                            Olympus  PCF-H190DL (#[id number]) Colonoscope was                            introduced through the anus and advanced to the the                            cecum, identified by appendiceal orifice and                            ileocecal valve. The colonoscopy was performed                            without difficulty. The patient tolerated the                            procedure well. The quality of the bowel                            preparation was adequate. The ileocecal valve,                            appendiceal orifice, and rectum were photographed. Scope In: 11:54:39 AM Scope Out: 12:25:35 PM Scope Withdrawal Time: 0 hours 20 minutes 45 seconds  Total Procedure Duration: 0 hours 30 minutes 56 seconds  Findings:                 The perianal and digital rectal examinations were                            normal.                           A few medium-mouthed diverticula were found in the                            left colon and right colon.                           Two flat polyps were found in the ascending colon.  The polyps were 2 to 6 mm in size. These polyps                            were removed with a cold snare. Resection and                            retrieval were complete.                           A 3 mm polyp was found in the hepatic flexure. The                            polyp was sessile. The polyp was removed with a                            cold snare. Resection and retrieval were complete.                           A 5 mm polyp was found in the transverse colon. The                            polyp was sessile. The polyp was removed with a                            cold snare. Resection and retrieval were complete.                           The colon was quite tortuous, cecal intubation was                            challenging.                           Internal hemorrhoids were found during                            retroflexion. The  hemorrhoids were small.                           A 3 mm polyp was found in the rectum. The polyp was                            sessile. The polyp was removed with a cold snare.                            Resection and retrieval were complete.                           The exam was otherwise without abnormality. Complications:            No immediate complications. Estimated blood loss:                            Minimal.  Estimated Blood Loss:     Estimated blood loss was minimal. Impression:               - Diverticulosis in the left colon and in the right                            colon.                           - Two 2 to 6 mm polyps in the ascending colon,                            removed with a cold snare. Resected and retrieved.                           - One 3 mm polyp at the hepatic flexure, removed                            with a cold snare. Resected and retrieved.                           - One 5 mm polyp in the transverse colon, removed                            with a cold snare. Resected and retrieved.                           - Tortuous colon.                           - Internal hemorrhoids.                           - One 3 mm polyp in the rectum, removed with a cold                            snare. Resected and retrieved.                           - The examination was otherwise normal. Recommendation:           - Patient has a contact number available for                            emergencies. The signs and symptoms of potential                            delayed complications were discussed with the                            patient. Return to normal activities tomorrow.                            Written discharge instructions were provided to the  patient.                           - Resume previous diet.                           - Continue present medications.                           - Await pathology results. Remo Lipps P.  Joshue Badal, MD 05/03/2022 12:32:17 PM This report has been signed electronically.

## 2022-05-03 NOTE — Patient Instructions (Signed)
Impression/Recommendations:  Polyp, diverticulosis, and hemorrhoid handouts given to patient.  Resume previous diet. Continue present medications. Await pathology results.  YOU HAD AN ENDOSCOPIC PROCEDURE TODAY AT [facility]:   Refer to the procedure report that was given to you for any specific questions about what was found during the examination.  If the procedure report does not answer your questions, please call your gastroenterologist to clarify.  If you requested that your care partner not be given the details of your procedure findings, then the procedure report has been included in a sealed envelope for you to review at your convenience later.  YOU SHOULD EXPECT: Some feelings of bloating in the abdomen. Passage of more gas than usual.  Walking can help get rid of the air that was put into your GI tract during the procedure and reduce the bloating. If you had a lower endoscopy (such as a colonoscopy or flexible sigmoidoscopy) you may notice spotting of blood in your stool or on the toilet paper. If you underwent a bowel prep for your procedure, you may not have a normal bowel movement for a few days.  Please Note:  You might notice some irritation and congestion in your nose or some drainage.  This is from the oxygen used during your procedure.  There is no need for concern and it should clear up in a day or so.  SYMPTOMS TO REPORT IMMEDIATELY:  Following lower endoscopy (colonoscopy or flexible sigmoidoscopy):  Excessive amounts of blood in the stool  Significant tenderness or worsening of abdominal pains  Swelling of the abdomen that is new, acute  Fever of 100F or higher For urgent or emergent issues, a gastroenterologist can be reached at any hour by calling [phone]. Do not use MyChart messaging for urgent concerns.    DIET:  We do recommend a small meal at first, but then you may proceed to your regular diet.  Drink plenty of fluids but you should  avoid alcoholic beverages for 24 hours.  ACTIVITY:  You should plan to take it easy for the rest of today and you should NOT DRIVE or use heavy machinery until tomorrow (because of the sedation medicines used during the test).    FOLLOW UP: Our staff will call the number listed on your records the next business day following your procedure.  We will call around 7:15- 8:00 am to check on you and address any questions or concerns that you may have regarding the information given to you following your procedure. If we do not reach you, we will leave a message.     If any biopsies were taken you will be contacted by phone or by letter within the next 1-3 weeks.  Please call us at [phone] if you have not heard about the biopsies in 3 weeks.    SIGNATURES/CONFIDENTIALITY: You and/or your care partner have signed paperwork which will be entered into your electronic medical record.  These signatures attest to the fact that that the information above on your After Visit Summary has been reviewed and is understood.  Full responsibility of the confidentiality of this discharge information lies with you and/or your care-partner.

## 2022-05-03 NOTE — Progress Notes (Signed)
[facility] History and Physical   Primary Care Physician:  [name], MD   Reason for Procedure:   Colon polyp surveillance  Plan:    colonoscopy     HPI: Margaret Charles is a 71 y.o. female  here for colonoscopy surveillance - 5 adenomas removed 04/2019, one advanced.   Patient denies any bowel symptoms at this time.  Otherwise feels well without any cardiopulmonary symptoms.   I have discussed risks / benefits of anesthesia and endoscopic procedure with [name] and they wish to proceed with the exams as outlined today.    Past Medical History:  Diagnosis Date   Allergy    SEASONAL   Arthritis    ddd   Cataract    BILATERAL,REMOVED   Chronic headaches    imprved recently per patient    Emphysema of lung ([place])    asymptomatic. noted on CT   Family history of colon cancer    GERD (gastroesophageal reflux disease)    History of adenomatous polyp of colon    2015 precancerous polyp history- due spring 2020.  5 years   History of seizures    1980s few ??? seizure activity  (vagal response)LAST ONE IN THE 1980'S NONE SINCE UPDATED 04/18/22   Hyperlipidemia    Hypertension    Lung cancer ([place]) dx'd 07/2017   Neuromuscular disorder ([place])    NERVE DAMAGE   Palpitations    RBBB (right bundle branch block)    palpitations   Seizures ([facility])    Skin cancer    skin basal cell cancer   Substance abuse ([place])    ETOH,ABUSE SOBER FOR 40 YEARS UPDATED 04/18/22   Thyroid disease    HYPO    Past Surgical History:  Procedure Laterality Date   BLADDER SURGERY  1985   Long term incontinence. [facility]- used small intestine to build bladder   BREAST BIOPSY  2001   CATARACT EXTRACTION W/ INTRAOCULAR LENS  IMPLANT, BILATERAL     Intestinal blockage  1989 and 1990   bowel obstruction surgery   Lung cancer removal   08/2017   Right lower lobe   VIDEO ASSISTED THORACOSCOPY (VATS)/WEDGE RESECTION Right 09/16/2017   Procedure: VIDEO ASSISTED THORACOSCOPY  (VATS)/LUNG RESECTION;  Surgeon: [name], MD;  Location: [facility];  Service: Thoracic;  Laterality: Right;   VIDEO BRONCHOSCOPY N/A 09/16/2017   Procedure: VIDEO BRONCHOSCOPY;  Surgeon: [name], MD;  Location: [facility];  Service: Thoracic;  Laterality: N/A;   WRIST SURGERY     de quervains tenosynovitis. Dr. Maureen Ralphs actually did this.     Prior to Admission medications   Medication Sig Start Date End Date Taking? Authorizing Provider  acetaminophen (TYLENOL) 325 MG tablet Take 650 mg by mouth every 6 (six) hours as needed for mild pain.   Yes [provider]  fluticasone (FLONASE) 50 MCG/ACT nasal spray Place 1 spray into both nostrils daily.   Yes [provider]  levothyroxine (SYNTHROID) 75 MCG tablet Take 1 tablet (75 mcg total) by mouth daily. 02/01/22  Yes [name], MD  metoprolol tartrate (LOPRESSOR) 25 MG tablet Take 1 tablet (25 mg total) by mouth 2 (two) times daily. 07/10/21  Yes Nahser, Wonda Cheng, MD  Propylene Glycol-Glycerin (SOOTHE OP) Place 1 drop into both eyes 2 (two) times daily as needed (dry eyes).   Yes [provider]  Multiple Vitamins-Minerals (CENTRUM ADULTS PO) Take 1 tablet by mouth daily.    [provider]  rosuvastatin (CRESTOR) 5 MG tablet TAKE 1 TABLET BY MOUTH EVERY MONDAY, WEDNESDAY, AND FRIDAY. 04/26/22   [name], MD    Current Outpatient Medications  Medication Sig Dispense Refill   acetaminophen (TYLENOL) 325 MG tablet Take 650 mg by mouth every 6 (six) hours as needed for mild pain.     fluticasone (FLONASE) 50 MCG/ACT nasal spray Place 1 spray into both nostrils daily.     levothyroxine (SYNTHROID) 75 MCG tablet Take 1 tablet (75 mcg total) by mouth daily. 90 tablet 3   metoprolol tartrate (LOPRESSOR) 25 MG tablet Take 1 tablet (25 mg total) by mouth 2 (two) times daily. 180 tablet 3   Propylene Glycol-Glycerin (SOOTHE OP) Place 1 drop into both eyes 2 (two) times daily as needed (dry  eyes).     Multiple Vitamins-Minerals (CENTRUM ADULTS PO) Take 1 tablet by mouth daily.     rosuvastatin (CRESTOR) 5 MG tablet TAKE 1 TABLET BY MOUTH EVERY MONDAY, WEDNESDAY, AND FRIDAY. 36 tablet 4   Current Facility-Administered Medications  Medication Dose Route Frequency Provider Last Rate Last Admin   0.9 %  sodium chloride infusion  500 mL Intravenous Continuous Jullia Mulligan, Carlota Raspberry, MD        Allergies as of 05/03/2022 - Review Complete 05/03/2022  Allergen Reaction Noted   Codeine Nausea And Vomiting 08/19/2017   Quinolones  08/27/2018    Family History  Problem Relation Age of Onset   Dementia Mother    Heart attack Mother        age 10. after hip fracture.    Hypertension Mother    Aortic aneurysm Mother    Prostate cancer Father    Hypertension Father    Colon polyps Father    Breast cancer Sister    Hypertension Sister    Heart attack Sister        84. former smoker   Hypertension Sister    Atrial fibrillation Sister    Aortic aneurysm Sister        died at 20 despite s/p open heart repair   Heart failure Sister    Heart attack Paternal Grandfather    Esophageal cancer Paternal Aunt    Colon cancer Other        uncle, cousin   Colon polyps Other        family history   Crohn's disease Neg Hx    Rectal cancer Neg Hx    Stomach cancer Neg Hx    Ulcerative colitis Neg Hx     Social History   Socioeconomic History   Marital status: Single    Spouse name: Not on file   Number of children: Not on file   Years of education: Not on file   Highest education level: Not on file  Occupational History   Occupation: Retired Pharmacist, hospital  Tobacco Use   Smoking status: Former    Packs/day: 1.00    Years: 40.00    Total pack years: 40.00    Types: Cigarettes    Quit date: 12/03/2008    Years since quitting: 13.4    Passive exposure: Past   Smokeless tobacco: Never  Vaping Use   Vaping Use: Never used  Substance and Sexual Activity   Alcohol use: Not Currently     Comment: hx  etoh  37 yrs   Drug use: Never   Sexual activity: Not Currently  Other Topics Concern   Not on file  Social History Narrative   Single. Lives alone.  Lived in [place] for some years to help with parents but otherwise in [place] most of life.       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 [employer]   [employer]      [place] [employer], a lot of time with friends- book clubs, walking, reading, some gardening   Social Determinants of Health   Financial Resource Strain: Low Risk  (03/18/2022)   Overall Financial Resource Strain (CARDIA)    Difficulty of Paying Living Expenses: Not hard at all  Food Insecurity: No Food Insecurity (03/18/2022)   Hunger Vital Sign    Worried About Running Out of Food in the Last Year: Never true    [place] in the Last Year: Never true  Transportation Needs: No Transportation Needs (03/18/2022)   PRAPARE - Hydrologist (Medical): No    Lack of Transportation (Non-Medical): No  Physical Activity: Sufficiently Active (03/18/2022)   Exercise Vital Sign    Days of Exercise per Week: 5 days    Minutes of Exercise per Session: 60 min  Stress: No Stress Concern Present (03/18/2022)   [place]    Feeling of Stress : Not at all  Social Connections: Moderately Integrated (03/18/2022)   Social Connection and Isolation Panel [NHANES]    Frequency of Communication with Friends and Family: More than three times a week    Frequency of Social Gatherings with Friends and Family: More than three times a week    Attends Religious Services: More than 4 times per year    Active Member of [employer] or Organizations: Yes    Attends Archivist Meetings: 1 to 4 times per year    Marital Status: Never married  Intimate Partner Violence: Not At Risk (03/18/2022)   Humiliation,  Afraid, Rape, and Kick questionnaire    Fear of Current or Ex-Partner: No    Emotionally Abused: No    Physically Abused: No    Sexually Abused: No    Review of Systems: All other review of systems negative except as mentioned in the HPI.  Physical Exam: Vital signs BP 129/78   Pulse 71   Temp (!) 96.8 F (36 C)   Ht 5\' 7"  (1.702 m)   Wt 144 lb 12.8 oz (65.7 kg)   SpO2 98%   BMI 22.68 kg/m   General:   Alert,  Well-developed, pleasant and cooperative in NAD Lungs:  Clear throughout to auscultation.   Heart:  Regular rate and rhythm Abdomen:  Soft, nontender and nondistended.   Neuro/Psych:  Alert and cooperative. Normal mood and affect. A and O x 3  Jolly Mango, MD [facility]

## 2022-05-03 NOTE — Progress Notes (Signed)
Vital signs checked by:CW  The patient states no changes in medical or surgical history since pre-visit screening on 04/18/22

## 2022-05-03 NOTE — Progress Notes (Signed)
Called to room to assist during endoscopic procedure.  Patient ID and intended procedure confirmed with present staff. Received instructions for my participation in the procedure from the performing physician.  

## 2022-05-06 ENCOUNTER — Telehealth: Payer: Self-pay

## 2022-05-06 NOTE — Telephone Encounter (Signed)
  Follow up Call-     05/03/2022   11:04 AM  Call back number  Post procedure Call Back phone  # [phone]  Permission to leave phone message Yes     Patient questions:  Do you have a fever, pain , or abdominal swelling? No. Pain Score  0 *  Have you tolerated food without any problems? Yes.    Have you been able to return to your normal activities? Yes.    Do you have any questions about your discharge instructions: Diet   No. Medications  No. Follow up visit  No.  Do you have questions or concerns about your Care? No.  Actions: * If pain score is 4 or above: No action needed, pain <4.

## 2022-05-07 ENCOUNTER — Ambulatory Visit
Admission: RE | Admit: 2022-05-07 | Discharge: 2022-05-07 | Disposition: A | Discharge status: Home / Self Care | Payer: Medicare PPO | Source: Ambulatory Visit | Attending: Family Medicine | Admitting: Family Medicine

## 2022-05-07 DIAGNOSIS — E041 Nontoxic single thyroid nodule: Secondary | ICD-10-CM

## 2022-05-07 DIAGNOSIS — E042 Nontoxic multinodular goiter: Secondary | ICD-10-CM | POA: Diagnosis not present

## 2022-05-09 ENCOUNTER — Ambulatory Visit (INDEPENDENT_AMBULATORY_CARE_PROVIDER_SITE_OTHER)
Admission: RE | Admit: 2022-05-09 | Discharge: 2022-05-09 | Disposition: A | Discharge status: Home / Self Care | Payer: Medicare PPO | Source: Ambulatory Visit | Attending: Family Medicine | Admitting: Family Medicine

## 2022-05-09 DIAGNOSIS — M8588 Other specified disorders of bone density and structure, other site: Secondary | ICD-10-CM | POA: Diagnosis not present

## 2022-05-30 DIAGNOSIS — L814 Other melanin hyperpigmentation: Secondary | ICD-10-CM | POA: Diagnosis not present

## 2022-05-30 DIAGNOSIS — L570 Actinic keratosis: Secondary | ICD-10-CM | POA: Diagnosis not present

## 2022-05-30 DIAGNOSIS — D225 Melanocytic nevi of trunk: Secondary | ICD-10-CM | POA: Diagnosis not present

## 2022-05-30 DIAGNOSIS — L821 Other seborrheic keratosis: Secondary | ICD-10-CM | POA: Diagnosis not present

## 2022-05-30 DIAGNOSIS — L309 Dermatitis, unspecified: Secondary | ICD-10-CM | POA: Diagnosis not present

## 2022-06-12 ENCOUNTER — Telehealth: Payer: Medicare PPO | Admitting: Physician Assistant

## 2022-06-12 DIAGNOSIS — U071 COVID-19: Secondary | ICD-10-CM

## 2022-06-12 MED ORDER — NIRMATRELVIR/RITONAVIR (PAXLOVID)TABLET: 3.0000 | ORAL_TABLET | Freq: Two times a day (BID) | ORAL | 0 refills | Status: AC

## 2022-06-12 MED ORDER — BENZONATATE 100 MG PO CAPS: 100.0000 mg | ORAL_CAPSULE | Freq: Three times a day (TID) | ORAL | 0 refills | Status: DC | PRN

## 2022-06-12 NOTE — Patient Instructions (Addendum)
[name], thank you for joining [name], PA-C for today's virtual visit.  While this provider is not your primary care provider (PCP), if your PCP is located in our provider database this encounter information will be shared with them immediately following your visit.   [facility] account gives you access to today's visit and all your visits, tests, and labs performed at [facility] " click here if you Charles't have a [facility] account or go to [email or web address]  Consent: (Patient) Margaret Charles provided verbal consent for this virtual visit at the beginning of the encounter.  Current Medications:  Current Outpatient Medications:    acetaminophen (TYLENOL) 325 MG tablet, Take 650 mg by mouth every 6 (six) hours as needed for mild pain., Disp: , Rfl:    fluticasone (FLONASE) 50 MCG/ACT nasal spray, Place 1 spray into both nostrils daily., Disp: , Rfl:    levothyroxine (SYNTHROID) 75 MCG tablet, Take 1 tablet (75 mcg total) by mouth daily., Disp: 90 tablet, Rfl: 3   metoprolol tartrate (LOPRESSOR) 25 MG tablet, Take 1 tablet (25 mg total) by mouth 2 (two) times daily., Disp: 180 tablet, Rfl: 3   Multiple Vitamins-Minerals (CENTRUM ADULTS PO), Take 1 tablet by mouth daily., Disp: , Rfl:    polyethylene glycol (MIRALAX / GLYCOLAX) 17 g packet, Take 17 g by mouth daily., Disp: , Rfl:    Propylene Glycol-Glycerin (SOOTHE OP), Place 1 drop into both eyes 2 (two) times daily as needed (dry eyes)., Disp: , Rfl:    rosuvastatin (CRESTOR) 5 MG tablet, TAKE 1 TABLET BY MOUTH EVERY MONDAY, WEDNESDAY, AND FRIDAY., Disp: 36 tablet, Rfl: 4  Current Facility-Administered Medications:    0.9 %  sodium chloride infusion, 500 mL, Intravenous, Continuous, Armbruster, Carlota Raspberry, MD   Medications ordered in this encounter:  No orders of the defined types were placed in this encounter.    *If you need refills on other medications prior to your next  appointment, please contact your pharmacy*  Follow-Up: Call back or seek an in-person evaluation if the symptoms worsen or if the condition fails to improve as anticipated.  [facility] [phone]  Other Instructions Please keep well-hydrated and get plenty of rest. Start a saline nasal rinse to flush out your nasal passages. You can use plain Mucinex to help thin congestion. If you have a humidifier, running in the bedroom at night. I want you to start OTC vitamin D3 1000 units daily, vitamin C 1000 mg daily, and a zinc supplement. Please take prescribed medications as directed. Hold the Crestor while on the Paxlovid and for an additional 5 days before restarting.  You have been enrolled in a MyChart symptom monitoring program. Please answer these questions daily so we can keep track of how you are doing.  You were to quarantine for 5 days from onset of your symptoms.  After day 5, if you have had no fever and you are feeling better, you can end quarantine but need to mask for an additional 5 days. After day 5 if you have a fever or are having significant symptoms, please quarantine for full 10 days.  If you note any worsening of symptoms, any significant shortness of breath or any chest pain, please seek ER evaluation ASAP.  Please do not delay care!  COVID-19: What to Do if You Are Sick If you test positive and are an older adult or someone who is at high risk of getting very sick  from COVID-19, treatment may be available. Contact a healthcare provider right away after a positive test to determine if you are eligible, even if your symptoms are mild right now. You can also visit a Test to Treat location and, if eligible, receive a prescription from a provider. Charles't delay: Treatment must be started within the first few days to be effective. If you have a fever, cough, or other symptoms, you might have COVID-19. Most people have mild illness and are able to recover at  home. If you are sick: Keep track of your symptoms. If you have an emergency warning sign (including trouble breathing), call 911. Steps to help prevent the spread of COVID-19 if you are sick If you are sick with COVID-19 or think you might have COVID-19, follow the steps below to care for yourself and to help protect other people in your home and community. Stay home except to get medical care Stay home. Most people with COVID-19 have mild illness and can recover at home without medical care. Do not leave your home, except to get medical care. Do not visit public areas and do not go to places where you are unable to wear a mask. Take care of yourself. Get rest and stay hydrated. Take over-the-counter medicines, such as acetaminophen, to help you feel better. Stay in touch with your doctor. Call before you get medical care. Be sure to get care if you have trouble breathing, or have any other emergency warning signs, or if you think it is an emergency. Avoid public transportation, ride-sharing, or taxis if possible. Get tested If you have symptoms of COVID-19, get tested. While waiting for test results, stay away from others, including staying apart from those living in your household. Get tested as soon as possible after your symptoms start. Treatments may be available for people with COVID-19 who are at risk for becoming very sick. Charles't delay: Treatment must be started early to be effective--some treatments must begin within 5 days of your first symptoms. Contact your healthcare provider right away if your test result is positive to determine if you are eligible. Self-tests are one of several options for testing for the virus that causes COVID-19 and may be more convenient than laboratory-based tests and point-of-care tests. Ask your healthcare provider or your local health department if you need help interpreting your test results. You can visit your state, tribal, local, and territorial health  department's website to look for the latest local information on testing sites. Separate yourself from other people As much as possible, stay in a specific room and away from other people and pets in your home. If possible, you should use a separate bathroom. If you need to be around other people or animals in or outside of the home, wear a well-fitting mask. Tell your close contacts that they may have been exposed to COVID-19. An infected person can spread COVID-19 starting 48 hours (or 2 days) before the person has any symptoms or tests positive. By letting your close contacts know they may have been exposed to COVID-19, you are helping to protect everyone. See COVID-19 and Animals if you have questions about pets. If you are diagnosed with COVID-19, someone from the health department may call you. Answer the call to slow the spread. Monitor your symptoms Symptoms of COVID-19 include fever, cough, or other symptoms. Follow care instructions from your healthcare provider and local health department. Your local health authorities may give instructions on checking your symptoms and reporting information. When  to seek emergency medical attention Look for emergency warning signs* for COVID-19. If someone is showing any of these signs, seek emergency medical care immediately: Trouble breathing Persistent pain or pressure in the chest New confusion Inability to wake or stay awake Pale, gray, or blue-colored skin, lips, or nail beds, depending on skin tone *This list is not all possible symptoms. Please call your medical provider for any other symptoms that are severe or concerning to you. Call 911 or call ahead to your local emergency facility: Notify the operator that you are seeking care for someone who has or may have COVID-19. Call ahead before visiting your doctor Call ahead. Many medical visits for routine care are being postponed or done by phone or telemedicine. If you have a medical  appointment that cannot be postponed, call your doctor's office, and tell them you have or may have COVID-19. This will help the office protect themselves and other patients. If you are sick, wear a well-fitting mask You should wear a mask if you must be around other people or animals, including pets (even at home). Wear a mask with the best fit, protection, and comfort for you. You Charles't need to wear the mask if you are alone. If you can't put on a mask (because of trouble breathing, for example), cover your coughs and sneezes in some other way. Try to stay at least 6 feet away from other people. This will help protect the people around you. Masks should not be placed on young children under age 17 years, anyone who has trouble breathing, or anyone who is not able to remove the mask without help. Cover your coughs and sneezes Cover your mouth and nose with a tissue when you cough or sneeze. Throw away used tissues in a lined trash can. Immediately wash your hands with soap and water for at least 20 seconds. If soap and water are not available, clean your hands with an alcohol-based hand sanitizer that contains at least 60% alcohol. Clean your hands often Wash your hands often with soap and water for at least 20 seconds. This is especially important after blowing your nose, coughing, or sneezing; going to the bathroom; and before eating or preparing food. Use hand sanitizer if soap and water are not available. Use an alcohol-based hand sanitizer with at least 60% alcohol, covering all surfaces of your hands and rubbing them together until they feel dry. Soap and water are the best option, especially if hands are visibly dirty. Avoid touching your eyes, nose, and mouth with unwashed hands. Handwashing Tips Avoid sharing personal household items Do not share dishes, drinking glasses, cups, eating utensils, towels, or bedding with other people in your home. Wash these items thoroughly after using them  with soap and water or put in the dishwasher. Clean surfaces in your home regularly Clean and disinfect high-touch surfaces (for example, doorknobs, tables, handles, light switches, and countertops) in your "sick room" and bathroom. In shared spaces, you should clean and disinfect surfaces and items after each use by the person who is ill. If you are sick and cannot clean, a caregiver or other person should only clean and disinfect the area around you (such as your bedroom and bathroom) on an as needed basis. Your caregiver/other person should wait as long as possible (at least several hours) and wear a mask before entering, cleaning, and disinfecting shared spaces that you use. Clean and disinfect areas that may have blood, stool, or body fluids on them. Use household  cleaners and disinfectants. Clean visible dirty surfaces with household cleaners containing soap or detergent. Then, use a household disinfectant. Use a product from [employer] List N: Disinfectants for Coronavirus (BLTJQ-30). Be sure to follow the instructions on the label to ensure safe and effective use of the product. Many products recommend keeping the surface wet with a disinfectant for a certain period of time (look at "contact time" on the product label). You may also need to wear personal protective equipment, such as gloves, depending on the directions on the product label. Immediately after disinfecting, wash your hands with soap and water for 20 seconds. For completed guidance on cleaning and disinfecting your home, visit Complete Disinfection Guidance. Take steps to improve ventilation at home Improve ventilation (air flow) at home to help prevent from spreading COVID-19 to other people in your household. Clear out COVID-19 virus particles in the air by opening windows, using air filters, and turning on fans in your home. Use this interactive tool to learn how to improve air flow in your home. When you can be around others after  being sick with COVID-19 Deciding when you can be around others is different for different situations. Find out when you can safely end home isolation. For any additional questions about your care, contact your healthcare provider or state or local health department. 09/12/2020 Content source: [employer] for Immunization and Respiratory Diseases (NC[facility]), Division of Viral Diseases This information is not intended to replace advice given to you by your health care provider. Make sure you discuss any questions you have with your health care provider. Document Revised: 10/26/2020 Document Reviewed: 10/26/2020 Elsevier Patient Education  2022 [employer].   If you have been instructed to have an in-person evaluation today at a local Urgent Care facility, please use the link below. It will take you to a list of all of our available [facility], including address, phone number and hours of operation. Please do not delay care.  [facility]  If you or a family member do not have a primary care provider, use the link below to schedule a visit and establish care. When you choose a [facility] primary care physician or advanced practice provider, you gain a long-term partner in health. Find a Primary Care Provider  Learn more about 's in-office and virtual care options: [facility]

## 2022-06-12 NOTE — Progress Notes (Signed)
  Thank you for the details you included in the comment boxes. Those details are very helpful in determining the best course of treatment for you and help [place] to provide the best care.Because of your medical history and higher risk of COVID complication, we recommend that you convert this visit to a video visit in order for the provider to better assess what is going on and in order to discuss antiviral medications which we cannot prescribe via e-visit.  The provider will be able to give you a more accurate diagnosis and treatment plan if we can more freely discuss your symptoms and with the addition of a virtual examination.   If you convert to a video visit, we will bill your insurance (similar to an office visit) and you will not be charged for this e-Visit. You will be able to stay at home and speak with the first available [facility] advanced practice provider. The link to do a video visit is in the drop down Menu tab of your Welcome screen in [place].

## 2022-06-12 NOTE — Progress Notes (Signed)
Virtual Visit Consent   Margaret Charles, you are scheduled for a virtual visit with a [place] provider today. Just as with appointments in the office, your consent must be obtained to participate. Your consent will be active for this visit and any virtual visit you may have with one of our providers in the next 365 days. If you have a MyChart account, a copy of this consent can be sent to you electronically.  As this is a virtual visit, video technology does not allow for your provider to perform a traditional examination. This may limit your provider's ability to fully assess your condition. If your provider identifies any concerns that need to be evaluated in person or the need to arrange testing (such as labs, EKG, etc.), we will make arrangements to do so. Although advances in technology are sophisticated, we cannot ensure that it will always work on either your end or our end. If the connection with a video visit is poor, the visit may have to be switched to a telephone visit. With either a video or telephone visit, we are not always able to ensure that we have a secure connection.  By engaging in this virtual visit, you consent to the provision of healthcare and authorize for your insurance to be billed (if applicable) for the services provided during this visit. Depending on your insurance coverage, you may receive a charge related to this service.  I need to obtain your verbal consent now. Are you willing to proceed with your visit today? Margaret Charles has provided verbal consent on 06/12/2022 for a virtual visit (video or telephone). Margaret Charles, [place]  Date: 06/12/2022 12:17 PM  Virtual Visit via Video Note   I, Margaret Charles, connected with  Margaret Charles  ([id number], 29-Oct-1950) on 06/12/22 at 12:00 PM EST by a video-enabled telemedicine application and verified that I am speaking with the correct person using two identifiers.  Location: Patient: Virtual Visit Location  Patient: Home Provider: Virtual Visit Location Provider: Home Office   I discussed the limitations of evaluation and management by telemedicine and the availability of in person appointments. The patient expressed understanding and agreed to proceed.    History of Present Illness: Margaret Charles is a 71 y.o. who identifies as a female who was assigned female at birth, and is being seen today for COVID + home test. Notes symptoms starting overnight with nasal congestion, pnd and sore throat. Some mild cough. Denies fever, chills, chest pain or SOB. No noted GI symptoms thus far. 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 To her knowledge, has not had COVID before. Is taking Coricidin HBP OTC. Has history of lung cancer in remission (next FU scan 11/2022), AAA, HTN, Emphysema (noted to be largely asymptomatic)  GFR 87.21 on labs with PCP on 11/2  HPI: HPI  Problems:  Patient Active Problem List   Diagnosis Date Noted   Aortic atherosclerosis ([place]) 04/02/2021   History of adenomatous polyp of colon 05/11/2019   Hypothyroidism 04/30/2019   Ascending aortic aneurysm ([place]) 08/27/2018   Benign paroxysmal positional vertigo of right ear 05/25/2018   Hypertension 12/29/2017   History of atrial fibrillation 12/29/2017   History of skin cancer 12/29/2017   Alcoholism in remission ([place]) 12/29/2017   Chronic headaches    Emphysema of lung ([place])    Left thyroid nodule 10/07/2017   S/P lobectomy of lung 09/16/2017   Lens replaced by other means 08/12/2017   Tear film  insufficiency 08/12/2017   Vitreous degeneration 08/12/2017   Cervicogenic headache 08/11/2017   DDD (degenerative disc disease), cervical 08/11/2017   History of lung cancer- right lower lobe s/p right lobectomy 08/06/2017   Lesion of right lung 08/06/2017   Arthritis of facet joint of cervical spine 07/28/2017   Palpitations 03/25/2017   RBBB 07/11/2015   Senile nuclear sclerosis 12/25/2011    Myopia 12/10/2011    Allergies:  Allergies  Allergen Reactions   Codeine Nausea And Vomiting   Quinolones     Patient was warned about not using Cipro and similar antibiotics. Recent studies have raised concern that fluoroquinolone antibiotics could be associated with an increased risk of aortic aneurysm Fluoroquinolones have non-antimicrobial properties that might jeopardise the integrity of the extracellular matrix of the vascular wall In a  propensity score matched cohort study in [place], there was a 66% increased rate of aortic aneurysm or dissection associated with oral fluoroquinolone use, compared wit   Medications:  Current Outpatient Medications:    benzonatate (TESSALON) 100 MG capsule, Take 1 capsule (100 mg total) by mouth 3 (three) times daily as needed for cough., Disp: 30 capsule, Rfl: 0   nirmatrelvir/ritonavir (PAXLOVID) 20 x 150 MG & 10 x 100MG  TABS, Take 3 tablets by mouth 2 (two) times daily for 5 days. (Take nirmatrelvir 150 mg two tablets twice daily for 5 days and ritonavir 100 mg one tablet twice daily for 5 days) Patient GFR is 87, Disp: 30 tablet, Rfl: 0   acetaminophen (TYLENOL) 325 MG tablet, Take 650 mg by mouth every 6 (six) hours as needed for mild pain., Disp: , Rfl:    fluticasone (FLONASE) 50 MCG/ACT nasal spray, Place 1 spray into both nostrils daily., Disp: , Rfl:    levothyroxine (SYNTHROID) 75 MCG tablet, Take 1 tablet (75 mcg total) by mouth daily., Disp: 90 tablet, Rfl: 3   metoprolol tartrate (LOPRESSOR) 25 MG tablet, Take 1 tablet (25 mg total) by mouth 2 (two) times daily., Disp: 180 tablet, Rfl: 3   Multiple Vitamins-Minerals (CENTRUM ADULTS PO), Take 1 tablet by mouth daily., Disp: , Rfl:    Propylene Glycol-Glycerin (SOOTHE OP), Place 1 drop into both eyes 2 (two) times daily as needed (dry eyes)., Disp: , Rfl:    rosuvastatin (CRESTOR) 5 MG tablet, TAKE 1 TABLET BY MOUTH EVERY MONDAY, WEDNESDAY, AND FRIDAY., Disp: 36 tablet, Rfl: 4  Current  Facility-Administered Medications:    0.9 %  sodium chloride infusion, 500 mL, Intravenous, Continuous, Armbruster, Carlota Raspberry, MD  Observations/Objective: Patient is well-developed, well-nourished in no acute distress.  Resting comfortably at home.  Head is normocephalic, atraumatic.  No labored breathing. Speech is clear and coherent with logical content.  Patient is alert and oriented at baseline.   Assessment and Plan: 1. COVID-19 - benzonatate (TESSALON) 100 MG capsule; Take 1 capsule (100 mg total) by mouth 3 (three) times daily as needed for cough.  Dispense: 30 capsule; Refill: 0 - nirmatrelvir/ritonavir (PAXLOVID) 20 x 150 MG & 10 x 100MG  TABS; Take 3 tablets by mouth 2 (two) times daily for 5 days. (Take nirmatrelvir 150 mg two tablets twice daily for 5 days and ritonavir 100 mg one tablet twice daily for 5 days) Patient GFR is 87  Dispense: 30 tablet; Refill: 0  Patient with multiple risk factors for complicated course of illness. Discussed risks/benefits of antiviral medications including most common potential ADRs. Patient voiced understanding and would like to proceed with antiviral medication. They are candidate for Paxlovid and  Molnupiravir. After discussion she elects to take the Paxlovid but hold her statin while on medication and for 5 days after (she only takes three times weekly and no history of MI/CVA). Rx sent to pharmacy. Supportive measures, OTC medications and vitamin regimen reviewed. Tessalon per orders. Patient has been enrolled in a MyChart COVID symptom monitoring program. Samule Dry reviewed in detail. Strict ER precautions discussed with patient.    Follow Up Instructions: I discussed the assessment and treatment plan with the patient. The patient was provided an opportunity to ask questions and all were answered. The patient agreed with the plan and demonstrated an understanding of the instructions.  A copy of instructions were sent to the patient via MyChart  unless otherwise noted below.   The patient was advised to call back or seek an in-person evaluation if the symptoms worsen or if the condition fails to improve as anticipated.  Time:  I spent 10 minutes with the patient via telehealth technology discussing the above problems/concerns.    Margaret Rio, PA-C

## 2022-06-14 ENCOUNTER — Telehealth: Payer: Self-pay

## 2022-06-14 NOTE — Telephone Encounter (Signed)
Called patient, no answer. Called in regard to [place] completed in my chart for diarrhea symptoms.

## 2022-06-25 ENCOUNTER — Telehealth: Payer: Medicare PPO | Admitting: Nurse Practitioner

## 2022-06-25 ENCOUNTER — Telehealth: Payer: Medicare PPO | Admitting: Physician Assistant

## 2022-06-25 DIAGNOSIS — U071 COVID-19: Secondary | ICD-10-CM

## 2022-06-25 DIAGNOSIS — Z8616 Personal history of COVID-19: Secondary | ICD-10-CM

## 2022-06-25 NOTE — Progress Notes (Signed)
Virtual Visit Consent   Margaret Charles, you are scheduled for a virtual visit with a [facility] provider today. Just as with appointments in the office, your consent must be obtained to participate. Your consent will be active for this visit and any virtual visit you may have with one of our providers in the next 365 days. If you have a MyChart account, a copy of this consent can be sent to you electronically.  As this is a virtual visit, video technology does not allow for your provider to perform a traditional examination. This may limit your provider's ability to fully assess your condition. If your provider identifies any concerns that need to be evaluated in person or the need to arrange testing (such as labs, EKG, etc.), we will make arrangements to do so. Although advances in technology are sophisticated, we cannot ensure that it will always work on either your end or our end. If the connection with a video visit is poor, the visit may have to be switched to a telephone visit. With either a video or telephone visit, we are not always able to ensure that we have a secure connection.  By engaging in this virtual visit, you consent to the provision of healthcare and authorize for your insurance to be billed (if applicable) for the services provided during this visit. Depending on your insurance coverage, you may receive a charge related to this service.  I need to obtain your verbal consent now. Are you willing to proceed with your visit today? Margaret Charles has provided verbal consent on 06/25/2022 for a virtual visit (video or telephone). Apolonio Schneiders, FNP  Date: 06/25/2022 5:13 PM  Virtual Visit via Video Note   I, Apolonio Schneiders, connected with  Wiley Flicker  ([id number], 11/02/1950) on 06/25/22 at  5:15 PM EST by a video-enabled telemedicine application and verified that I am speaking with the correct person using two identifiers.  Location: Patient: Virtual Visit Location Patient:  Home Provider: Virtual Visit Location Provider: Home Office   I discussed the limitations of evaluation and management by telemedicine and the availability of in person appointments. The patient expressed understanding and agreed to proceed.    History of Present Illness: Margaret Charles is a 72 y.o. who identifies as a female who was assigned female at birth, and is being seen today for COVID positive testing. She had symptoms on 06/11/22 tested positive on 06/12/22 and started Paxlovid that day. Her symptoms resolved quickly without lower respiratory involvement    She has been feeling well until this morning she started to have a sore throat, headache and runny nose.  She took another COVID test today and was positive   She did not test again during illness/has not had a negative test  Denies fever  Prior to this she has not had COVID in the past  She has been vaccinated including the most recent booster 03/23/22  History of lung CA pulmonary emphysema and intercostal neuralgia  Does not currently use any inhalers   Problems:  Patient Active Problem List   Diagnosis Date Noted   Aortic atherosclerosis ([place]) 04/02/2021   History of adenomatous polyp of colon 05/11/2019   Hypothyroidism 04/30/2019   Ascending aortic aneurysm ([place]) 08/27/2018   Benign paroxysmal positional vertigo of right ear 05/25/2018   Hypertension 12/29/2017   History of atrial fibrillation 12/29/2017   History of skin cancer 12/29/2017   Alcoholism in remission ([place]) 12/29/2017   Chronic headaches    Emphysema of lung (  [place])    Left thyroid nodule 10/07/2017   S/P lobectomy of lung 09/16/2017   Lens replaced by other means 08/12/2017   Tear film insufficiency 08/12/2017   Vitreous degeneration 08/12/2017   Cervicogenic headache 08/11/2017   DDD (degenerative disc disease), cervical 08/11/2017   History of lung cancer- right lower lobe s/p right lobectomy 08/06/2017   Lesion of right lung 08/06/2017    Arthritis of facet joint of cervical spine 07/28/2017   Palpitations 03/25/2017   RBBB 07/11/2015   Senile nuclear sclerosis 12/25/2011   Myopia 12/10/2011    Allergies:  Allergies  Allergen Reactions   Codeine Nausea And Vomiting   Quinolones     Patient was warned about not using Cipro and similar antibiotics. Recent studies have raised concern that fluoroquinolone antibiotics could be associated with an increased risk of aortic aneurysm Fluoroquinolones have non-antimicrobial properties that might jeopardise the integrity of the extracellular matrix of the vascular wall In a  propensity score matched cohort study in [place], there was a 66% increased rate of aortic aneurysm or dissection associated with oral fluoroquinolone use, compared wit   Medications:  Current Outpatient Medications:    acetaminophen (TYLENOL) 325 MG tablet, Take 650 mg by mouth every 6 (six) hours as needed for mild pain., Disp: , Rfl:    benzonatate (TESSALON) 100 MG capsule, Take 1 capsule (100 mg total) by mouth 3 (three) times daily as needed for cough., Disp: 30 capsule, Rfl: 0   fluticasone (FLONASE) 50 MCG/ACT nasal spray, Place 1 spray into both nostrils daily., Disp: , Rfl:    levothyroxine (SYNTHROID) 75 MCG tablet, Take 1 tablet (75 mcg total) by mouth daily., Disp: 90 tablet, Rfl: 3   metoprolol tartrate (LOPRESSOR) 25 MG tablet, Take 1 tablet (25 mg total) by mouth 2 (two) times daily., Disp: 180 tablet, Rfl: 3   Multiple Vitamins-Minerals (CENTRUM ADULTS PO), Take 1 tablet by mouth daily., Disp: , Rfl:    Propylene Glycol-Glycerin (SOOTHE OP), Place 1 drop into both eyes 2 (two) times daily as needed (dry eyes)., Disp: , Rfl:    rosuvastatin (CRESTOR) 5 MG tablet, TAKE 1 TABLET BY MOUTH EVERY MONDAY, WEDNESDAY, AND FRIDAY., Disp: 36 tablet, Rfl: 4  Current Facility-Administered Medications:    0.9 %  sodium chloride infusion, 500 mL, Intravenous, Continuous, Armbruster, Carlota Raspberry,  MD  Observations/Objective: Patient is well-developed, well-nourished in no acute distress.  Resting comfortably  at home.  Head is normocephalic, atraumatic.  No labored breathing.  Speech is clear and coherent with logical content.  Patient is alert and oriented at baseline.    Assessment and Plan: 1. COVID-19 May use benzonatate as needed for cough if cough onset and becomes productive or with onset of fever follow up as discussed  Continue to immune support with vitamins  Rest, push fluids and assure protein intake      Follow Up Instructions: I discussed the assessment and treatment plan with the patient. The patient was provided an opportunity to ask questions and all were answered. The patient agreed with the plan and demonstrated an understanding of the instructions.  A copy of instructions were sent to the patient via MyChart unless otherwise noted below.    The patient was advised to call back or seek an in-person evaluation if the symptoms worsen or if the condition fails to improve as anticipated.  Time:  I spent 20 minutes with the patient via telehealth technology discussing the above problems/concerns.    Apolonio Schneiders, FNP

## 2022-06-25 NOTE — Progress Notes (Signed)
   Thank you for the details you included in the comment boxes. Those details are very helpful in determining the best course of treatment for you and help [place] to provide the best care.Because we need to gather more information and have a detailed discussion, we recommend that you convert this visit to a video visit in order for the provider to better assess what is going on.  The provider will be able to give you a more accurate diagnosis and treatment plan if we can more freely discuss your symptoms and with the addition of a virtual examination.   If you convert to a video visit, we will bill your insurance (similar to an office visit) and you will not be charged for this e-Visit. You will be able to stay at home and speak with the first available [facility] advanced practice provider. The link to do a video visit is in the drop down Menu tab of your Welcome screen in [place].

## 2022-07-08 DIAGNOSIS — H35371 Puckering of macula, right eye: Secondary | ICD-10-CM | POA: Diagnosis not present

## 2022-07-08 DIAGNOSIS — H26493 Other secondary cataract, bilateral: Secondary | ICD-10-CM | POA: Diagnosis not present

## 2022-07-08 DIAGNOSIS — H04123 Dry eye syndrome of bilateral lacrimal glands: Secondary | ICD-10-CM | POA: Diagnosis not present

## 2022-07-22 ENCOUNTER — Encounter: Payer: Self-pay | Admitting: Cardiovascular Disease

## 2022-07-22 NOTE — Progress Notes (Unsigned)
Cardiology Office Note:    Date:  07/23/2022   ID:  Margaret Charles, DOB August 24, 1950, MRN [id number]  PCP:  [name], MD  Cardiologist:  Tavio Biegel   Referring MD: [name], MD   Problem list 1. Paroxysmal atrial fib - found after partial pneumonectomy 2.  Lung nodule 3.  Right bundle branch block   Chief Complaint  Patient presents with   Hyperlipidemia   Hypertension            Margaret Charles is a 72 y.o. female with a hx of 1.4 cm lung nodule.  She has a history of right bundle branch block and palpitations.  We are asked to see her for preoperative clearance prior to lung surgery.  She has a RBBB - which she has known about for the past 10 years . She has seen Margaret Charles in [place]. ( [facility])  Echo at that time showed normal LV function  Was told that she was stable  Developed palpitations a year or so later.   30 day monitr did not show any issues  She had a low dose lung CT scan for screening ( since she was a smoker )  Was found to have a 1.4 cm mass.    deines any chest pain or dyspnea.   Walks 3 miles a day a a good pace .  Able to climb 3 flights of stairs without any problems  Does water aerobics.  BP has been elevated . She eats lots of salt - popcorn every day .  Hx of obesity in her 23s .  Has lost 90 lbs   December 08, 2017  Margaret Charles is seen back today after her Rt lower lobe lobectomy ( for stage 1 squamous cell carcinoma)  She apparently developed atrial fibrillation while in the hospital.  Her blood pressure was also not well controlled.  She was started on amiodarone 200 mg twice a day.  Which was gradually titrated down.  She is now on 200 mg a day and metoprolol   June 10, 2018: Here for follow up  Had PAF after a lobectomy   June 10, 2019: Margaret Charles is seen today for follow-up of her hypertension and paroxysmal atrial fibrillation.  She also has a history of hypothyroidism and is on Synthroid.  Has hx of lung cancer.    Has  also been found to have an aneurism of her ascending aorta.  Echo showed normal LV function .   Could not differentiate whether or not the  Aortic valve was 2 or 3 leaflet. The valve functions as a 3 leaflet valve so it is likely a normal 3 leaflet valve.    Jan. 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   Has very brief episodes of palpitations Her apple watch has recorded a HR of 135 for perhaps 30 mintues  Encouraged her to record an ECG on her watch   She walks regularly ,   walked 5 miles this am   Jul 23, 2022 Margaret Charles is seen for follow up of her HTN, PAF, COPD, ascending aortic aneurism 4 cm.   She gets yearly CT scans and has been followed by [facility]  (  [place] )   Has not had any PAF  Walked 5 miles a day  Still working   Had her 1st episode of COVID this past December .  Took Paxlovid, had a rebound episoe of COVID        Past Medical History:  Diagnosis Date   Allergy    SEASONAL   Arthritis    ddd   Cataract    BILATERAL,REMOVED   Chronic headaches    imprved recently per patient    Emphysema of lung ([place])    asymptomatic. noted on CT   Family history of colon cancer    GERD (gastroesophageal reflux disease)    History of adenomatous polyp of colon    2015 precancerous polyp history- due spring 2020.  5 years   History of seizures    1980s few ??? seizure activity  (vagal response)LAST ONE IN THE 1980'S NONE SINCE UPDATED 04/18/22   Hyperlipidemia    Hypertension    Lung cancer ([place]) dx'd 07/2017   Neuromuscular disorder ([place])    NERVE DAMAGE   Palpitations    RBBB (right bundle branch block)    palpitations   Seizures ([facility])    Skin cancer    skin basal cell cancer   Substance abuse ([place])     ETOH,ABUSE SOBER FOR 40 YEARS UPDATED 04/18/22   Thyroid disease    HYPO    Past Surgical History:  Procedure Laterality Date   BLADDER SURGERY  1985   Long term incontinence. [facility]- used small intestine to build bladder   BREAST BIOPSY  2001   CATARACT EXTRACTION W/ INTRAOCULAR LENS  IMPLANT, BILATERAL     Intestinal blockage  1989 and 1990   bowel obstruction surgery   Lung cancer removal   08/2017   Right lower lobe   VIDEO ASSISTED THORACOSCOPY (VATS)/WEDGE RESECTION Right 09/16/2017   Procedure: VIDEO ASSISTED THORACOSCOPY (VATS)/LUNG RESECTION;  Surgeon: [name], MD;  Location: [facility];  Service: Thoracic;  Laterality: Right;   VIDEO BRONCHOSCOPY N/A 09/16/2017   Procedure: VIDEO BRONCHOSCOPY;  Surgeon: [name], MD;  Location: [facility];  Service: Thoracic;  Laterality: N/A;   WRIST SURGERY     de quervains tenosynovitis. Dr. Maureen Ralphs actually did this.     Current Medications: Current Meds  Medication Sig   acetaminophen (TYLENOL) 325 MG tablet Take 650 mg by mouth every 6 (six) hours as needed for mild pain.   fluticasone (FLONASE) 50 MCG/ACT nasal spray Place 1 spray into both nostrils daily.   levothyroxine (SYNTHROID) 75 MCG tablet Take 1 tablet (75 mcg total) by mouth daily.   metoprolol tartrate (LOPRESSOR) 25 MG tablet Take 1 tablet (25 mg total) by mouth 2 (two) times daily.   Multiple Vitamins-Minerals (CENTRUM ADULTS PO) Take 1 tablet by mouth daily.   Propylene Glycol-Glycerin (SOOTHE OP) Place 1 drop into both eyes 2 (two) times daily as needed (dry eyes).   rosuvastatin (CRESTOR) 5 MG tablet TAKE 1 TABLET BY MOUTH EVERY MONDAY, WEDNESDAY, AND FRIDAY.   Current Facility-Administered Medications for the 07/23/22 encounter (Office Visit) with Alysiana Ethridge, Wonda Cheng, MD  Medication   0.9 %  sodium chloride infusion     Allergies:   Codeine and Quinolones   Social History   Socioeconomic History   Marital status: Single    Spouse name: Not on file    Number of children: Not on file   Years of education: Not on file   Highest education level: Not on  file  Occupational History   Occupation: Retired Pharmacist, hospital  Tobacco Use   Smoking status: Former    Packs/day: 1.00    Years: 40.00    Total pack years: 40.00    Types: Cigarettes    Quit date: 12/03/2008    Years since quitting: 13.6    Passive exposure: Past   Smokeless tobacco: Never  Vaping Use   Vaping Use: Never used  Substance and Sexual Activity   Alcohol use: Not Currently    Comment: hx  etoh  38 yrs   Drug use: Never   Sexual activity: Not Currently  Other Topics Concern   Not on file  Social History Narrative   Single. Lives alone.    Lived in [place] for some years to help with parents but otherwise in [place] most of life.       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 [employer]   [employer]      [place] for [employer], a lot of time with friends- book clubs, walking, reading, some gardening   Social Determinants of Health   Financial Resource Strain: Low Risk  (03/18/2022)   Overall Financial Resource Strain (CARDIA)    Difficulty of Paying Living Expenses: Not hard at all  Food Insecurity: No Food Insecurity (03/18/2022)   Hunger Vital Sign    Worried About Running Out of Food in the Last Year: Never true    [place] in the Last Year: Never true  Transportation Needs: No Transportation Needs (03/18/2022)   PRAPARE - Hydrologist (Medical): No    Lack of Transportation (Non-Medical): No  Physical Activity: Sufficiently Active (03/18/2022)   Exercise Vital Sign    Days of Exercise per Week: 5 days    Minutes of Exercise per Session: 60 min  Stress: No Stress Concern Present (03/18/2022)   [place]    Feeling of Stress : Not at all  Social Connections: Moderately Integrated (03/18/2022)    Social Connection and Isolation Panel [NHANES]    Frequency of Communication with Friends and Family: More than three times a week    Frequency of Social Gatherings with Friends and Family: More than three times a week    Attends Religious Services: More than 4 times per year    Active Member of [employer] or Organizations: Yes    Attends Archivist Meetings: 1 to 4 times per year    Marital Status: Never married     Family History: The patient's family history includes Aortic aneurysm in her mother and sister; Atrial fibrillation in her sister; Breast cancer in her sister; Colon cancer in an other family member; Colon polyps in her father and another family member; Dementia in her mother; Esophageal cancer in her paternal aunt; Heart attack in her mother, paternal grandfather, and sister; Heart failure in her sister; Hypertension in her father, mother, sister, and sister; Prostate cancer in her father. There is no history of Crohn's disease, Rectal cancer, Stomach cancer, or Ulcerative colitis.  ROS:   Please see the history of present illness.     All other systems reviewed and are negative.  EKGs/Labs/Other Studies Reviewed:    The following studies were reviewed today:      Recent Labs: 01/30/2022: TSH 1.52 04/25/2022: ALT 18; BUN 16; Creatinine, Ser 0.67; Hemoglobin 12.6; Platelets 279.0; Potassium 4.7; Sodium 138  Recent Lipid Panel    Component Value Date/Time   CHOL 144 04/25/2022 0847   CHOL 147 10/02/2020 0913   TRIG 73.0 04/25/2022 0847   HDL 69.20 04/25/2022 0847   HDL 66 10/02/2020 0913   CHOLHDL 2 04/25/2022 0847   VLDL 14.6 04/25/2022 0847   LDLCALC 60 04/25/2022 0847   LDLCALC 67 10/02/2020 0913   LDLCALC 100 (H) 03/30/2020 0920   Physical Exam: Blood pressure 126/80, Charles 67, height 5\' 7"  (1.702 m), weight 141 lb (64 kg), SpO2 94 %.       GEN:  Well nourished, well developed in no acute distress HEENT: Normal NECK: No JVD; No carotid  bruits LYMPHATICS: No lymphadenopathy CARDIAC: RRR , no murmurs, rubs, gallops RESPIRATORY:  Clear to auscultation without rales, wheezing or rhonchi  ABDOMEN: Soft, non-tender, non-distended MUSCULOSKELETAL:  No edema; No deformity  SKIN: Warm and dry NEUROLOGIC:  Alert and oriented x 3   EKG:    July 23, 2022: Normal sinus rhythm at 67.  Right bundle branch block.  No changes from previous EKG.  .   ASSESSMENT:  Plan      1.  Paroxysmal atrial fibrillation: She had 1 single episode of paroxysmal atrial fibrillation following lung surgery.  She has not had any recurrent atrial fibrillation.  She is not on anticoagulation.  Will continue to follow her yearly .    2.  Essential hypertension:   Blood pressure is well-controlled.  3.  Dilated ascending aorta: She has a 4 cm fusiform aortic aneurysm.  This is followed by Dr. Roxan Hockey.    Medication Adjustments/Labs and Tests Ordered: Current medicines are reviewed at length with the patient today.  Concerns regarding medicines are outlined above.  Orders Placed This Encounter  Procedures   EKG 12-Lead   No orders of the defined types were placed in this encounter.   Signed, Mertie Moores, MD  07/23/2022 4:59 PM    [facility]

## 2022-07-23 ENCOUNTER — Encounter: Payer: Self-pay | Admitting: Cardiovascular Disease

## 2022-07-23 ENCOUNTER — Ambulatory Visit: Payer: Medicare PPO | Attending: Cardiovascular Disease | Admitting: Cardiovascular Disease

## 2022-07-23 VITALS — BP 126/80 | HR 67 | Ht 67.0 in | Wt 141.0 lb

## 2022-07-23 DIAGNOSIS — I480 Paroxysmal atrial fibrillation: Secondary | ICD-10-CM

## 2022-07-23 DIAGNOSIS — I10 Essential (primary) hypertension: Secondary | ICD-10-CM | POA: Diagnosis not present

## 2022-07-23 NOTE — Patient Instructions (Signed)
Medication Instructions:  Your physician recommends that you continue on your current medications as directed. Please refer to the Current Medication list given to you today.  *If you need a refill on your cardiac medications before your next appointment, please call your pharmacy*   Lab Work: None  If you have labs (blood work) drawn today and your tests are completely normal, you will receive your results only by: [place] (if you have MyChart) OR A paper copy in the mail If you have any lab test that is abnormal or we need to change your treatment, we will call you to review the results.   Testing/Procedures: NONE   Follow-Up: At [facility], you and your health needs are our priority.  As part of our continuing mission to provide you with exceptional heart care, we have created designated Provider Care Teams.  These Care Teams include your primary Cardiologist (physician) and Advanced Practice Providers (APPs -  Physician Assistants and Nurse Practitioners) who all work together to provide you with the care you need, when you need it.  We recommend signing up for the patient portal called "MyChart".  Sign up information is provided on this After Visit Summary.  MyChart is used to connect with patients for Virtual Visits (Telemedicine).  Patients are able to view lab/test results, encounter notes, upcoming appointments, etc.  Non-urgent messages can be sent to your provider as well.   To learn more about what you can do with MyChart, go to [email or web address].    Your next appointment:   1 year(s)  Provider:   Mertie Moores, MD

## 2022-08-05 DIAGNOSIS — M7731 Calcaneal spur, right foot: Secondary | ICD-10-CM | POA: Diagnosis not present

## 2022-08-05 DIAGNOSIS — M792 Neuralgia and neuritis, unspecified: Secondary | ICD-10-CM | POA: Diagnosis not present

## 2022-08-05 DIAGNOSIS — M21961 Unspecified acquired deformity of right lower leg: Secondary | ICD-10-CM | POA: Diagnosis not present

## 2022-08-05 DIAGNOSIS — M722 Plantar fascial fibromatosis: Secondary | ICD-10-CM | POA: Diagnosis not present

## 2022-08-05 DIAGNOSIS — M7732 Calcaneal spur, left foot: Secondary | ICD-10-CM | POA: Diagnosis not present

## 2022-08-05 DIAGNOSIS — M21962 Unspecified acquired deformity of left lower leg: Secondary | ICD-10-CM | POA: Diagnosis not present

## 2022-08-17 ENCOUNTER — Other Ambulatory Visit: Payer: Self-pay | Admitting: Cardiovascular Disease

## 2022-08-17 DIAGNOSIS — E785 Hyperlipidemia, unspecified: Secondary | ICD-10-CM

## 2022-08-17 DIAGNOSIS — I10 Essential (primary) hypertension: Secondary | ICD-10-CM

## 2022-08-17 DIAGNOSIS — I480 Paroxysmal atrial fibrillation: Secondary | ICD-10-CM

## 2022-08-20 DIAGNOSIS — M722 Plantar fascial fibromatosis: Secondary | ICD-10-CM | POA: Diagnosis not present

## 2022-09-09 ENCOUNTER — Telehealth: Payer: Self-pay | Admitting: Cardiovascular Disease

## 2022-09-09 NOTE — Telephone Encounter (Signed)
Patient c/o Palpitations:  High priority if patient c/o lightheadedness, shortness of breath, or chest pain  How long have you had palpitations/irregular HR/ Afib? Are you having the symptoms now? Just one night   Are you currently experiencing lightheadedness, SOB or CP? No   Do you have a history of afib (atrial fibrillation) or irregular heart rhythm?   Have you checked your BP or HR? (document readings if available): did not record   Are you experiencing any other symptoms?  Pt states it happened after a long flight and it showed on her kardia mobile while she was sleep.

## 2022-09-10 ENCOUNTER — Encounter: Payer: Self-pay | Admitting: Cardiovascular Disease

## 2022-09-10 DIAGNOSIS — Z1231 Encounter for screening mammogram for malignant neoplasm of breast: Secondary | ICD-10-CM | POA: Diagnosis not present

## 2022-09-10 LAB — HM MAMMOGRAPHY

## 2022-09-12 NOTE — Telephone Encounter (Signed)
Duplicate encounter-see MyChart message from 09/10/22

## 2022-09-12 NOTE — Telephone Encounter (Signed)
Returned call to patient, explained that I was off work until today which is why she hadn't heard from [place] yet. Patient verified that her apple watch triggered her upon awakening that she had atrial fibrillation during her sleep while on a trip to [place]. She states that there is not an EKG reading, just time stamps with associated HR readings that are recorded over an hour and states A-fib beside them. She understands that the trigger for her was almost certainly her exhaustion/lack of sleep. Has not felt or had episode prior to this one, since "5 years ago after my surgery." She states that the following night from this event, she did experience a moment of palpitations/racing heart while watching television/resting, but states it stopped just as soon as it started. She does not take aspirin, states her PCP stopped it around 10 years ago due to GI issues. Verbalized that she has not felt bad, or had recurrence of any episodes since this one, including since her arrival home. Is currently doing her daily 5 mile walk and has been back to work. Advised that I will speak to Dr Acie Fredrickson tomorrow as the issue remains of does she need to be anticoagulated based on these episodes. She understands and appreciates call.

## 2022-09-16 DIAGNOSIS — M722 Plantar fascial fibromatosis: Secondary | ICD-10-CM | POA: Diagnosis not present

## 2022-09-16 DIAGNOSIS — M24571 Contracture, right ankle: Secondary | ICD-10-CM | POA: Diagnosis not present

## 2022-09-16 DIAGNOSIS — M25561 Pain in right knee: Secondary | ICD-10-CM | POA: Diagnosis not present

## 2022-09-16 DIAGNOSIS — M24572 Contracture, left ankle: Secondary | ICD-10-CM | POA: Diagnosis not present

## 2022-10-03 ENCOUNTER — Other Ambulatory Visit: Payer: Self-pay

## 2022-10-03 ENCOUNTER — Encounter: Payer: Self-pay | Admitting: Family Medicine

## 2022-10-03 DIAGNOSIS — R42 Dizziness and giddiness: Secondary | ICD-10-CM

## 2022-10-04 NOTE — Telephone Encounter (Signed)
Patient called to see where she was referred. Informed her that per Ardine Bjork that our referral coordinator, Misty Stanley, is working on this. Informed her that once we have the info we can give it to her to call and schedule. Pt states symptoms are very difficult to bear. Requests call from [place] @ [phone].

## 2022-10-05 ENCOUNTER — Other Ambulatory Visit: Payer: Self-pay

## 2022-10-05 ENCOUNTER — Emergency Department (HOSPITAL_BASED_OUTPATIENT_CLINIC_OR_DEPARTMENT_OTHER)
Admission: EM | Admit: 2022-10-05 | Discharge: 2022-10-05 | Disposition: A | Discharge status: Home / Self Care | Payer: Medicare PPO | Attending: Emergency Medicine | Admitting: Emergency Medicine

## 2022-10-05 ENCOUNTER — Encounter (HOSPITAL_BASED_OUTPATIENT_CLINIC_OR_DEPARTMENT_OTHER): Payer: Self-pay | Admitting: Emergency Medicine

## 2022-10-05 DIAGNOSIS — E039 Hypothyroidism, unspecified: Secondary | ICD-10-CM | POA: Insufficient documentation

## 2022-10-05 DIAGNOSIS — R42 Dizziness and giddiness: Secondary | ICD-10-CM | POA: Diagnosis not present

## 2022-10-05 DIAGNOSIS — Z79899 Other long term (current) drug therapy: Secondary | ICD-10-CM | POA: Insufficient documentation

## 2022-10-05 DIAGNOSIS — Z85118 Personal history of other malignant neoplasm of bronchus and lung: Secondary | ICD-10-CM | POA: Diagnosis not present

## 2022-10-05 DIAGNOSIS — I10 Essential (primary) hypertension: Secondary | ICD-10-CM | POA: Diagnosis not present

## 2022-10-05 HISTORY — DX: Unspecified atrial fibrillation: I48.91

## 2022-10-05 LAB — BASIC METABOLIC PANEL
Anion gap: 8 (ref 5–15)
BUN: 14 mg/dL (ref 8–23)
CO2: 26 mmol/L (ref 22–32)
Calcium: 9.5 mg/dL (ref 8.9–10.3)
Chloride: 103 mmol/L (ref 98–111)
Creatinine, Ser: 0.64 mg/dL (ref 0.44–1.00)
GFR, Estimated: >60 mL/min (ref >60)
Glucose, Bld: 99 mg/dL (ref 70–99)
Potassium: 4.2 mmol/L (ref 3.5–5.1)
Sodium: 137 mmol/L (ref 135–145)

## 2022-10-05 LAB — URINALYSIS, ROUTINE W REFLEX MICROSCOPIC
Bilirubin Urine: NEGATIVE
Glucose, UA: NEGATIVE mg/dL
Hgb urine dipstick: NEGATIVE
Ketones, ur: NEGATIVE mg/dL
Leukocytes,Ua: NEGATIVE
Nitrite: NEGATIVE
Protein, ur: NEGATIVE mg/dL
Specific Gravity, Urine: 1.016 (ref 1.005–1.030)
pH: 5.5 (ref 5.0–8.0)

## 2022-10-05 LAB — CBC
HCT: 39.8 % (ref 36.0–46.0)
Hemoglobin: 13.5 g/dL (ref 12.0–15.0)
MCH: 30.3 pg (ref 26.0–34.0)
MCHC: 33.9 g/dL (ref 30.0–36.0)
MCV: 89.4 fL (ref 80.0–100.0)
Platelets: 299 10*3/uL (ref 150–400)
RBC: 4.45 MIL/uL (ref 3.87–5.11)
RDW: 13 % (ref 11.5–15.5)
WBC: 10.8 10*3/uL — ABNORMAL HIGH (ref 4.0–10.5)
nRBC: 0 % (ref 0.0–0.2)

## 2022-10-05 LAB — MAGNESIUM: Magnesium: 1.9 mg/dL (ref 1.7–2.4)

## 2022-10-05 MED ORDER — MECLIZINE HCL 25 MG PO TABS
25.0000 mg | ORAL_TABLET | Freq: Once | ORAL | Status: AC
  Administered 2022-10-05: 25 mg via ORAL
  Filled 2022-10-05: qty 1

## 2022-10-05 NOTE — ED Triage Notes (Signed)
Pt has hx vertigo. Wednesday night started with vertigo, has not stopped. Today felt a little better but after sitting down she felt dizzy, room spinning, nausea (her symptoms of vertigo). Last time she was treated with PT (was about 5 years ago).

## 2022-10-05 NOTE — ED Notes (Signed)
Patient ambulatory approximately 50 yards with steady gait. Patient does report intermittent dizziness. Provider notified.

## 2022-10-05 NOTE — ED Provider Notes (Signed)
[place] EMERGENCY DEPARTMENT AT [facility] Provider Note   CSN: [id number] Arrival date & time: 10/05/22  1257     History  No chief complaint on file.   Margaret Charles is a 72 y.o. female with h/o RLL lung cancer s/p R lobectomy, hypothyroidism, HTN, h/o afib, BPPV, ascending aortic aneurysm, chronic headache, who presents with vertigo.   Wednesday night started with vertigo, woke up Thursday morning and had continued vertigo a/w headache. Feels the same as prior episodes of vertigo. Today felt a little better, HA had resolved, but after sitting down she felt dizzy, room spinning, nausea. Called her PCP on Thursday to request referral to PT where she was treated successfully 5 years ago. Today at 1 PM was seated when had acute onset of symptoms again, which is abnormal, because normally symptoms occur when she is up moving around. Hasn't fallen or lost consciousness, no head trauma. Currently has no symptoms at this moment. Denies slurred speech, facial droop, numbness/tingling asymmetric weakness, f/c, ear pain, changes to hearing, trouble swallowing, CP, SOB, palpitations, abdominal pain, urinary symptoms, diarrhea/constipation. Tried taking dramamine for the first couple of days which didn't help.   HPI     Home Medications Prior to Admission medications   Medication Sig Start Date End Date Taking? Authorizing Provider  acetaminophen (TYLENOL) 325 MG tablet Take 650 mg by mouth every 6 (six) hours as needed for mild pain.    [provider]  benzonatate (TESSALON) 100 MG capsule Take 1 capsule (100 mg total) by mouth 3 (three) times daily as needed for cough. Patient not taking: Reported on 07/23/2022 06/12/22   Waldon Merl, PA-C  fluticasone [facility]) 50 MCG/ACT nasal spray Place 1 spray into both nostrils daily.    [provider]  levothyroxine (SYNTHROID) 75 MCG tablet Take 1 tablet (75 mcg total) by mouth daily. 02/01/22   Shelva Majestic, MD   metoprolol tartrate (LOPRESSOR) 25 MG tablet TAKE 1 TABLET BY MOUTH TWICE A DAY 08/19/22   Nahser, Deloris Ping, MD  Multiple Vitamins-Minerals (CENTRUM ADULTS PO) Take 1 tablet by mouth daily.    [provider]  Propylene Glycol-Glycerin (SOOTHE OP) Place 1 drop into both eyes 2 (two) times daily as needed (dry eyes).    [provider]  rosuvastatin (CRESTOR) 5 MG tablet TAKE 1 TABLET BY MOUTH EVERY MONDAY, WEDNESDAY, AND FRIDAY. 04/26/22   Shelva Majestic, MD      Allergies    Codeine and Quinolones    Review of Systems   Review of Systems Review of systems Negative for f/c.  A 10 point review of systems was performed and is negative unless otherwise reported in HPI.  Physical Exam Updated Vital Signs BP (!) 148/74 (BP Location: Right Arm)   Pulse 70   Temp 98.8 F (37.1 C) (Oral)   Resp 16   SpO2 98%  Physical Exam General: Normal appearing female, lying in bed.  HEENT: PERRLA, EOMI, no nystagmus, Sclera anicteric, MMM, trachea midline. Tongue protrudes midline.  Cardiology: RRR, no murmurs/rubs/gallops. BL radial and DP pulses equal bilaterally.  Resp: Normal respiratory rate and effort. CTAB, no wheezes, rhonchi, crackles.  Abd: Soft, non-tender, non-distended. No rebound tenderness or guarding.  GU: Deferred. MSK: No peripheral edema or signs of trauma. Extremities without deformity or TTP. No cyanosis or clubbing. Skin: warm, dry. No rashes or lesions. Back: No CVA tenderness Neuro: A&Ox4, CNs II-XII grossly intact. 5/5 strength in all four extremities. Sensation grossly intact. Normal  speech. Psych: Normal mood and affect.   HINTS exam: Head impulse test - patient has no symptoms at this time, no nystagmus on exam, so not applicable. Nystagmus - none Test of skew - neg   ED Results / Procedures / Treatments   Labs (all labs ordered are listed, but only abnormal results are displayed) Labs Reviewed  CBC - Abnormal; Notable for the following  components:      Result Value   WBC 10.8 (*)    All other components within normal limits  URINALYSIS, ROUTINE W REFLEX MICROSCOPIC - Abnormal; Notable for the following components:   APPearance HAZY (*)    Bacteria, UA RARE (*)    Non Squamous Epithelial 0-5 (*)    All other components within normal limits  BASIC METABOLIC PANEL  MAGNESIUM    EKG EKG Interpretation  Date/Time:  Saturday October 05 2022 13:13:15 EDT Ventricular Rate:  65 PR Interval:  116 QRS Duration: 122 QT Interval:  430 QTC Calculation: 447 R Axis:   119 Text Interpretation: Unusual P axis, possible ectopic atrial rhythm Right bundle branch block Abnormal ECG When compared with ECG of 20-Sep-2017 09:58, Ectopic atrial rhythm has replaced Atrial fibrillation QT has shortened Confirmed by Vivi Barrack [phone]) on 10/05/2022 4:25:51 PM  Radiology No results found.  Procedures Procedures    Medications Ordered in ED Medications  meclizine (ANTIVERT) tablet 25 mg (25 mg Oral Given 10/05/22 1602)    ED Course/ Medical Decision Making/ A&P                          Medical Decision Making Amount and/or Complexity of Data Reviewed Labs: ordered. Decision-making details documented in ED Course.    This patient presents to the ED for concern of vertigo, this involves an extensive number of treatment options, and is a complaint that carries with it a high risk of complications and morbidity.  I considered the following differential and admission for this acute, potentially life threatening condition.   MDM:    Patient presents with vertiginous symptoms similar to her prior history of peripheral vertigo. Consider BPPV, which patient has known history. Also considered meniere's syndrome, labyrinthitis, vestibular neuritis but no ear pain, hearing loss, tinnitus or recent illnesses, and patient's TMs are normal bilaterally. No h/o recent head trauma. Will obtain labs/urine to r/o electrolyte abnormalities,  hypoglycemia, UTI and give meclizine and reassess.  Considered central vertigo but patient's symptoms are intermittent, seems to be triggered by head movement/positional. Her HINTS exam -p patient doesn't have active nystagmus, not indicative of central cause. So less likely posterior stroke, brain tumor, vertebral artery dissection, basilar migraine, vertebrobasilar insufficiency. Other less likely causes include anemia, hyperviscosity syndrome, toxic (alcohol, aminoglycosides), metabolic such as thyroid disease or hypoglycemia.  Clinical Course as of 10/11/22 1544  Sat Oct 05, 2022  1707 Urinalysis, Routine w reflex microscopic -Urine, Clean Catch(!) No UTI [HN]  1707 Magnesium: 1.9 [HN]  1707 Basic metabolic panel wnl [HN]  1707 CBC(!) unremarkable [HN]    Clinical Course User Index [HN] Loetta Rough, MD    Labs: I Ordered, and personally interpreted labs.  The pertinent results include:  those listed above  Additional history obtained from chart review, friend Jan at bedside  Cardiac Monitoring: The patient was maintained on a cardiac monitor.  I personally viewed and interpreted the cardiac monitored which showed an underlying rhythm of: NSR  Reevaluation: After the interventions noted above, I reevaluated the  patient and found that they have :improved  Social Determinants of Health: Patient lives independently   Disposition:  Patient's labs unremarkable. Her symptoms mildly improved with meclizine. She is ambulated to the bathroom and seems mildly unsteady, however she states this is how she has been for the last several days. I offered patient additional workup including CTH and additional trials of medication for her symptoms such as valium however patient declined, stating this is like her BPPV and she just needs vestibular PT. I will place referral for vestibular PT however I made patient aware that her PCP may need to place this referral to make it effective. Patient  will call her PT and try to get an appointment. I discussed with patient and her friend extensive discharge instructions/return precautions. DC w/ PT and PCP f/u.     Co morbidities that complicate the patient evaluation  Past Medical History:  Diagnosis Date   A-fib    post op lung transfer, never have been on medication   Allergy    SEASONAL   Arthritis    ddd   Cataract    BILATERAL,REMOVED   Chronic headaches    imprved recently per patient    Emphysema of lung    asymptomatic. noted on CT   Family history of colon cancer    GERD (gastroesophageal reflux disease)    History of adenomatous polyp of colon    2015 precancerous polyp history- due spring 2020.  5 years   History of seizures    1980s few ??? seizure activity  (vagal response)LAST ONE IN THE 1980'S NONE SINCE UPDATED 04/18/22   Hyperlipidemia    Hypertension    Lung cancer dx'd 07/2017   Neuromuscular disorder    NERVE DAMAGE   Palpitations    RBBB (right bundle branch block)    palpitations   Seizures    Skin cancer    skin basal cell cancer   Substance abuse    ETOH,ABUSE SOBER FOR 40 YEARS UPDATED 04/18/22   Thyroid disease    HYPO     Medicines Meds ordered this encounter  Medications   meclizine (ANTIVERT) tablet 25 mg    I have reviewed the patients home medicines and have made adjustments as needed  Problem List / ED Course: Problem List Items Addressed This Visit   None Visit Diagnoses     Vertigo    -  Primary                   This note was created using dictation software, which may contain spelling or grammatical errors.    Loetta Rough, MD 10/11/22 [phone]

## 2022-10-05 NOTE — Discharge Instructions (Signed)
Thank you for coming to [facility] Emergency Department. You were seen for vertigo. We did an exam, labs, and imaging, and these showed no acute findings.  Please follow up with your primary care provider within 1 week.  I placed a referral for PT with vestibular rehab.  This referral however may need to be placed by your PCP. You can take meclizine 25 mg as needed up to every 6 hours for vertigo. Make sure to also stay well hydrated.  Do not hesitate to return to the ED or call 911 if you experience: -Worsening symptoms -Fall, head trauma -Numbness/tingling -Asymmetric weakness -Trouble speaking or slurring your words -Confusion -Lightheadedness, passing out -Fevers/chills -Anything else that concerns you

## 2022-10-07 ENCOUNTER — Other Ambulatory Visit: Payer: Self-pay

## 2022-10-07 DIAGNOSIS — R42 Dizziness and giddiness: Secondary | ICD-10-CM

## 2022-10-08 ENCOUNTER — Ambulatory Visit: Payer: Medicare PPO | Attending: Family Medicine

## 2022-10-08 DIAGNOSIS — R2681 Unsteadiness on feet: Secondary | ICD-10-CM | POA: Diagnosis not present

## 2022-10-08 DIAGNOSIS — R42 Dizziness and giddiness: Secondary | ICD-10-CM | POA: Diagnosis not present

## 2022-10-08 NOTE — Therapy (Addendum)
OUTPATIENT PHYSICAL THERAPY VESTIBULAR EVALUATION     Patient Name: Margaret Charles MRN: [id number] DOB:[birth year], 72 y.o., female Today's Date: 10/08/2022  END OF SESSION:  PT End of Session - 10/08/22 6387     Visit Number 1    Number of Visits 9    Date for PT Re-Evaluation 11/12/22    Authorization Type [employer]    PT Start Time 0930    PT Stop Time 1016    PT Time Calculation (min) 46 min    Activity Tolerance Patient tolerated treatment well    Behavior During Therapy WFL for tasks assessed/performed             Past Medical History:  Diagnosis Date   A-fib    post op lung transfer, never have been on medication   Allergy    SEASONAL   Arthritis    ddd   Cataract    BILATERAL,REMOVED   Chronic headaches    imprved recently per patient    Emphysema of lung    asymptomatic. noted on CT   Family history of colon cancer    GERD (gastroesophageal reflux disease)    History of adenomatous polyp of colon    2015 precancerous polyp history- due spring 2020.  5 years   History of seizures    1980s few ??? seizure activity  (vagal response)LAST ONE IN THE 1980'S NONE SINCE UPDATED 04/18/22   Hyperlipidemia    Hypertension    Lung cancer dx'd 07/2017   Neuromuscular disorder    NERVE DAMAGE   Palpitations    RBBB (right bundle branch block)    palpitations   Seizures    Skin cancer    skin basal cell cancer   Substance abuse    ETOH,ABUSE SOBER FOR 40 YEARS UPDATED 04/18/22   Thyroid disease    HYPO   Past Surgical History:  Procedure Laterality Date   BLADDER SURGERY  1985   Long term incontinence. [facility]- used small intestine to build bladder   BREAST BIOPSY  2001   CATARACT EXTRACTION W/ INTRAOCULAR LENS  IMPLANT, BILATERAL     Intestinal blockage  1989 and 1990   bowel obstruction surgery   Lung cancer removal   08/2017   Right lower lobe   VIDEO ASSISTED THORACOSCOPY (VATS)/WEDGE RESECTION Right 09/16/2017   Procedure: VIDEO ASSISTED  THORACOSCOPY (VATS)/LUNG RESECTION;  Surgeon: Delight Ovens, MD;  Location: [facility] OR;  Service: Thoracic;  Laterality: Right;   VIDEO BRONCHOSCOPY N/A 09/16/2017   Procedure: VIDEO BRONCHOSCOPY;  Surgeon: Delight Ovens, MD;  Location: [facility] OR;  Service: Thoracic;  Laterality: N/A;   WRIST SURGERY     de quervains tenosynovitis. Dr. Despina Hick actually did this.    Patient Active Problem List   Diagnosis Date Noted   Aortic atherosclerosis 04/02/2021   History of adenomatous polyp of colon 05/11/2019   Hypothyroidism 04/30/2019   Ascending aortic aneurysm 08/27/2018   Benign paroxysmal positional vertigo of right ear 05/25/2018   Hypertension 12/29/2017   History of atrial fibrillation 12/29/2017   History of skin cancer 12/29/2017   Alcoholism in remission 12/29/2017   Chronic headaches    Emphysema of lung    Left thyroid nodule 10/07/2017   S/P lobectomy of lung 09/16/2017   Lens replaced by other means 08/12/2017   Tear film insufficiency 08/12/2017   Vitreous degeneration 08/12/2017   Cervicogenic headache 08/11/2017   DDD (degenerative disc disease), cervical 08/11/2017   History of lung cancer- right lower  lobe s/p right lobectomy 08/06/2017   Lesion of right lung 08/06/2017   Arthritis of facet joint of cervical spine 07/28/2017   Palpitations 03/25/2017   RBBB 07/11/2015   Senile nuclear sclerosis 12/25/2011   Myopia 12/10/2011    PCP: Shelva Majestic, MD REFERRING PROVIDER: Shelva Majestic, MD  REFERRING DIAG: R42 (ICD-10-CM) - Vertigo  THERAPY DIAG:  Dizziness and giddiness - Plan: PT plan of care cert/re-cert  Unsteadiness on feet - Plan: PT plan of care cert/re-cert  ONSET DATE: 10/07/2022 referral  Rationale for Evaluation and Treatment: Rehabilitation  SUBJECTIVE:   SUBJECTIVE STATEMENT: Patient arrives to clinic by herself ambulating with no AD. Reports doing much better than this past weekend when she went to the ER for her symptoms. Started  Thursday AM when she went to get out of bed for work. Does follow an oncologist related to h/o lung CA. Has not had any known mets. Did take meclizine this AM, doesn't feel as though it helps a whole lot.  Pt accompanied by: self  PERTINENT HISTORY: h/o RLL lung cancer s/p R lobectomy, hypothyroidism, HTN, h/o afib, BPPV, ascending aortic aneurysm, chronic headache, who presents with vertigo  PAIN:  Are you having pain? No  PRECAUTIONS: Fall  WEIGHT BEARING RESTRICTIONS: No  FALLS: Has patient fallen in last 6 months? No  LIVING ENVIRONMENT: Lives with: lives alone Lives in: House/apartment Stairs: No Has following equipment at home: None  PLOF: Independent; driving, working   PATIENT GOALS: " to not be dizzy"  OBJECTIVE:   DIAGNOSTIC FINDINGS: CT chest ordered, no head imaging ordered  COGNITION: Overall cognitive status: Within functional limits for tasks assessed   SENSATION: WFL  POSTURE:  No Significant postural limitations  Cervical ROM:   WFL  STRENGTH: WFL  BED MOBILITY:  Denies difficulty, intermittent dizziness  PATIENT SURVEYS:  FOTO 42, expected to be at 12  VESTIBULAR ASSESSMENT:  GENERAL OBSERVATION: NAD   SYMPTOM BEHAVIOR:  Subjective history: see above  Non-Vestibular symptoms: nausea/vomiting  Type of dizziness: Spinning/Vertigo  Frequency: daily  Duration: seconds  Aggravating factors: Spontaneous, Induced by position change: rolling to the left and sit to stand, and Induced by motion: bending down to the ground  Relieving factors: head stationary, medication, and rest  Progression of symptoms: better  OCULOMOTOR EXAM:  Ocular Alignment: normal  Ocular ROM: No Limitations  Spontaneous Nystagmus: absent  Gaze-Induced Nystagmus: absent  Smooth Pursuits: intact  Saccades: intact  Convergence/Divergence: <5 cm   VESTIBULAR - OCULAR REFLEX:   Slow VOR: Normal  VOR Cancellation: Comment: normal, reports of increased  dizziness  Head-Impulse Test: HIT Right: negative HIT Left: negative   POSITIONAL TESTING: Right Dix-Hallpike: no nystagmus Left Dix-Hallpike: upbeating, left nystagmus Right Roll Test: no nystagmus Left Roll Test: no nystagmus and reports 2/5 dizziness  MOTION SENSITIVITY:  Motion Sensitivity Quotient Intensity: 0 = none, 1 = Lightheaded, 2 = Mild, 3 = Moderate, 4 = Severe, 5 = Vomiting  Intensity  1. Sitting to supine 2  2. Supine to L side 2  3. Supine to R side 0  4. Supine to sitting 2  5. L Hallpike-Dix 2  6. Up from L  2  7. R Hallpike-Dix 0  8. Up from R  0  9. Sitting, head tipped to L knee   10. Head up from L knee   11. Sitting, head tipped to R knee   12. Head up from R knee   13. Sitting head turns x5  14.Sitting head nods x5   15. In stance, 180 turn to L    16. In stance, 180 turn to R      VESTIBULAR TREATMENT:                                                                                                   Canalith Repositioning:  Epley Left: Number of Reps: 1 and Response to Treatment: symptoms resolved   PATIENT EDUCATION: Education details: PT POC, exam findings, self epley info  Person educated: Patient Education method: Explanation and Handouts Education comprehension: verbalized understanding and needs further education  HOME EXERCISE PROGRAM:  GOALS: Goals reviewed with patient? Yes  SHORT TERM GOALS: = LTG based on PT POC  LONG TERM GOALS: Target date: 11/12/22  Pt will be independent with final HEP for improved symptom report  Baseline: to be provided  Goal status: INITIAL  2.  Patient will demonstrate (-) positional testing to indicate resolution of BPPV  Baseline: (+) L posterior canal canalithiasis  Goal status: INITIAL  3.  Patient will improve FOTO score to >/= 58 to demonstrate improvement/ resolution of symptoms Baseline: 42 Goal status: INITIAL  ASSESSMENT:  CLINICAL IMPRESSION: Patient is a 72 y.o. female who  was seen today for physical therapy evaluation and treatment for dizziness. She has a distant h/o BPPV. No central signs observed on eval. Patient with positive L dix hallpike with L upbeating torsional nystagmus with immediate onset, fatiguing in <8s indicative of L posterior canal canalithiasis. Treated with Epley maneuver x1. Upon re-check, patient with no nystagmus and only motion sensitivity moving in/out of dix hallpike position reported. She would benefit from skilled PT services to address the above mentioned deficits.   OBJECTIVE IMPAIRMENTS: dizziness.   ACTIVITY LIMITATIONS: bending, sleeping, bed mobility, and locomotion level  PARTICIPATION LIMITATIONS: laundry, driving, shopping, community activity, occupation, and yard work  PERSONAL FACTORS: Age, Past/current experiences, Sex, and 3+ comorbidities: see above  are also affecting patient's functional outcome.   REHAB POTENTIAL: Good  CLINICAL DECISION MAKING: Stable/uncomplicated  EVALUATION COMPLEXITY: Low   PLAN:  PT FREQUENCY: 2x/week  PT DURATION: 4 weeks  PLANNED INTERVENTIONS: Therapeutic exercises, Therapeutic activity, Neuromuscular re-education, Balance training, Gait training, Patient/Family education, Self Care, Joint mobilization, Stair training, Vestibular training, Canalith repositioning, Visual/preceptual remediation/compensation, DME instructions, Aquatic Therapy, Manual therapy, and Re-evaluation  PLAN FOR NEXT SESSION: re-check L dix hallpike and tx PRN   Westley Foots, PT, DPT, CBIS 10/08/2022, 11:17 AM

## 2022-10-09 ENCOUNTER — Ambulatory Visit: Payer: Medicare PPO

## 2022-10-09 ENCOUNTER — Telehealth: Payer: Self-pay

## 2022-10-09 DIAGNOSIS — R42 Dizziness and giddiness: Secondary | ICD-10-CM

## 2022-10-09 DIAGNOSIS — R2681 Unsteadiness on feet: Secondary | ICD-10-CM

## 2022-10-09 NOTE — Telephone Encounter (Signed)
     Patient  visit on 10/05/2022  at [facility] was for vertigo.  Have you been able to follow up with your primary care physician?  Patient has appointment with physical therapy.  The patient was or was not able to obtain any needed medicine or equipment. Patient was able to obtain medication.  Are there diet recommendations that you are having difficulty following? No  Patient expresses understanding of discharge instructions and education provided has no other needs at this time. Yes   Elleen Coulibaly [facility]  [facility] Population Health Community Resource Care Guide   [email or web address]  ?? [phone]   [email or web address]: [email or web address]  [email or web address]

## 2022-10-09 NOTE — Therapy (Signed)
OUTPATIENT PHYSICAL THERAPY VESTIBULAR TREATMENT/ RE-CERT     Patient Name: Margaret Charles MRN: [id number] DOB:1950/07/23, 72 y.o., female Today's Date: 10/09/2022  END OF SESSION:  PT End of Session - 10/09/22 0936     Visit Number 2    Number of Visits 16   re-cert to allow for more visits each week   Date for PT Re-Evaluation 11/12/22    Authorization Type [employer]    PT Start Time [phone]    PT Stop Time 1014    PT Time Calculation (min) 40 min    Activity Tolerance Patient tolerated treatment well    Behavior During Therapy WFL for tasks assessed/performed             Past Medical History:  Diagnosis Date   A-fib    post op lung transfer, never have been on medication   Allergy    SEASONAL   Arthritis    ddd   Cataract    BILATERAL,REMOVED   Chronic headaches    imprved recently per patient    Emphysema of lung    asymptomatic. noted on CT   Family history of colon cancer    GERD (gastroesophageal reflux disease)    History of adenomatous polyp of colon    2015 precancerous polyp history- due spring 2020.  5 years   History of seizures    1980s few ??? seizure activity  (vagal response)LAST ONE IN THE 1980'S NONE SINCE UPDATED 04/18/22   Hyperlipidemia    Hypertension    Lung cancer dx'd 07/2017   Neuromuscular disorder    NERVE DAMAGE   Palpitations    RBBB (right bundle branch block)    palpitations   Seizures    Skin cancer    skin basal cell cancer   Substance abuse    ETOH,ABUSE SOBER FOR 40 YEARS UPDATED 04/18/22   Thyroid disease    HYPO   Past Surgical History:  Procedure Laterality Date   BLADDER SURGERY  1985   Long term incontinence. [facility]- used small intestine to build bladder   BREAST BIOPSY  2001   CATARACT EXTRACTION W/ INTRAOCULAR LENS  IMPLANT, BILATERAL     Intestinal blockage  1989 and 1990   bowel obstruction surgery   Lung cancer removal   08/2017   Right lower lobe   VIDEO ASSISTED THORACOSCOPY (VATS)/WEDGE  RESECTION Right 09/16/2017   Procedure: VIDEO ASSISTED THORACOSCOPY (VATS)/LUNG RESECTION;  Surgeon: Delight Ovens, MD;  Location: [facility] OR;  Service: Thoracic;  Laterality: Right;   VIDEO BRONCHOSCOPY N/A 09/16/2017   Procedure: VIDEO BRONCHOSCOPY;  Surgeon: Delight Ovens, MD;  Location: [facility] OR;  Service: Thoracic;  Laterality: N/A;   WRIST SURGERY     de quervains tenosynovitis. Dr. Despina Hick actually did this.    Patient Active Problem List   Diagnosis Date Noted   Aortic atherosclerosis 04/02/2021   History of adenomatous polyp of colon 05/11/2019   Hypothyroidism 04/30/2019   Ascending aortic aneurysm 08/27/2018   Benign paroxysmal positional vertigo of right ear 05/25/2018   Hypertension 12/29/2017   History of atrial fibrillation 12/29/2017   History of skin cancer 12/29/2017   Alcoholism in remission 12/29/2017   Chronic headaches    Emphysema of lung    Left thyroid nodule 10/07/2017   S/P lobectomy of lung 09/16/2017   Lens replaced by other means 08/12/2017   Tear film insufficiency 08/12/2017   Vitreous degeneration 08/12/2017   Cervicogenic headache 08/11/2017   DDD (degenerative disc disease),  cervical 08/11/2017   History of lung cancer- right lower lobe s/p right lobectomy 08/06/2017   Lesion of right lung 08/06/2017   Arthritis of facet joint of cervical spine 07/28/2017   Palpitations 03/25/2017   RBBB 07/11/2015   Senile nuclear sclerosis 12/25/2011   Myopia 12/10/2011    PCP: Shelva Majestic, MD REFERRING PROVIDER: Shelva Majestic, MD  REFERRING DIAG: R42 (ICD-10-CM) - Vertigo  THERAPY DIAG:  Dizziness and giddiness - Plan: PT plan of care cert/re-cert  Unsteadiness on feet - Plan: PT plan of care cert/re-cert  ONSET DATE: 10/07/2022 referral  Rationale for Evaluation and Treatment: Rehabilitation  SUBJECTIVE:   SUBJECTIVE STATEMENT: Patient reports doing fair. Was dizzy and had multiple episodes of emesis after yesterdays session. Denies  falls/near falls.  Pt accompanied by: self  PERTINENT HISTORY: h/o RLL lung cancer s/p R lobectomy, hypothyroidism, HTN, h/o afib, BPPV, ascending aortic aneurysm, chronic headache, who presents with vertigo  PAIN:  Are you having pain? No  PRECAUTIONS: Fall  PATIENT GOALS: " to not be dizzy"  VESTIBULAR ASSESSMENT:  POSITIONAL TESTING: Left Dix-Hallpike: no nystagmus Left Roll Test: no nystagmus  MOTION SENSITIVITY:  Motion Sensitivity Quotient Intensity: 0 = none, 1 = Lightheaded, 2 = Mild, 3 = Moderate, 4 = Severe, 5 = Vomiting  Intensity  1. Sitting to supine 2  2. Supine to L side 2  3. Supine to R side 0  4. Supine to sitting 2  5. L Hallpike-Dix 2  6. Up from L  2  7. R Hallpike-Dix 0  8. Up from R  0  9. Sitting, head tipped to L knee   10. Head up from L knee   11. Sitting, head tipped to R knee   12. Head up from R knee   13. Sitting head turns x5   14.Sitting head nods x5   15. In stance, 180 turn to L    16. In stance, 180 turn to R      VESTIBULAR TREATMENT:                                                                                                  Austin Miles R x3 (symptoms remaining constant 3/5 dizziness)   L x3 (symptoms improving from 3/5-1/5)  Rx2 (no symptoms)  L x2 (no symptoms)    PATIENT EDUCATION: Education details: PT POC, exam findings, brandt daroff exercises Person educated: Patient Education method: Explanation and Handouts Education comprehension: verbalized understanding and needs further education  HOME EXERCISE PROGRAM:  GOALS: Goals reviewed with patient? Yes  SHORT TERM GOALS: = LTG based on PT POC  LONG TERM GOALS: Target date: 11/12/22  Pt will be independent with final HEP for improved symptom report  Baseline: to be provided  Goal status: INITIAL  2.  Patient will demonstrate (-) positional testing to indicate resolution of BPPV  Baseline: (+) L posterior canal canalithiasis  Goal status: INITIAL  3.   Patient will improve FOTO score to >/= 58 to demonstrate improvement/ resolution of symptoms Baseline: 42 Goal status: INITIAL  ASSESSMENT:  CLINICAL  IMPRESSION: Patient seen for skilled PT session with emphasis on re-assessing L dix hallpike and habituation exercises. Without nystagmus today on testing, even with bed in trendelenburg position. Initially symptomatic with brandt daroff exercises (no nystagmus noted), but eventually resolved with repeated reps. Discussed PT POC and adding visits/ week to allow for more intensive treatment. Continue POC.   OBJECTIVE IMPAIRMENTS: dizziness.   ACTIVITY LIMITATIONS: bending, sleeping, bed mobility, and locomotion level  PARTICIPATION LIMITATIONS: laundry, driving, shopping, community activity, occupation, and yard work  PERSONAL FACTORS: Age, Past/current experiences, Sex, and 3+ comorbidities: see above  are also affecting patient's functional outcome.   REHAB POTENTIAL: Good  CLINICAL DECISION MAKING: Stable/uncomplicated  EVALUATION COMPLEXITY: Low   PLAN:  PT FREQUENCY: 4x/week re-cert to allow for more visits each week  PT DURATION: 4 weeks  PLANNED INTERVENTIONS: Therapeutic exercises, Therapeutic activity, Neuromuscular re-education, Balance training, Gait training, Patient/Family education, Self Care, Joint mobilization, Stair training, Vestibular training, Canalith repositioning, Visual/preceptual remediation/compensation, DME instructions, Aquatic Therapy, Manual therapy, and Re-evaluation  PLAN FOR NEXT SESSION: re-check L dix hallpike and tx PRN   Westley Foots, PT, DPT, CBIS 10/09/2022, 11:02 AM

## 2022-10-10 ENCOUNTER — Ambulatory Visit: Payer: Medicare PPO

## 2022-10-10 DIAGNOSIS — R42 Dizziness and giddiness: Secondary | ICD-10-CM

## 2022-10-10 DIAGNOSIS — R2681 Unsteadiness on feet: Secondary | ICD-10-CM

## 2022-10-10 NOTE — Therapy (Signed)
OUTPATIENT PHYSICAL THERAPY VESTIBULAR TREATMENT/ ARRIVED NO CHARGE     Patient Name: Margaret Charles MRN: [id number] DOB:30-Aug-1950, 72 y.o., female Today's Date: 10/10/2022  END OF SESSION:  PT End of Session - 10/10/22 1441     Visit Number 2    Number of Visits 16    Date for PT Re-Evaluation 11/12/22    Authorization Type [employer]    PT Start Time 1450    PT Stop Time 1502   arrived no charge, [facility] check   PT Time Calculation (min) 12 min    Activity Tolerance Patient tolerated treatment well    Behavior During Therapy WFL for tasks assessed/performed             Past Medical History:  Diagnosis Date   A-fib    post op lung transfer, never have been on medication   Allergy    SEASONAL   Arthritis    ddd   Cataract    BILATERAL,REMOVED   Chronic headaches    imprved recently per patient    Emphysema of lung    asymptomatic. noted on CT   Family history of colon cancer    GERD (gastroesophageal reflux disease)    History of adenomatous polyp of colon    2015 precancerous polyp history- due spring 2020.  5 years   History of seizures    1980s few ??? seizure activity  (vagal response)LAST ONE IN THE 1980'S NONE SINCE UPDATED 04/18/22   Hyperlipidemia    Hypertension    Lung cancer dx'd 07/2017   Neuromuscular disorder    NERVE DAMAGE   Palpitations    RBBB (right bundle branch block)    palpitations   Seizures    Skin cancer    skin basal cell cancer   Substance abuse    ETOH,ABUSE SOBER FOR 40 YEARS UPDATED 04/18/22   Thyroid disease    HYPO   Past Surgical History:  Procedure Laterality Date   BLADDER SURGERY  1985   Long term incontinence. [facility]- used small intestine to build bladder   BREAST BIOPSY  2001   CATARACT EXTRACTION W/ INTRAOCULAR LENS  IMPLANT, BILATERAL     Intestinal blockage  1989 and 1990   bowel obstruction surgery   Lung cancer removal   08/2017   Right lower lobe   VIDEO ASSISTED THORACOSCOPY (VATS)/WEDGE RESECTION  Right 09/16/2017   Procedure: VIDEO ASSISTED THORACOSCOPY (VATS)/LUNG RESECTION;  Surgeon: Delight Ovens, MD;  Location: [facility] OR;  Service: Thoracic;  Laterality: Right;   VIDEO BRONCHOSCOPY N/A 09/16/2017   Procedure: VIDEO BRONCHOSCOPY;  Surgeon: Delight Ovens, MD;  Location: [facility] OR;  Service: Thoracic;  Laterality: N/A;   WRIST SURGERY     de quervains tenosynovitis. Dr. Despina Hick actually did this.    Patient Active Problem List   Diagnosis Date Noted   Aortic atherosclerosis 04/02/2021   History of adenomatous polyp of colon 05/11/2019   Hypothyroidism 04/30/2019   Ascending aortic aneurysm 08/27/2018   Benign paroxysmal positional vertigo of right ear 05/25/2018   Hypertension 12/29/2017   History of atrial fibrillation 12/29/2017   History of skin cancer 12/29/2017   Alcoholism in remission 12/29/2017   Chronic headaches    Emphysema of lung    Left thyroid nodule 10/07/2017   S/P lobectomy of lung 09/16/2017   Lens replaced by other means 08/12/2017   Tear film insufficiency 08/12/2017   Vitreous degeneration 08/12/2017   Cervicogenic headache 08/11/2017   DDD (degenerative disc disease), cervical  08/11/2017   History of lung cancer- right lower lobe s/p right lobectomy 08/06/2017   Lesion of right lung 08/06/2017   Arthritis of facet joint of cervical spine 07/28/2017   Palpitations 03/25/2017   RBBB 07/11/2015   Senile nuclear sclerosis 12/25/2011   Myopia 12/10/2011    PCP: Shelva Majestic, MD REFERRING PROVIDER: Shelva Majestic, MD  REFERRING DIAG: R42 (ICD-10-CM) - Vertigo  THERAPY DIAG:  Dizziness and giddiness  Unsteadiness on feet  ONSET DATE: 10/07/2022 referral  Rationale for Evaluation and Treatment: Rehabilitation  SUBJECTIVE:   SUBJECTIVE STATEMENT: Patient reports doing excellent. Has not had any dizziness since yesterday. No N/V after last tx. Went to work and no exacerbation of symptoms. Denies falls/near falls.  Pt accompanied by:  self  PERTINENT HISTORY: h/o RLL lung cancer s/p R lobectomy, hypothyroidism, HTN, h/o afib, BPPV, ascending aortic aneurysm, chronic headache, who presents with vertigo  PAIN:  Are you having pain? No  PRECAUTIONS: Fall  PATIENT GOALS: " to not be dizzy"  VESTIBULAR ASSESSMENT:  POSITIONAL TESTING: Left Dix-Hallpike: no nystagmus Left Roll Test: no nystagmus    VESTIBULAR TREATMENT:                                                                                                  Arrived no charge   PATIENT EDUCATION: Education details: PT POC, exam findings, brandt daroff exercises Person educated: Patient Education method: Explanation and Handouts Education comprehension: verbalized understanding and needs further education  HOME EXERCISE PROGRAM:  GOALS: Goals reviewed with patient? Yes  SHORT TERM GOALS: = LTG based on PT POC  LONG TERM GOALS: Target date: 11/12/22  Pt will be independent with final HEP for improved symptom report  Baseline: to be provided  Goal status: INITIAL  2.  Patient will demonstrate (-) positional testing to indicate resolution of BPPV  Baseline: (+) L posterior canal canalithiasis  Goal status: INITIAL  3.  Patient will improve FOTO score to >/= 58 to demonstrate improvement/ resolution of symptoms Baseline: 42 Goal status: INITIAL  ASSESSMENT:  CLINICAL IMPRESSION: PT re-checking patients L posterior canal canalithiasis per patient request. Clear of nystagmus, no reports of dizziness.   OBJECTIVE IMPAIRMENTS: dizziness.   ACTIVITY LIMITATIONS: bending, sleeping, bed mobility, and locomotion level  PARTICIPATION LIMITATIONS: laundry, driving, shopping, community activity, occupation, and yard work  PERSONAL FACTORS: Age, Past/current experiences, Sex, and 3+ comorbidities: see above  are also affecting patient's functional outcome.   REHAB POTENTIAL: Good  CLINICAL DECISION MAKING: Stable/uncomplicated  EVALUATION  COMPLEXITY: Low   PLAN:  PT FREQUENCY: 4x/week re-cert to allow for more visits each week  PT DURATION: 4 weeks  PLANNED INTERVENTIONS: Therapeutic exercises, Therapeutic activity, Neuromuscular re-education, Balance training, Gait training, Patient/Family education, Self Care, Joint mobilization, Stair training, Vestibular training, Canalith repositioning, Visual/preceptual remediation/compensation, DME instructions, Aquatic Therapy, Manual therapy, and Re-evaluation  PLAN FOR NEXT SESSION: re-check L dix hallpike and tx PRN   Westley Foots, PT, DPT, CBIS 10/10/2022, 3:08 PM

## 2022-10-14 DIAGNOSIS — M722 Plantar fascial fibromatosis: Secondary | ICD-10-CM | POA: Diagnosis not present

## 2022-10-15 ENCOUNTER — Ambulatory Visit: Payer: Medicare PPO | Admitting: Physical Therapy

## 2022-10-17 ENCOUNTER — Ambulatory Visit: Payer: Medicare PPO

## 2022-10-22 ENCOUNTER — Ambulatory Visit: Payer: Medicare PPO | Admitting: Physical Therapy

## 2022-10-24 ENCOUNTER — Ambulatory Visit: Payer: Medicare PPO

## 2022-10-29 ENCOUNTER — Ambulatory Visit: Payer: Medicare PPO | Admitting: Physical Therapy

## 2022-10-31 ENCOUNTER — Ambulatory Visit: Payer: Medicare PPO

## 2022-11-05 ENCOUNTER — Ambulatory Visit: Payer: Medicare PPO | Admitting: Physical Therapy

## 2022-11-11 DIAGNOSIS — M24571 Contracture, right ankle: Secondary | ICD-10-CM | POA: Diagnosis not present

## 2022-11-11 DIAGNOSIS — M24572 Contracture, left ankle: Secondary | ICD-10-CM | POA: Diagnosis not present

## 2022-11-11 DIAGNOSIS — M722 Plantar fascial fibromatosis: Secondary | ICD-10-CM | POA: Diagnosis not present

## 2022-11-13 NOTE — Therapy (Unsigned)
[facility] [address] [place], [place], [address] Phone: [phone]   Fax:  [phone]  Patient Details  Name: Margaret Charles MRN: [id number] Date of Birth: 02-10-1951 Referring Provider:  No ref. provider found  Encounter Date: 11/13/2022  PHYSICAL THERAPY DISCHARGE SUMMARY  Visits from Start of Care: 2  Current functional level related to goals / functional outcomes: Resolution of symptoms    Remaining deficits: none   Education / Equipment: PT POC, exam findings, BPPV   Patient agrees to discharge. Patient goals were met. Patient is being discharged due to being pleased with the current functional level.  Westley Foots, PT, DPT, CBIS 11/13/2022, 9:50 AM  [facility] [address] [place], [place], [address] Phone: [phone]   Fax:  [phone]

## 2022-12-03 ENCOUNTER — Other Ambulatory Visit: Payer: Self-pay

## 2022-12-03 ENCOUNTER — Ambulatory Visit (HOSPITAL_COMMUNITY)
Admission: RE | Admit: 2022-12-03 | Discharge: 2022-12-03 | Disposition: A | Discharge status: Home / Self Care | Payer: Medicare PPO | Source: Ambulatory Visit | Attending: Physician Assistant | Admitting: Physician Assistant

## 2022-12-03 ENCOUNTER — Inpatient Hospital Stay: Payer: Medicare PPO | Attending: Physician Assistant

## 2022-12-03 DIAGNOSIS — C3490 Malignant neoplasm of unspecified part of unspecified bronchus or lung: Secondary | ICD-10-CM | POA: Diagnosis not present

## 2022-12-03 DIAGNOSIS — I700 Atherosclerosis of aorta: Secondary | ICD-10-CM | POA: Insufficient documentation

## 2022-12-03 DIAGNOSIS — R911 Solitary pulmonary nodule: Secondary | ICD-10-CM | POA: Insufficient documentation

## 2022-12-03 DIAGNOSIS — Z79899 Other long term (current) drug therapy: Secondary | ICD-10-CM | POA: Insufficient documentation

## 2022-12-03 DIAGNOSIS — Z85118 Personal history of other malignant neoplasm of bronchus and lung: Secondary | ICD-10-CM | POA: Insufficient documentation

## 2022-12-03 LAB — CBC WITH DIFFERENTIAL (CANCER CENTER ONLY)
Abs Immature Granulocytes: 0.02 10*3/uL (ref 0.00–0.07)
Basophils Absolute: 0 10*3/uL (ref 0.0–0.1)
Basophils Relative: 0 %
Eosinophils Absolute: 0.2 10*3/uL (ref 0.0–0.5)
Eosinophils Relative: 2 %
HCT: 36.7 % (ref 36.0–46.0)
Hemoglobin: 12.6 g/dL (ref 12.0–15.0)
Immature Granulocytes: 0 %
Lymphocytes Relative: 27 %
Lymphs Abs: 2.6 10*3/uL (ref 0.7–4.0)
MCH: 30.9 pg (ref 26.0–34.0)
MCHC: 34.3 g/dL (ref 30.0–36.0)
MCV: 90 fL (ref 80.0–100.0)
Monocytes Absolute: 0.7 10*3/uL (ref 0.1–1.0)
Monocytes Relative: 7 %
Neutro Abs: 5.9 10*3/uL (ref 1.7–7.7)
Neutrophils Relative %: 64 %
Platelet Count: 281 10*3/uL (ref 150–400)
RBC: 4.08 MIL/uL (ref 3.87–5.11)
RDW: 12.7 % (ref 11.5–15.5)
WBC Count: 9.4 10*3/uL (ref 4.0–10.5)
nRBC: 0 % (ref 0.0–0.2)

## 2022-12-03 LAB — CMP (CANCER CENTER ONLY)
ALT: 16 U/L (ref 0–44)
AST: 19 U/L (ref 15–41)
Albumin: 4.1 g/dL (ref 3.5–5.0)
Alkaline Phosphatase: 94 U/L (ref 38–126)
Anion gap: 4 — ABNORMAL LOW (ref 5–15)
BUN: 12 mg/dL (ref 8–23)
CO2: 28 mmol/L (ref 22–32)
Calcium: 9.5 mg/dL (ref 8.9–10.3)
Chloride: 105 mmol/L (ref 98–111)
Creatinine: 0.81 mg/dL (ref 0.44–1.00)
GFR, Estimated: >60 mL/min (ref >60)
Glucose, Bld: 105 mg/dL — ABNORMAL HIGH (ref 70–99)
Potassium: 5 mmol/L (ref 3.5–5.1)
Sodium: 137 mmol/L (ref 135–145)
Total Bilirubin: 0.6 mg/dL (ref 0.3–1.2)
Total Protein: 7.1 g/dL (ref 6.5–8.1)

## 2022-12-03 MED ORDER — IOHEXOL 300 MG/ML  SOLN
80.0000 mL | Freq: Once | INTRAMUSCULAR | Status: AC | PRN
  Administered 2022-12-03: 80 mL via INTRAVENOUS

## 2022-12-03 MED ORDER — SODIUM CHLORIDE (PF) 0.9 % IJ SOLN
INTRAMUSCULAR | Status: AC
  Filled 2022-12-03: qty 50

## 2022-12-05 ENCOUNTER — Inpatient Hospital Stay: Payer: Medicare PPO | Admitting: Internal Medicine

## 2022-12-05 ENCOUNTER — Other Ambulatory Visit: Payer: Self-pay

## 2022-12-05 ENCOUNTER — Ambulatory Visit: Payer: Medicare PPO | Admitting: Internal Medicine

## 2022-12-05 VITALS — BP 121/87 | HR 67 | Temp 97.7°F | Resp 18 | Wt 143.8 lb

## 2022-12-05 DIAGNOSIS — Z85118 Personal history of other malignant neoplasm of bronchus and lung: Secondary | ICD-10-CM | POA: Diagnosis not present

## 2022-12-05 DIAGNOSIS — C3490 Malignant neoplasm of unspecified part of unspecified bronchus or lung: Secondary | ICD-10-CM

## 2022-12-05 DIAGNOSIS — I700 Atherosclerosis of aorta: Secondary | ICD-10-CM | POA: Diagnosis not present

## 2022-12-05 DIAGNOSIS — Z79899 Other long term (current) drug therapy: Secondary | ICD-10-CM | POA: Diagnosis not present

## 2022-12-05 NOTE — Progress Notes (Signed)
[facility] Telephone:(336) [phone]   Fax:([phone]  OFFICE PROGRESS NOTE  Shelva Majestic, MD [address] [place] [address]  DIAGNOSIS: Stage IA (T1b, N0, M0) non-small cell lung cancer, squamous cell carcinoma diagnosed in March 2019.  PRIOR THERAPY:  Status post right lower lobectomy with lymph node dissection under the care of Dr. Tyrone Sage on September 16, 2017.  CURRENT THERAPY: Observation.  INTERVAL HISTORY: Margaret Charles 72 y.o. female returns to the clinic today for follow-up visit.  The patient is feeling fine today with no concerning complaints.  She has no chest pain, shortness of breath, cough or hemoptysis.  She has no nausea, vomiting, diarrhea or constipation.  She has no headache or visual changes.  She has no recent weight loss or night sweats.  She is here today for evaluation with repeat CT scan of the chest for restaging of her disease.  MEDICAL HISTORY: Past Medical History:  Diagnosis Date   A-fib [facility])    post op lung transfer, never have been on medication   Allergy    SEASONAL   Arthritis    ddd   Cataract    BILATERAL,REMOVED   Chronic headaches    imprved recently per patient    Emphysema of lung (HCC)    asymptomatic. noted on CT   Family history of colon cancer    GERD (gastroesophageal reflux disease)    History of adenomatous polyp of colon    2015 precancerous polyp history- due spring 2020.  5 years   History of seizures    1980s few ??? seizure activity  (vagal response)LAST ONE IN THE 1980'S NONE SINCE UPDATED 04/18/22   Hyperlipidemia    Hypertension    Lung cancer (HCC) dx'd 07/2017   Neuromuscular disorder (HCC)    NERVE DAMAGE   Palpitations    RBBB (right bundle branch block)    palpitations   Seizures (HCC)    Skin cancer    skin basal cell cancer   Substance abuse (HCC)    ETOH,ABUSE SOBER FOR 40 YEARS UPDATED 04/18/22   Thyroid disease    HYPO    ALLERGIES:  is allergic to codeine  and quinolones.  MEDICATIONS:  Current Outpatient Medications  Medication Sig Dispense Refill   acetaminophen (TYLENOL) 325 MG tablet Take 650 mg by mouth every 6 (six) hours as needed for mild pain.     benzonatate (TESSALON) 100 MG capsule Take 1 capsule (100 mg total) by mouth 3 (three) times daily as needed for cough. (Patient not taking: Reported on 07/23/2022) 30 capsule 0   fluticasone (FLONASE) 50 MCG/ACT nasal spray Place 1 spray into both nostrils daily.     levothyroxine (SYNTHROID) 75 MCG tablet Take 1 tablet (75 mcg total) by mouth daily. 90 tablet 3   metoprolol tartrate (LOPRESSOR) 25 MG tablet TAKE 1 TABLET BY MOUTH TWICE A DAY 180 tablet 3   Multiple Vitamins-Minerals (CENTRUM ADULTS PO) Take 1 tablet by mouth daily.     Propylene Glycol-Glycerin (SOOTHE OP) Place 1 drop into both eyes 2 (two) times daily as needed (dry eyes).     rosuvastatin (CRESTOR) 5 MG tablet TAKE 1 TABLET BY MOUTH EVERY MONDAY, WEDNESDAY, AND FRIDAY. 36 tablet 4   Current Facility-Administered Medications  Medication Dose Route Frequency Provider Last Rate Last Admin   0.9 %  sodium chloride infusion  500 mL Intravenous Continuous Armbruster, Willaim Rayas, MD        SURGICAL  HISTORY:  Past Surgical History:  Procedure Laterality Date   BLADDER SURGERY  1985   Long term incontinence. [facility]- used small intestine to build bladder   BREAST BIOPSY  2001   CATARACT EXTRACTION W/ INTRAOCULAR LENS  IMPLANT, BILATERAL     Intestinal blockage  1989 and 1990   bowel obstruction surgery   Lung cancer removal   08/2017   Right lower lobe   VIDEO ASSISTED THORACOSCOPY (VATS)/WEDGE RESECTION Right 09/16/2017   Procedure: VIDEO ASSISTED THORACOSCOPY (VATS)/LUNG RESECTION;  Surgeon: Delight Ovens, MD;  Location: [facility] OR;  Service: Thoracic;  Laterality: Right;   VIDEO BRONCHOSCOPY N/A 09/16/2017   Procedure: VIDEO BRONCHOSCOPY;  Surgeon: Delight Ovens, MD;  Location: [facility] OR;  Service: Thoracic;  Laterality:  N/A;   WRIST SURGERY     de quervains tenosynovitis. Dr. Despina Hick actually did this.     REVIEW OF SYSTEMS:  A comprehensive review of systems was negative.   PHYSICAL EXAMINATION: General appearance: alert, cooperative, and no distress Head: Normocephalic, without obvious abnormality, atraumatic Neck: no adenopathy, no JVD, supple, symmetrical, trachea midline, and thyroid not enlarged, symmetric, no tenderness/mass/nodules Lymph nodes: Cervical, supraclavicular, and axillary nodes normal. Resp: clear to auscultation bilaterally Back: symmetric, no curvature. ROM normal. No CVA tenderness. Cardio: regular rate and rhythm, S1, S2 normal, no murmur, click, rub or gallop GI: soft, non-tender; bowel sounds normal; no masses,  no organomegaly Extremities: extremities normal, atraumatic, no cyanosis or edema  ECOG PERFORMANCE STATUS: 1 - Symptomatic but completely ambulatory  Blood pressure 121/87, pulse 67, temperature 97.7 F (36.5 C), temperature source Oral, resp. rate 18, weight 143 lb 12.8 oz (65.2 kg), SpO2 99 %.  LABORATORY DATA: Lab Results  Component Value Date   WBC 9.4 12/03/2022   HGB 12.6 12/03/2022   HCT 36.7 12/03/2022   MCV 90.0 12/03/2022   PLT 281 12/03/2022      Chemistry      Component Value Date/Time   NA 137 12/03/2022 1016   K 5.0 12/03/2022 1016   CL 105 12/03/2022 1016   CO2 28 12/03/2022 1016   BUN 12 12/03/2022 1016   CREATININE 0.81 12/03/2022 1016   CREATININE 0.81 03/30/2020 0920      Component Value Date/Time   CALCIUM 9.5 12/03/2022 1016   ALKPHOS 94 12/03/2022 1016   AST 19 12/03/2022 1016   ALT 16 12/03/2022 1016   BILITOT 0.6 12/03/2022 1016       RADIOGRAPHIC STUDIES: CT Chest W Contrast  Result Date: 12/05/2022 CLINICAL DATA:  Non-small cell lung cancer; * Tracking Code: BO * EXAM: CT CHEST WITH CONTRAST TECHNIQUE: Multidetector CT imaging of the chest was performed during intravenous contrast administration. RADIATION DOSE  REDUCTION: This exam was performed according to the departmental dose-optimization program which includes automated exposure control, adjustment of the mA and/or kV according to patient size and/or use of iterative reconstruction technique. CONTRAST:  80mL OMNIPAQUE IOHEXOL 300 MG/ML  SOLN COMPARISON:  Chest CT dated December 10, 2021 FINDINGS: Cardiovascular: Normal heart size. No pericardial effusion. Mildly dilated ascending thoracic aorta, measuring up to 4.0 cm, unchanged. Moderate atherosclerotic disease of the thoracic aorta. Moderate coronary artery calcifications. Mediastinum/Nodes: Esophagus thyroid are unremarkable. No enlarged lymph nodes seen in the chest. Lungs/Pleura: Stable postsurgical changes of right lower lobectomy. Remaining central airways are patent. Mild centrilobular emphysema. No new or enlarging pulmonary nodules. Stable small right pleural effusion. Upper Abdomen: No acute abnormality. Musculoskeletal: No aggressive appearing osseous lesions. IMPRESSION: 1. Stable postsurgical changes  of right lower lobectomy. No evidence of recurrent or metastatic disease in the chest. 2. Stable small right pleural effusion. 3. Stable mildly dilated ascending aorta measuring up to 4.0 cm. 4. Aortic Atherosclerosis (ICD10-I70.0) and Emphysema (ICD10-J43.9). Electronically Signed   By: Allegra Lai M.D.   On: 12/05/2022 09:39     ASSESSMENT AND PLAN: This is a very pleasant 72 years old white female with a stage Ia non-small cell lung cancer status post right lower lobectomy with lymph node dissection in March 2019 under the care of Dr. Tyrone Sage.   The patient has been on observation for the last 5 years and she is doing fine with no concerning complaints. She had repeat CT scan of the chest performed recently.  I personally and independently reviewed her scan and discussed the result with the patient.  Her scan showed no concerning findings for disease recurrence or metastasis. I recommended her to  continue on observation with repeat CT scan of the chest in 1 year for restaging of her disease and also close monitoring for the dilated ascending aorta. The patient was advised to call immediately if she has any concerning symptoms in the interval. The patient voices understanding of current disease status and treatment options and is in agreement with the current care plan.  All questions were answered. The patient knows to call the clinic with any problems, questions or concerns. We can certainly see the patient much sooner if necessary.   Disclaimer: This note was dictated with voice recognition software. Similar sounding words can inadvertently be transcribed and may not be corrected upon review.

## 2022-12-10 ENCOUNTER — Ambulatory Visit: Payer: Medicare PPO | Admitting: Thoracic Surgery (Cardiothoracic Vascular Surgery)

## 2022-12-10 ENCOUNTER — Encounter: Payer: Self-pay | Admitting: Thoracic Surgery (Cardiothoracic Vascular Surgery)

## 2022-12-10 VITALS — BP 107/68 | HR 62 | Resp 18 | Ht 67.0 in | Wt 143.0 lb

## 2022-12-10 DIAGNOSIS — Z08 Encounter for follow-up examination after completed treatment for malignant neoplasm: Secondary | ICD-10-CM | POA: Diagnosis not present

## 2022-12-10 DIAGNOSIS — I7121 Aneurysm of the ascending aorta, without rupture: Secondary | ICD-10-CM

## 2022-12-10 DIAGNOSIS — Z85118 Personal history of other malignant neoplasm of bronchus and lung: Secondary | ICD-10-CM | POA: Diagnosis not present

## 2022-12-10 NOTE — Progress Notes (Signed)
[address]       Margaret Charles [address]             [phone]     HPI: Margaret Charles returns for follow-up of her ascending aneurysm/post lobectomy for lung cancer.  Margaret Charles is a 72 year old woman with a history of tobacco abuse (quit 2010), stage Ia non-small cell carcinoma of the lung, postoperative intercostal neuralgia, postoperative atrial fibrillation, arthritis, hypertension, right bundle branch block, skin cancer, and an ascending aortic aneurysm.  She had a right lower lobectomy by Dr. Tyrone Sage for a stage Ia non-small cell carcinoma in 2019.  On a follow-up scan she was noted to have a 4.1 cm ascending aneurysm.  I last saw her in June 2023.  She had no evidence of recurrent lung cancer and her aneurysm was stable at 4.1 cm.  In the interim since her last visit she has been feeling well.  She did have an episode of atrial fibrillation in her sleep while traveling to [place].  She was notified by her Apple Watch.  She saw Dr. Elease Hashimoto and is being monitored but has not had any additional episodes.  Continues to have difficulty with intercostal neuralgia.  Tried acupuncture but it did not work.  Past Medical History:  Diagnosis Date   A-fib [facility])    post op lung transfer, never have been on medication   Allergy    SEASONAL   Arthritis    ddd   Cataract    BILATERAL,REMOVED   Chronic headaches    imprved recently per patient    Emphysema of lung (HCC)    asymptomatic. noted on CT   Family history of colon cancer    GERD (gastroesophageal reflux disease)    History of adenomatous polyp of colon    2015 precancerous polyp history- due spring 2020.  5 years   History of seizures    1980s few ??? seizure activity  (vagal response)LAST ONE IN THE 1980'S NONE SINCE UPDATED 04/18/22   Hyperlipidemia    Hypertension    Lung cancer (HCC) dx'd 07/2017   Neuromuscular disorder (HCC)    NERVE DAMAGE   Palpitations    RBBB (right bundle branch block)     palpitations   Seizures (HCC)    Skin cancer    skin basal cell cancer   Substance abuse (HCC)    ETOH,ABUSE SOBER FOR 40 YEARS UPDATED 04/18/22   Thyroid disease    HYPO    Current Outpatient Medications  Medication Sig Dispense Refill   acetaminophen (TYLENOL) 325 MG tablet Take 650 mg by mouth every 6 (six) hours as needed for mild pain.     fluticasone (FLONASE) 50 MCG/ACT nasal spray Place 1 spray into both nostrils daily.     levothyroxine (SYNTHROID) 75 MCG tablet Take 1 tablet (75 mcg total) by mouth daily. 90 tablet 3   metoprolol tartrate (LOPRESSOR) 25 MG tablet TAKE 1 TABLET BY MOUTH TWICE A DAY 180 tablet 3   Multiple Vitamins-Minerals (CENTRUM ADULTS PO) Take 1 tablet by mouth daily.     Propylene Glycol-Glycerin (SOOTHE OP) Place 1 drop into both eyes 2 (two) times daily as needed (dry eyes).     rosuvastatin (CRESTOR) 5 MG tablet TAKE 1 TABLET BY MOUTH EVERY MONDAY, WEDNESDAY, AND FRIDAY. 36 tablet 4   Current Facility-Administered Medications  Medication Dose Route Frequency Provider Last Rate Last Admin   0.9 %  sodium chloride infusion  500 mL Intravenous  Continuous Armbruster, Willaim Rayas, MD        Physical Exam BP 107/68 (BP Location: Left Arm, Patient Position: Sitting)   Pulse 62   Resp 18   Ht 5\' 7"  (1.702 m)   Wt 143 lb (64.9 kg)   SpO2 99% Comment: RA  BMI 22.68 kg/m  72 year old woman in no acute distress Alert and oriented x 3 with no focal deficits Lungs diminished at right base but otherwise clear Cardiac regular rate and rhythm with normal S1 and S2 with no murmurs No cervical or supraclavicular adenopathy No peripheral edema  Diagnostic Tests: CT CHEST WITH CONTRAST   TECHNIQUE: Multidetector CT imaging of the chest was performed during intravenous contrast administration.   RADIATION DOSE REDUCTION: This exam was performed according to the departmental dose-optimization program which includes automated exposure control, adjustment of  the mA and/or kV according to patient size and/or use of iterative reconstruction technique.   CONTRAST:  80mL OMNIPAQUE IOHEXOL 300 MG/ML  SOLN   COMPARISON:  Chest CT dated December 10, 2021   FINDINGS: Cardiovascular: Normal heart size. No pericardial effusion. Mildly dilated ascending thoracic aorta, measuring up to 4.0 cm, unchanged. Moderate atherosclerotic disease of the thoracic aorta. Moderate coronary artery calcifications.   Mediastinum/Nodes: Esophagus thyroid are unremarkable. No enlarged lymph nodes seen in the chest.   Lungs/Pleura: Stable postsurgical changes of right lower lobectomy. Remaining central airways are patent. Mild centrilobular emphysema. No new or enlarging pulmonary nodules. Stable small right pleural effusion.   Upper Abdomen: No acute abnormality.   Musculoskeletal: No aggressive appearing osseous lesions.   IMPRESSION: 1. Stable postsurgical changes of right lower lobectomy. No evidence of recurrent or metastatic disease in the chest. 2. Stable small right pleural effusion. 3. Stable mildly dilated ascending aorta measuring up to 4.0 cm. 4. Aortic Atherosclerosis (ICD10-I70.0) and Emphysema (ICD10-J43.9).     Electronically Signed   By: Allegra Lai M.D.   On: 12/05/2022 09:39   I personally reviewed the CT images.  Stable 4.1 cm ascending aneurysm.  Aortic and coronary atherosclerosis persists.  Stable postoperative changes with no evidence of recurrent lung nodules or mediastinal or hilar adenopathy.  Impression: Margaret Charles is a 72 year old woman with a history of tobacco abuse (quit 2010), stage Ia non-small cell carcinoma of the lung, postoperative intercostal neuralgia, postoperative atrial fibrillation, arthritis, hypertension, right bundle branch block, skin cancer, and an ascending aortic aneurysm.  Stage Ia non-small cell carcinoma of the lung-status post right lower lobectomy by Dr. Tyrone Sage in 2019.  Now 5 years out with no  evidence of recurrent disease.  Being followed by Dr. Arbutus Ped.  History of tobacco abuse-quit smoking in 2010.  Ascending aneurysm-stable at 4.1 cm.  She does have a family history with her sister dying of a ruptured aneurysm in her chest.  Needs continued annual follow-up.  Blood pressure well-controlled.  Postoperative intercostal neuralgia-remains problematic.  Has tried multiple therapies without any significant relief.  Plan: Return in 1 year after CT chest with Dr. Arbutus Ped  Spent over 20 minutes in review of records, images, and in consultation with Margaret Charles today. Loreli Slot, MD [facility] [phone]

## 2022-12-23 DIAGNOSIS — M24571 Contracture, right ankle: Secondary | ICD-10-CM | POA: Diagnosis not present

## 2022-12-23 DIAGNOSIS — M25371 Other instability, right ankle: Secondary | ICD-10-CM | POA: Diagnosis not present

## 2022-12-23 DIAGNOSIS — M792 Neuralgia and neuritis, unspecified: Secondary | ICD-10-CM | POA: Diagnosis not present

## 2022-12-23 DIAGNOSIS — M24572 Contracture, left ankle: Secondary | ICD-10-CM | POA: Diagnosis not present

## 2022-12-23 DIAGNOSIS — M722 Plantar fascial fibromatosis: Secondary | ICD-10-CM | POA: Diagnosis not present

## 2023-01-13 DIAGNOSIS — M722 Plantar fascial fibromatosis: Secondary | ICD-10-CM | POA: Diagnosis not present

## 2023-01-13 DIAGNOSIS — M25371 Other instability, right ankle: Secondary | ICD-10-CM | POA: Diagnosis not present

## 2023-01-13 DIAGNOSIS — M792 Neuralgia and neuritis, unspecified: Secondary | ICD-10-CM | POA: Diagnosis not present

## 2023-02-06 ENCOUNTER — Encounter (INDEPENDENT_AMBULATORY_CARE_PROVIDER_SITE_OTHER): Payer: Self-pay

## 2023-02-14 ENCOUNTER — Other Ambulatory Visit: Payer: Self-pay | Admitting: Family Medicine

## 2023-02-14 DIAGNOSIS — E039 Hypothyroidism, unspecified: Secondary | ICD-10-CM

## 2023-02-25 DIAGNOSIS — M722 Plantar fascial fibromatosis: Secondary | ICD-10-CM | POA: Diagnosis not present

## 2023-02-25 DIAGNOSIS — M25371 Other instability, right ankle: Secondary | ICD-10-CM | POA: Diagnosis not present

## 2023-02-25 DIAGNOSIS — M792 Neuralgia and neuritis, unspecified: Secondary | ICD-10-CM | POA: Diagnosis not present

## 2023-03-04 ENCOUNTER — Encounter: Payer: Self-pay | Admitting: Family Medicine

## 2023-03-05 ENCOUNTER — Other Ambulatory Visit: Payer: Self-pay

## 2023-03-05 DIAGNOSIS — R42 Dizziness and giddiness: Secondary | ICD-10-CM

## 2023-03-06 ENCOUNTER — Telehealth: Payer: Self-pay | Admitting: Family Medicine

## 2023-03-06 NOTE — Telephone Encounter (Signed)
This referral has been updated and re-routed to [facility] ([address])  See referral for updated notes.   They will review the referral and contact the patient to schedule.

## 2023-03-06 NOTE — Telephone Encounter (Signed)
Patient requests Referral for Vestibular Therapy PT to be sent to [facility] located at [address], [place], [place]

## 2023-03-10 ENCOUNTER — Other Ambulatory Visit: Payer: Self-pay

## 2023-03-10 ENCOUNTER — Ambulatory Visit: Payer: Medicare PPO | Attending: Family Medicine | Admitting: Physical Therapy

## 2023-03-10 ENCOUNTER — Encounter: Payer: Self-pay | Admitting: Physical Therapy

## 2023-03-10 VITALS — BP 150/89 | HR 73

## 2023-03-10 DIAGNOSIS — R42 Dizziness and giddiness: Secondary | ICD-10-CM | POA: Diagnosis not present

## 2023-03-10 DIAGNOSIS — R2681 Unsteadiness on feet: Secondary | ICD-10-CM | POA: Insufficient documentation

## 2023-03-10 NOTE — Therapy (Unsigned)
OUTPATIENT PHYSICAL THERAPY VESTIBULAR EVALUATION     Patient Name: Margaret Charles MRN: [id number] DOB:23-Nov-1950, 72 y.o., female Today's Date: 03/11/2023  END OF SESSION:  PT End of Session - 03/10/23 1621     Visit Number 1    Number of Visits 7   including eval   Date for PT Re-Evaluation 04/15/23    Authorization Type [employer]    PT Start Time 1620    PT Stop Time 1700    PT Time Calculation (min) 40 min    Equipment Utilized During Treatment Other (comment)   performed at seated level   Activity Tolerance Patient tolerated treatment well    Behavior During Therapy WFL for tasks assessed/performed             Past Medical History:  Diagnosis Date   A-fib (HCC)    post op lung transfer, never have been on medication   Allergy    SEASONAL   Arthritis    ddd   Cataract    BILATERAL,REMOVED   Chronic headaches    imprved recently per patient    Emphysema of lung (HCC)    asymptomatic. noted on CT   Family history of colon cancer    GERD (gastroesophageal reflux disease)    History of adenomatous polyp of colon    2015 precancerous polyp history- due spring 2020.  5 years   History of seizures    1980s few ??? seizure activity  (vagal response)LAST ONE IN THE 1980'S NONE SINCE UPDATED 04/18/22   Hyperlipidemia    Hypertension    Lung cancer (HCC) dx'd 07/2017   Neuromuscular disorder (HCC)    NERVE DAMAGE   Palpitations    RBBB (right bundle branch block)    palpitations   Seizures (HCC)    Skin cancer    skin basal cell cancer   Substance abuse (HCC)    ETOH,ABUSE SOBER FOR 40 YEARS UPDATED 04/18/22   Thyroid disease    HYPO   Past Surgical History:  Procedure Laterality Date   BLADDER SURGERY  1985   Long term incontinence. [facility]- used small intestine to build bladder   BREAST BIOPSY  2001   CATARACT EXTRACTION W/ INTRAOCULAR LENS  IMPLANT, BILATERAL     Intestinal blockage  1989 and 1990   bowel obstruction surgery   Lung cancer removal    08/2017   Right lower lobe   VIDEO ASSISTED THORACOSCOPY (VATS)/WEDGE RESECTION Right 09/16/2017   Procedure: VIDEO ASSISTED THORACOSCOPY (VATS)/LUNG RESECTION;  Surgeon: Delight Ovens, MD;  Location: [facility] OR;  Service: Thoracic;  Laterality: Right;   VIDEO BRONCHOSCOPY N/A 09/16/2017   Procedure: VIDEO BRONCHOSCOPY;  Surgeon: Delight Ovens, MD;  Location: [facility] OR;  Service: Thoracic;  Laterality: N/A;   WRIST SURGERY     de quervains tenosynovitis. Dr. Despina Hick actually did this.    Patient Active Problem List   Diagnosis Date Noted   Aortic atherosclerosis (HCC) 04/02/2021   History of adenomatous polyp of colon 05/11/2019   Hypothyroidism 04/30/2019   Ascending aortic aneurysm (HCC) 08/27/2018   Benign paroxysmal positional vertigo of right ear 05/25/2018   Hypertension 12/29/2017   History of atrial fibrillation 12/29/2017   History of skin cancer 12/29/2017   Alcoholism in remission (HCC) 12/29/2017   Chronic headaches    Emphysema of lung (HCC)    Left thyroid nodule 10/07/2017   S/P lobectomy of lung 09/16/2017   Lens replaced by other means 08/12/2017   Tear film insufficiency  08/12/2017   Vitreous degeneration 08/12/2017   Cervicogenic headache 08/11/2017   DDD (degenerative disc disease), cervical 08/11/2017   History of lung cancer- right lower lobe s/p right lobectomy 08/06/2017   Lesion of right lung 08/06/2017   Arthritis of facet joint of cervical spine 07/28/2017   Palpitations 03/25/2017   RBBB 07/11/2015   Senile nuclear sclerosis 12/25/2011   Myopia 12/10/2011    PCP: Shelva Majestic, MD REFERRING PROVIDER: Shelva Majestic, MD  REFERRING DIAG: R42 (ICD-10-CM) - Vertigo  THERAPY DIAG:  Dizziness and giddiness - Plan: PT plan of care cert/re-cert  Unsteadiness on feet - Plan: PT plan of care cert/re-cert  ONSET DATE: 03/05/2023 referral  Rationale for Evaluation and Treatment: Rehabilitation  SUBJECTIVE:   SUBJECTIVE STATEMENT: Patient  known to this clinic for treatment for left side BPPV. Patient reports repeat of vertigo but this time on the R side. Symptoms started at night on 9/10. Patient has only felt mildly sleeping on L side in April. Took meclizine recently but has not taken any since.   Pt accompanied by: self  PERTINENT HISTORY: h/o RLL lung cancer s/p R lobectomy, hypothyroidism, HTN, h/o afib, BPPV, ascending aortic aneurysm, chronic headache, who presents with vertigo  PAIN:  Are you having pain? No  PRECAUTIONS: Fall  WEIGHT BEARING RESTRICTIONS: No  FALLS: Has patient fallen in last 6 months? Yes. Number of falls 1 - reports one fall when stepping on step, tripped picking feet  LIVING ENVIRONMENT: Lives with: lives alone Lives in: House/apartment Stairs: No Has following equipment at home: None  PLOF: Independent; driving, working   PATIENT GOALS: " to not be dizzy"  OBJECTIVE:   DIAGNOSTIC FINDINGS: No recent head imaging  CT Chest W Contrast:  IMPRESSION: 1. Stable postsurgical changes of right lower lobectomy. No evidence of recurrent or metastatic disease in the chest. 2. Stable small right pleural effusion. 3. Stable mildly dilated ascending aorta measuring up to 4.0 cm. 4. Aortic Atherosclerosis (ICD10-I70.0) and Emphysema (ICD10-J43.9). COGNITION: Overall cognitive status: Within functional limits for tasks assessed   SENSATION: WFL  POSTURE:  No Significant postural limitations  Cervical ROM:   WFL  STRENGTH: WFL  BED MOBILITY:  Denies difficulty, intermittent dizziness  PATIENT SURVEYS:  FOTO: 50 (Predicted 47)  VESTIBULAR ASSESSMENT:  GENERAL OBSERVATION:  ambulates without AD; just wears reading glasses   SYMPTOM BEHAVIOR:  Subjective history: see above  Non-Vestibular symptoms: nausea/vomiting  Type of dizziness: Spinning/Vertigo Frequency: depends on the position I am in, was daily or a few times a day  Duration: seconds Aggravating factors:  Spontaneous, Induced by position change: rolling to the left and sit to stand, and Induced by motion: bending down to the ground Relieving factors: head stationary, medication, and rest  Progression of symptoms: better  OCULOMOTOR EXAM:  Ocular Alignment: normal  Ocular ROM: No Limitations  Spontaneous Nystagmus: absent  Gaze-Induced Nystagmus: absent  Smooth Pursuits: intact  Saccades: intact  Convergence/Divergence: <5 cm   Test of Skew: negative  VBI: negative bilaterally  VESTIBULAR - OCULAR REFLEX:   Slow VOR: Normal  VOR Cancellation: Normal  Head-Impulse Test: HIT Right: negative HIT Left: negative   POSITIONAL TESTING:  Right Dix-Hallpike: nystagmus noted with ~15 second delay, <30 second duration, atypical presentation with diagonal/horizontal blend appeared more similar to ageotropic Left Dix-Hallpike: nystagmus noted with ~10 second delay, < 30 second duration, atypical presentation with diagonal/horizontal blend appeared more similar to geotropic Right Roll Test: no nystagmus Left Roll Test: no nystagmus  MOTION SENSITIVITY:  Motion Sensitivity Quotient Intensity: 0 = none, 1 = Lightheaded, 2 = Mild, 3 = Moderate, 4 = Severe, 5 = Vomiting  Intensity  1. Sitting to supine 2  2. Supine to L side   3. Supine to R side   4. Supine to sitting 2  5. L Hallpike-Dix 2  6. Up from L  2  7. R Hallpike-Dix 2  8. Up from R  2  9. Sitting, head tipped to L knee   10. Head up from L knee   11. Sitting, head tipped to R knee   12. Head up from R knee   13. Sitting head turns x5   14.Sitting head nods x5   15. In stance, 180 turn to L    16. In stance, 180 turn to R      VESTIBULAR TREATMENT:                                                                                                   Initial Eval only - Held canalith repositioning given atypical presentation; recommend retesting positional and treating as indicated  PATIENT EDUCATION: Education details:  Examination findings, goal collaboration, POC moving forward Person educated: Patient Education method: Explanation Education comprehension: verbalized understanding and needs further education  HOME EXERCISE PROGRAM:  To be provided as indicated  GOALS: Goals reviewed with patient? Yes  SHORT TERM GOALS: = LTG based on PT POC  LONG TERM GOALS: Target date: 04/15/2023  Pt will be independent with final HEP for improved symptom report  Baseline: to be provided  Goal status: INITIAL  2.  Patient will demonstrate (-) positional testing to indicate resolution of BPPV  Baseline: (+) for nystagmus in L and R [employer] though presentation atypical Goal status: INITIAL  3.  Patient will improve FOTO score to >/= 62 to demonstrate improvement/ resolution of symptoms Baseline: 50 Goal status: INITIAL  ASSESSMENT:  CLINICAL IMPRESSION: Patient is a 72 y.o. female who was seen today for physical therapy evaluation and treatment for dizziness. She has a distant h/o BPPV and treated previous at this clinic for L posterior canal BPPV in 09/2022. No central signs observed on eval; however, patient presents with atypical nystagmus in L and R [employer] positioning with nystagmus that appears more ageotropic to R and geotropic to L though horizontal canals when first assessed. Given time constraints of session therapist was unable to recheck canals and recommend rechecking all positional testing and treat as indicated. Longer delay of ~15 seconds before onset of nystagmus noted as above with duration < 30 seconds. Patient would benefit from skilled PT services to address the above mentioned deficits.   OBJECTIVE IMPAIRMENTS: dizziness.   ACTIVITY LIMITATIONS: bending, sleeping, bed mobility, and locomotion level  PARTICIPATION LIMITATIONS: laundry, driving, shopping, community activity, occupation, and yard work  PERSONAL FACTORS: Age, Past/current experiences, Sex, and 3+ comorbidities: see  above  are also affecting patient's functional outcome.   REHAB POTENTIAL: Good  CLINICAL DECISION MAKING: Stable/uncomplicated  EVALUATION COMPLEXITY: Low   PLAN:  PT FREQUENCY: 2x/week  PT  DURATION: 4 weeks  PLANNED INTERVENTIONS: Therapeutic exercises, Therapeutic activity, Neuromuscular re-education, Balance training, Gait training, Patient/Family education, Self Care, Joint mobilization, Stair training, Vestibular training, Canalith repositioning, Visual/preceptual remediation/compensation, DME instructions, Aquatic Therapy, Manual therapy, and Re-evaluation  PLAN FOR NEXT SESSION: re-check all positional testing (hold for longer duration with horizontal canal check), treat as indicated    Carmelia Bake, PT, DPT 03/11/2023, 9:10 AM

## 2023-03-13 ENCOUNTER — Encounter: Payer: Self-pay | Admitting: Podiatry

## 2023-03-13 ENCOUNTER — Ambulatory Visit (INDEPENDENT_AMBULATORY_CARE_PROVIDER_SITE_OTHER): Payer: Self-pay

## 2023-03-13 ENCOUNTER — Ambulatory Visit: Payer: Medicare PPO | Admitting: Podiatry

## 2023-03-13 VITALS — BP 145/81 | HR 74

## 2023-03-13 DIAGNOSIS — M722 Plantar fascial fibromatosis: Secondary | ICD-10-CM | POA: Diagnosis not present

## 2023-03-13 MED ORDER — TRIAMCINOLONE ACETONIDE 10 MG/ML IJ SUSP
10.0000 mg | Freq: Once | INTRAMUSCULAR | Status: AC
Start: 2023-03-13 — End: 2023-03-13
  Administered 2023-03-13: 10 mg via INTRA_ARTICULAR

## 2023-03-13 NOTE — Patient Instructions (Signed)

## 2023-03-14 ENCOUNTER — Ambulatory Visit: Payer: Medicare PPO

## 2023-03-14 DIAGNOSIS — R2681 Unsteadiness on feet: Secondary | ICD-10-CM | POA: Diagnosis not present

## 2023-03-14 DIAGNOSIS — R42 Dizziness and giddiness: Secondary | ICD-10-CM

## 2023-03-14 NOTE — Progress Notes (Signed)
Subjective:   Patient ID: Margaret Charles, female   DOB: 72 y.o.   MRN: [id number]   HPI Patient presents stating she has a lot of pain in the bottom of her right heel has seen another physician for the last 6 months has had several injections night splint over-the-counter insoles and symptoms continue and it gotten worse recently.  Patient does not smoke likes to be active and walk   Review of Systems  All other systems reviewed and are negative.       Objective:  Physical Exam Vitals and nursing note reviewed.  Constitutional:      Appearance: She is well-developed.  Pulmonary:     Effort: Pulmonary effort is normal.  Musculoskeletal:        General: Normal range of motion.  Skin:    General: Skin is warm.  Neurological:     Mental Status: She is alert.     Neurovascular status intact muscle strength found to be adequate range of motion adequate exquisite discomfort medial fascial band right at the insertion of the tendon calcaneus fluid buildup around the medial band with pain with palpation.  Narrow heel long elongated foot     Assessment:  Acute plantar fasciitis right that has not responded so far conservatively     Plan:  H&P reviewed condition and x-ray and discussed her foot structure.  At this point organ to try aggressive conservative treatment and I did do steroid injection which will be the last 1 for the short-term and also immobilization.  Patient will wear a cam walker which was properly fitted to her lower leg for the next 3 weeks and may require PRP shockwave or eventual surgery if symptoms persist  X-rays indicate spur formation no indication stress fracture arthritis

## 2023-03-14 NOTE — Therapy (Signed)
OUTPATIENT PHYSICAL THERAPY VESTIBULAR EVALUATION     Patient Name: Margaret Charles MRN: [id number] DOB:Dec 21, 1950, 72 y.o., female Today's Date: 03/14/2023  END OF SESSION:  PT End of Session - 03/14/23 1446     Visit Number 2    Number of Visits 7    Date for PT Re-Evaluation 04/15/23    Authorization Type [employer]    PT Start Time 1445    PT Stop Time 1510   deferring tx dt no ride home   PT Time Calculation (min) 25 min    Activity Tolerance Patient tolerated treatment well    Behavior During Therapy WFL for tasks assessed/performed             Past Medical History:  Diagnosis Date   A-fib (HCC)    post op lung transfer, never have been on medication   Allergy    SEASONAL   Arthritis    ddd   Cataract    BILATERAL,REMOVED   Chronic headaches    imprved recently per patient    Emphysema of lung (HCC)    asymptomatic. noted on CT   Family history of colon cancer    GERD (gastroesophageal reflux disease)    History of adenomatous polyp of colon    2015 precancerous polyp history- due spring 2020.  5 years   History of seizures    1980s few ??? seizure activity  (vagal response)LAST ONE IN THE 1980'S NONE SINCE UPDATED 04/18/22   Hyperlipidemia    Hypertension    Lung cancer (HCC) dx'd 07/2017   Neuromuscular disorder (HCC)    NERVE DAMAGE   Palpitations    RBBB (right bundle branch block)    palpitations   Seizures (HCC)    Skin cancer    skin basal cell cancer   Substance abuse (HCC)    ETOH,ABUSE SOBER FOR 40 YEARS UPDATED 04/18/22   Thyroid disease    HYPO   Past Surgical History:  Procedure Laterality Date   BLADDER SURGERY  1985   Long term incontinence. [facility]- used small intestine to build bladder   BREAST BIOPSY  2001   CATARACT EXTRACTION W/ INTRAOCULAR LENS  IMPLANT, BILATERAL     Intestinal blockage  1989 and 1990   bowel obstruction surgery   Lung cancer removal   08/2017   Right lower lobe   VIDEO ASSISTED THORACOSCOPY  (VATS)/WEDGE RESECTION Right 09/16/2017   Procedure: VIDEO ASSISTED THORACOSCOPY (VATS)/LUNG RESECTION;  Surgeon: Delight Ovens, MD;  Location: [facility] OR;  Service: Thoracic;  Laterality: Right;   VIDEO BRONCHOSCOPY N/A 09/16/2017   Procedure: VIDEO BRONCHOSCOPY;  Surgeon: Delight Ovens, MD;  Location: [facility] OR;  Service: Thoracic;  Laterality: N/A;   WRIST SURGERY     de quervains tenosynovitis. Dr. Despina Hick actually did this.    Patient Active Problem List   Diagnosis Date Noted   Aortic atherosclerosis (HCC) 04/02/2021   History of adenomatous polyp of colon 05/11/2019   Hypothyroidism 04/30/2019   Ascending aortic aneurysm (HCC) 08/27/2018   Benign paroxysmal positional vertigo of right ear 05/25/2018   Hypertension 12/29/2017   History of atrial fibrillation 12/29/2017   History of skin cancer 12/29/2017   Alcoholism in remission (HCC) 12/29/2017   Chronic headaches    Emphysema of lung (HCC)    Left thyroid nodule 10/07/2017   S/P lobectomy of lung 09/16/2017   Lens replaced by other means 08/12/2017   Tear film insufficiency 08/12/2017   Vitreous degeneration 08/12/2017   Cervicogenic headache  08/11/2017   DDD (degenerative disc disease), cervical 08/11/2017   History of lung cancer- right lower lobe s/p right lobectomy 08/06/2017   Lesion of right lung 08/06/2017   Arthritis of facet joint of cervical spine 07/28/2017   Palpitations 03/25/2017   RBBB 07/11/2015   Senile nuclear sclerosis 12/25/2011   Myopia 12/10/2011    PCP: Shelva Majestic, MD REFERRING PROVIDER: Shelva Majestic, MD  REFERRING DIAG: R42 (ICD-10-CM) - Vertigo  THERAPY DIAG:  Dizziness and giddiness  Unsteadiness on feet  ONSET DATE: 03/05/2023 referral  Rationale for Evaluation and Treatment: Rehabilitation  SUBJECTIVE:   SUBJECTIVE STATEMENT: Patient arrives to clinic alone. Wearing R CAM boot due to plantar fasciitis. Did take her BP frequently and has bene trending slightly higher,  but not too high. Denies falls. Still with the same dizziness. Did try sleeping on her L side and was too dizzy, but was able to sleep on her R side. Has not taken meclizine since last week.   Pt accompanied by: self  PERTINENT HISTORY: h/o RLL lung cancer s/p R lobectomy, hypothyroidism, HTN, h/o afib, BPPV, ascending aortic aneurysm, chronic headache, who presents with vertigo  PAIN:  Are you having pain? No  PRECAUTIONS: Fall  PATIENT GOALS: " to not be dizzy"  OBJECTIVE:   DIAGNOSTIC FINDINGS: No recent head imaging  CT Chest W Contrast:  IMPRESSION: 1. Stable postsurgical changes of right lower lobectomy. No evidence of recurrent or metastatic disease in the chest. 2. Stable small right pleural effusion. 3. Stable mildly dilated ascending aorta measuring up to 4.0 cm. 4. Aortic Atherosclerosis (ICD10-I70.0) and Emphysema (ICD10-J43.9).  VESTIBULAR ASSESSMENT:   POSITIONAL TESTING:  Right Roll Test: apogeotropic nystagmus Left Roll Test: no nystagmus    VESTIBULAR TREATMENT:                                                                                                  Held canalith repositioning due to no ride home  PATIENT EDUCATION: Education details: exam findings, repeated rolling Person educated: Patient Education method: Explanation Education comprehension: verbalized understanding and needs further education  HOME EXERCISE PROGRAM: Repeated rolling  GOALS: Goals reviewed with patient? Yes  SHORT TERM GOALS: = LTG based on PT POC  LONG TERM GOALS: Target date: 04/15/2023  Pt will be independent with final HEP for improved symptom report  Baseline: to be provided  Goal status: INITIAL  2.  Patient will demonstrate (-) positional testing to indicate resolution of BPPV  Baseline: (+) for nystagmus in L and R [employer] though presentation atypical Goal status: INITIAL  3.  Patient will improve FOTO score to >/= 62 to demonstrate improvement/  resolution of symptoms Baseline: 50 Goal status: INITIAL  ASSESSMENT:  CLINICAL IMPRESSION: Patient seen for skilled PT session with emphasis on positional testing. L roll test (-) for nystagmus, but reported mild dizziness. R roll test (+) for apogeotropic nystagmus with ~10s delay, but lasted >2 mins. Sat patient up before seeing if it would fatigue. This is possibly an atypical presentation of R horizontal canal cupulolithiasis. Patient requesting to defer treatment until  next visit due no ride home and likelihood of feeling dizzy after treatment. Continue POC.   OBJECTIVE IMPAIRMENTS: dizziness.   ACTIVITY LIMITATIONS: bending, sleeping, bed mobility, and locomotion level  PARTICIPATION LIMITATIONS: laundry, driving, shopping, community activity, occupation, and yard work  PERSONAL FACTORS: Age, Past/current experiences, Sex, and 3+ comorbidities: see above  are also affecting patient's functional outcome.   REHAB POTENTIAL: Good  CLINICAL DECISION MAKING: Stable/uncomplicated  EVALUATION COMPLEXITY: Low   PLAN:  PT FREQUENCY: 2x/week  PT DURATION: 4 weeks  PLANNED INTERVENTIONS: Therapeutic exercises, Therapeutic activity, Neuromuscular re-education, Balance training, Gait training, Patient/Family education, Self Care, Joint mobilization, Stair training, Vestibular training, Canalith repositioning, Visual/preceptual remediation/compensation, DME instructions, Aquatic Therapy, Manual therapy, and Re-evaluation  PLAN FOR NEXT SESSION: re-check all positional testing (hold for longer duration with horizontal canal check), treat as indicated, R horizontal canal cupulolithiasis?   Westley Foots, PT Westley Foots, PT, DPT, CBIS  03/14/2023, 3:17 PM

## 2023-03-16 ENCOUNTER — Encounter: Payer: Self-pay | Admitting: Cardiovascular Disease

## 2023-03-18 ENCOUNTER — Encounter: Payer: Self-pay | Admitting: Physical Therapy

## 2023-03-18 ENCOUNTER — Ambulatory Visit: Payer: Self-pay | Admitting: Physical Therapy

## 2023-03-18 VITALS — BP 154/86 | HR 75

## 2023-03-18 DIAGNOSIS — R42 Dizziness and giddiness: Secondary | ICD-10-CM

## 2023-03-18 DIAGNOSIS — R2681 Unsteadiness on feet: Secondary | ICD-10-CM | POA: Diagnosis not present

## 2023-03-18 NOTE — Therapy (Signed)
OUTPATIENT PHYSICAL THERAPY VESTIBULAR TREATMENT     Patient Name: Margaret Charles MRN: [id number] DOB:1951-02-07, 72 y.o., female Today's Date: 03/18/2023  END OF SESSION:  PT End of Session - 03/18/23 1530     Visit Number 3    Number of Visits 7    Date for PT Re-Evaluation 04/15/23    Authorization Type [employer]    PT Start Time 1527    PT Stop Time 1608    PT Time Calculation (min) 41 min    Equipment Utilized During Treatment Other (comment)   performed at mat level   Activity Tolerance Other (comment)   limited secondary to nausea   Behavior During Therapy WFL for tasks assessed/performed             Past Medical History:  Diagnosis Date   A-fib (HCC)    post op lung transfer, never have been on medication   Allergy    SEASONAL   Arthritis    ddd   Cataract    BILATERAL,REMOVED   Chronic headaches    imprved recently per patient    Emphysema of lung (HCC)    asymptomatic. noted on CT   Family history of colon cancer    GERD (gastroesophageal reflux disease)    History of adenomatous polyp of colon    2015 precancerous polyp history- due spring 2020.  5 years   History of seizures    1980s few ??? seizure activity  (vagal response)LAST ONE IN THE 1980'S NONE SINCE UPDATED 04/18/22   Hyperlipidemia    Hypertension    Lung cancer (HCC) dx'd 07/2017   Neuromuscular disorder (HCC)    NERVE DAMAGE   Palpitations    RBBB (right bundle branch block)    palpitations   Seizures (HCC)    Skin cancer    skin basal cell cancer   Substance abuse (HCC)    ETOH,ABUSE SOBER FOR 40 YEARS UPDATED 04/18/22   Thyroid disease    HYPO   Past Surgical History:  Procedure Laterality Date   BLADDER SURGERY  1985   Long term incontinence. [facility]- used small intestine to build bladder   BREAST BIOPSY  2001   CATARACT EXTRACTION W/ INTRAOCULAR LENS  IMPLANT, BILATERAL     Intestinal blockage  1989 and 1990   bowel obstruction surgery   Lung cancer removal    08/2017   Right lower lobe   VIDEO ASSISTED THORACOSCOPY (VATS)/WEDGE RESECTION Right 09/16/2017   Procedure: VIDEO ASSISTED THORACOSCOPY (VATS)/LUNG RESECTION;  Surgeon: Delight Ovens, MD;  Location: [facility] OR;  Service: Thoracic;  Laterality: Right;   VIDEO BRONCHOSCOPY N/A 09/16/2017   Procedure: VIDEO BRONCHOSCOPY;  Surgeon: Delight Ovens, MD;  Location: [facility] OR;  Service: Thoracic;  Laterality: N/A;   WRIST SURGERY     de quervains tenosynovitis. Dr. Despina Hick actually did this.    Patient Active Problem List   Diagnosis Date Noted   Aortic atherosclerosis (HCC) 04/02/2021   History of adenomatous polyp of colon 05/11/2019   Hypothyroidism 04/30/2019   Ascending aortic aneurysm (HCC) 08/27/2018   Benign paroxysmal positional vertigo of right ear 05/25/2018   Hypertension 12/29/2017   History of atrial fibrillation 12/29/2017   History of skin cancer 12/29/2017   Alcoholism in remission (HCC) 12/29/2017   Chronic headaches    Emphysema of lung (HCC)    Left thyroid nodule 10/07/2017   S/P lobectomy of lung 09/16/2017   Lens replaced by other means 08/12/2017   Tear film insufficiency  08/12/2017   Vitreous degeneration 08/12/2017   Cervicogenic headache 08/11/2017   DDD (degenerative disc disease), cervical 08/11/2017   History of lung cancer- right lower lobe s/p right lobectomy 08/06/2017   Lesion of right lung 08/06/2017   Arthritis of facet joint of cervical spine 07/28/2017   Palpitations 03/25/2017   RBBB 07/11/2015   Senile nuclear sclerosis 12/25/2011   Myopia 12/10/2011    PCP: Shelva Majestic, MD REFERRING PROVIDER: Shelva Majestic, MD  REFERRING DIAG: R42 (ICD-10-CM) - Vertigo  THERAPY DIAG:  Dizziness and giddiness  Unsteadiness on feet  ONSET DATE: 03/05/2023 referral  Rationale for Evaluation and Treatment: Rehabilitation  SUBJECTIVE:   SUBJECTIVE STATEMENT: Patient reports onging dizziness. Has not done much repeated rolling due to tolerance  and feeling off after completing. Denies falls/near falls.  Pt accompanied by: self (friend drove patient to clinic)  PERTINENT HISTORY: h/o RLL lung cancer s/p R lobectomy, hypothyroidism, HTN, h/o afib, BPPV, ascending aortic aneurysm, chronic headache, who presents with vertigo  PAIN:  Are you having pain? No  PRECAUTIONS: Fall  PATIENT GOALS: " to not be dizzy"  OBJECTIVE:   DIAGNOSTIC FINDINGS: No recent head imaging  CT Chest W Contrast:  IMPRESSION: 1. Stable postsurgical changes of right lower lobectomy. No evidence of recurrent or metastatic disease in the chest. 2. Stable small right pleural effusion. 3. Stable mildly dilated ascending aorta measuring up to 4.0 cm. 4. Aortic Atherosclerosis (ICD10-I70.0) and Emphysema (ICD10-J43.9).  VESTIBULAR ASSESSMENT:   POSITIONAL TESTING:  Right Roll Test: apogeotropic nystagmus Left Roll Test: no nystagmus and apogeotropic nystagmus  Right roll did not fatigue after 60 seconds, slower with ~10 second delay, left roll apogeotropic with ~10 second onset/more intense symptoms  VESTIBULAR TREATMENT:                                                                                                   Position testing as noted above today consistent with R side cupulolithiasis given duration and apogeotropic direction.   *Utilized alcohol wipe to nose to help manage nausea  Canalith Repositioning: Gufoni maneuver for apogeotropic cupulothiasis nystagmus (R sidelying with head turn up to the ceiling); attempted to perform 1x - patient became nauseous with gagging but no true emesis, sat upright after 1 minute in position 1 of Gufoni, provided emesis bag and cool rag when patient upright, took seated break ~5 minutes, patient requested trying again  Gufoni maneuver for apogeotropic cupulothiasis nystagmus (R sidelying with head turn up to the ceiling) performed 1x complete - patient able to tolerate 2 minutes in each position, did  become nauseous when returned to upright and had true episode of emesis with use of bag  TherAct: Spent remainder of time educating on repeated rolling, educated on tactics to manage nausea and when to stop, provided emesis bag and ginger ail when leaving  PATIENT EDUCATION: Education details: exam findings, continue repeated rolling as tolerated  Person educated: Patient Education method: Explanation Education comprehension: verbalized understanding and needs further education  HOME EXERCISE PROGRAM: Repeated rolling  GOALS: Goals reviewed with patient? Yes  SHORT  TERM GOALS: = LTG based on PT POC  LONG TERM GOALS: Target date: 04/15/2023  Pt will be independent with final HEP for improved symptom report  Baseline: to be provided  Goal status: INITIAL  2.  Patient will demonstrate (-) positional testing to indicate resolution of BPPV  Baseline: (+) for nystagmus in L and R [employer] though presentation atypical Goal status: INITIAL  3.  Patient will improve FOTO score to >/= 62 to demonstrate improvement/ resolution of symptoms Baseline: 50 Goal status: INITIAL  ASSESSMENT:  CLINICAL IMPRESSION: Patient seen for skilled PT session with emphasis on positional testing. L roll test (+) for strong fast apogeotropic nystagmus and R roll test (+) for slower phase apogeotropic. Nystagmus did not fatigue. Given presentation treated for R side cupulolithiasis of horizontal canal. Patient was limited by nausea during today's session. Required sitting up halfway through initial attempt of Gufoni maneuver but able to tolerate full second attempt before episode of emesis into emesis bag. Educated on continuing repeated rolling at home as able. Continue POC as tolerated.   OBJECTIVE IMPAIRMENTS: dizziness.   ACTIVITY LIMITATIONS: bending, sleeping, bed mobility, and locomotion level  PARTICIPATION LIMITATIONS: laundry, driving, shopping, community activity, occupation, and yard  work  PERSONAL FACTORS: Age, Past/current experiences, Sex, and 3+ comorbidities: see above  are also affecting patient's functional outcome.   REHAB POTENTIAL: Good  CLINICAL DECISION MAKING: Stable/uncomplicated  EVALUATION COMPLEXITY: Low   PLAN:  PT FREQUENCY: 2x/week  PT DURATION: 4 weeks  PLANNED INTERVENTIONS: Therapeutic exercises, Therapeutic activity, Neuromuscular re-education, Balance training, Gait training, Patient/Family education, Self Care, Joint mobilization, Stair training, Vestibular training, Canalith repositioning, Visual/preceptual remediation/compensation, DME instructions, Aquatic Therapy, Manual therapy, and Re-evaluation  PLAN FOR NEXT SESSION: recheck positonal treat as indicated, R horizontal canal cupulolithiasis? (Gufoni versus head shake and barbaque roll), how is repeated rolling going, recommend use of emesis bag and alochol wipes for nausea    Carmelia Bake, PT, DPT  03/18/2023, 5:18 PM

## 2023-03-20 ENCOUNTER — Encounter: Payer: Medicare PPO | Admitting: Physical Therapy

## 2023-03-21 ENCOUNTER — Ambulatory Visit: Payer: Medicare PPO

## 2023-03-21 DIAGNOSIS — R42 Dizziness and giddiness: Secondary | ICD-10-CM

## 2023-03-21 DIAGNOSIS — R2681 Unsteadiness on feet: Secondary | ICD-10-CM | POA: Diagnosis not present

## 2023-03-21 NOTE — Therapy (Signed)
OUTPATIENT PHYSICAL THERAPY VESTIBULAR TREATMENT     Patient Name: Margaret Charles MRN: [id number] DOB:[birth year], 72 y.o., female Today's Date: 03/21/2023  END OF SESSION:  PT End of Session - 03/21/23 1444     Visit Number 4    Number of Visits 7    Date for PT Re-Evaluation 04/15/23    Authorization Type [employer]    PT Start Time 1442    PT Stop Time 1523    PT Time Calculation (min) 41 min    Activity Tolerance Other (comment)   limited by nausea   Behavior During Therapy WFL for tasks assessed/performed             Past Medical History:  Diagnosis Date   A-fib (HCC)    post op lung transfer, never have been on medication   Allergy    SEASONAL   Arthritis    ddd   Cataract    BILATERAL,REMOVED   Chronic headaches    imprved recently per patient    Emphysema of lung (HCC)    asymptomatic. noted on CT   Family history of colon cancer    GERD (gastroesophageal reflux disease)    History of adenomatous polyp of colon    2015 precancerous polyp history- due spring 2020.  5 years   History of seizures    1980s few ??? seizure activity  (vagal response)LAST ONE IN THE 1980'S NONE SINCE UPDATED 04/18/22   Hyperlipidemia    Hypertension    Lung cancer (HCC) dx'd 07/2017   Neuromuscular disorder (HCC)    NERVE DAMAGE   Palpitations    RBBB (right bundle branch block)    palpitations   Seizures (HCC)    Skin cancer    skin basal cell cancer   Substance abuse (HCC)    ETOH,ABUSE SOBER FOR 40 YEARS UPDATED 04/18/22   Thyroid disease    HYPO   Past Surgical History:  Procedure Laterality Date   BLADDER SURGERY  1985   Long term incontinence. [facility]- used small intestine to build bladder   BREAST BIOPSY  2001   CATARACT EXTRACTION W/ INTRAOCULAR LENS  IMPLANT, BILATERAL     Intestinal blockage  1989 and 1990   bowel obstruction surgery   Lung cancer removal   08/2017   Right lower lobe   VIDEO ASSISTED THORACOSCOPY (VATS)/WEDGE RESECTION Right  09/16/2017   Procedure: VIDEO ASSISTED THORACOSCOPY (VATS)/LUNG RESECTION;  Surgeon: Delight Ovens, MD;  Location: [facility] OR;  Service: Thoracic;  Laterality: Right;   VIDEO BRONCHOSCOPY N/A 09/16/2017   Procedure: VIDEO BRONCHOSCOPY;  Surgeon: Delight Ovens, MD;  Location: [facility] OR;  Service: Thoracic;  Laterality: N/A;   WRIST SURGERY     de quervains tenosynovitis. Dr. Despina Hick actually did this.    Patient Active Problem List   Diagnosis Date Noted   Aortic atherosclerosis (HCC) 04/02/2021   History of adenomatous polyp of colon 05/11/2019   Hypothyroidism 04/30/2019   Ascending aortic aneurysm (HCC) 08/27/2018   Benign paroxysmal positional vertigo of right ear 05/25/2018   Hypertension 12/29/2017   History of atrial fibrillation 12/29/2017   History of skin cancer 12/29/2017   Alcoholism in remission (HCC) 12/29/2017   Chronic headaches    Emphysema of lung (HCC)    Left thyroid nodule 10/07/2017   S/P lobectomy of lung 09/16/2017   Lens replaced by other means 08/12/2017   Tear film insufficiency 08/12/2017   Vitreous degeneration 08/12/2017   Cervicogenic headache 08/11/2017   DDD (degenerative  disc disease), cervical 08/11/2017   History of lung cancer- right lower lobe s/p right lobectomy 08/06/2017   Lesion of right lung 08/06/2017   Arthritis of facet joint of cervical spine 07/28/2017   Palpitations 03/25/2017   RBBB 07/11/2015   Senile nuclear sclerosis 12/25/2011   Myopia 12/10/2011    PCP: Shelva Majestic, MD REFERRING PROVIDER: Shelva Majestic, MD  REFERRING DIAG: R42 (ICD-10-CM) - Vertigo  THERAPY DIAG:  Dizziness and giddiness  Unsteadiness on feet  ONSET DATE: 03/05/2023 referral  Rationale for Evaluation and Treatment: Rehabilitation  SUBJECTIVE:   SUBJECTIVE STATEMENT: Patient reports continued dizziness. Better yesterday, but worse again today. Did try rolling, L was worse than R. Denies falls.   Pt accompanied by: self (friend drove  patient to clinic)  PERTINENT HISTORY: h/o RLL lung cancer s/p R lobectomy, hypothyroidism, HTN, h/o afib, BPPV, ascending aortic aneurysm, chronic headache, who presents with vertigo  PAIN:  Are you having pain? No  PRECAUTIONS: Fall  PATIENT GOALS: " to not be dizzy"  OBJECTIVE:   DIAGNOSTIC FINDINGS: No recent head imaging  CT Chest W Contrast:  IMPRESSION: 1. Stable postsurgical changes of right lower lobectomy. No evidence of recurrent or metastatic disease in the chest. 2. Stable small right pleural effusion. 3. Stable mildly dilated ascending aorta measuring up to 4.0 cm. 4. Aortic Atherosclerosis (ICD10-I70.0) and Emphysema (ICD10-J43.9).  VESTIBULAR ASSESSMENT:   POSITIONAL TESTING:  Right Roll Test: apogeotropic nystagmus and immediate onset (tested after L roll test) Left Roll Test: apogeotropic nystagmus, symptoms worse on left side, and 7s delay   Left roll test was not fatiguing after waiting it out >1 min, but had a delayed onset, indicative of a potential jam vs cupulolithiasis  VESTIBULAR TREATMENT:                                                                                                   Position testing as noted above today consistent with R side cupulolithiasis given duration and apogeotropic direction.   *Utilized alcohol wipe to nose to help manage nausea  Canalith Repositioning: Kim maneuver- Able to tolerate position 1 and 2 (with vibration applied to R mastoid process in position 1) Unable to tolerate entirety of position 3+  -patient requesting to sit up to become ill   TherAct: -education on repeated rolling, time of meclizine and anti-nausea rx   PATIENT EDUCATION: Education details: exam findings, continue repeated rolling as tolerated, timing of related rx Person educated: Patient Education method: Explanation Education comprehension: verbalized understanding and needs further education  HOME EXERCISE PROGRAM: Repeated  rolling  GOALS: Goals reviewed with patient? Yes  SHORT TERM GOALS: = LTG based on PT POC  LONG TERM GOALS: Target date: 04/15/2023  Pt will be independent with final HEP for improved symptom report  Baseline: to be provided  Goal status: INITIAL  2.  Patient will demonstrate (-) positional testing to indicate resolution of BPPV  Baseline: (+) for nystagmus in L and R [employer] though presentation atypical Goal status: INITIAL  3.  Patient will improve FOTO score to >/= 62  to demonstrate improvement/ resolution of symptoms Baseline: 50 Goal status: INITIAL  ASSESSMENT:  CLINICAL IMPRESSION: Patient seen for skilled PT session with emphasis on canalith repositioning. She continues to present with R horizontal canal cupulolithiasis. However, given slight delay in onset with L roll test, it is possible that patient has more of a jam than true cupulolithiasis. She also continues to be unable to tolerate completion of canalith repositioning maneuvers and may benefit from some form of pre-medication to allow her to complete the maneuvers. Continue POC as able.   OBJECTIVE IMPAIRMENTS: dizziness.   ACTIVITY LIMITATIONS: bending, sleeping, bed mobility, and locomotion level  PARTICIPATION LIMITATIONS: laundry, driving, shopping, community activity, occupation, and yard work  PERSONAL FACTORS: Age, Past/current experiences, Sex, and 3+ comorbidities: see above  are also affecting patient's functional outcome.   REHAB POTENTIAL: Good  CLINICAL DECISION MAKING: Stable/uncomplicated  EVALUATION COMPLEXITY: Low   PLAN:  PT FREQUENCY: 2x/week  PT DURATION: 4 weeks  PLANNED INTERVENTIONS: Therapeutic exercises, Therapeutic activity, Neuromuscular re-education, Balance training, Gait training, Patient/Family education, Self Care, Joint mobilization, Stair training, Vestibular training, Canalith repositioning, Visual/preceptual remediation/compensation, DME instructions, Aquatic  Therapy, Manual therapy, and Re-evaluation  PLAN FOR NEXT SESSION: recheck positonal treat as indicated, R horizontal canal cupulolithiasis? (Gufoni versus head shake and barbaque roll), how is repeated rolling going, recommend use of emesis bag and alochol wipes for nausea    Westley Foots, PT Westley Foots, PT, DPT, CBIS   03/21/2023, 3:43 PM

## 2023-03-23 NOTE — Progress Notes (Unsigned)
Patient ID: Margaret Charles                 DOB: 1951-02-17                      MRN: [id number]     HPI: Margaret Charles is a 72 y.o. female referred by Dr. Durene Cal to HTN clinic. PMH is significant for tobacco abuse (quit 2010), stage Ia non-small cell carcinoma of the lung, postoperative intercostal neuralgia, postoperative atrial fibrillation, arthritis, HTN, RBBB, skin cancer, ascending aortic aneurysm.  At her most recent OP visits her BP has been elevated (154/86, 145/81, 150/89 mmHg). Patient presents today for optimization of her current anti-hypertensive regimen. This patient has no documented history of using any agents that would reduce blood pressure.other than metoprolol.  Current HTN meds: metoprolol tartrate 25 mg BID Previously tried: -- BP goal: <130/80 mmHg  Family History: mother (ACS, aortic aneurysm, HTN), father (HTN), sister (ACS, HTN)  Social History: former tobacco smoker (40 pack-year history)  Diet:   Exercise:   Home BP readings:   Wt Readings from Last 3 Encounters:  12/10/22 143 lb (64.9 kg)  12/05/22 143 lb 12.8 oz (65.2 kg)  07/23/22 141 lb (64 kg)   BP Readings from Last 3 Encounters:  03/18/23 (!) 154/86  03/13/23 (!) 145/81  03/10/23 (!) 150/89   Pulse Readings from Last 3 Encounters:  03/18/23 75  03/13/23 74  03/10/23 73    Renal function: CrCl cannot be calculated (Patient's most recent lab result is older than the maximum 21 days allowed.).  Past Medical History:  Diagnosis Date   A-fib [facility])    post op lung transfer, never have been on medication   Allergy    SEASONAL   Arthritis    ddd   Cataract    BILATERAL,REMOVED   Chronic headaches    imprved recently per patient    Emphysema of lung (HCC)    asymptomatic. noted on CT   Family history of colon cancer    GERD (gastroesophageal reflux disease)    History of adenomatous polyp of colon    2015 precancerous polyp history- due spring 2020.  5 years   History of seizures     1980s few ??? seizure activity  (vagal response)LAST ONE IN THE 1980'S NONE SINCE UPDATED 04/18/22   Hyperlipidemia    Hypertension    Lung cancer (HCC) dx'd 07/2017   Neuromuscular disorder (HCC)    NERVE DAMAGE   Palpitations    RBBB (right bundle branch block)    palpitations   Seizures (HCC)    Skin cancer    skin basal cell cancer   Substance abuse (HCC)    ETOH,ABUSE SOBER FOR 40 YEARS UPDATED 04/18/22   Thyroid disease    HYPO    Current Outpatient Medications on File Prior to Visit  Medication Sig Dispense Refill   acetaminophen (TYLENOL) 325 MG tablet Take 650 mg by mouth every 6 (six) hours as needed for mild pain.     fluticasone (FLONASE) 50 MCG/ACT nasal spray Place 1 spray into both nostrils daily.     levothyroxine (SYNTHROID) 75 MCG tablet TAKE 1 TABLET BY MOUTH EVERY DAY 90 tablet 3   metoprolol tartrate (LOPRESSOR) 25 MG tablet TAKE 1 TABLET BY MOUTH TWICE A DAY 180 tablet 3   Multiple Vitamins-Minerals (CENTRUM ADULTS PO) Take 1 tablet by mouth daily.     Propylene Glycol-Glycerin (SOOTHE OP) Place 1 drop into both eyes  2 (two) times daily as needed (dry eyes).     rosuvastatin (CRESTOR) 5 MG tablet TAKE 1 TABLET BY MOUTH EVERY MONDAY, WEDNESDAY, AND FRIDAY. 36 tablet 4   Current Facility-Administered Medications on File Prior to Visit  Medication Dose Route Frequency Provider Last Rate Last Admin   0.9 %  sodium chloride infusion  500 mL Intravenous Continuous Armbruster, Willaim Rayas, MD        Allergies  Allergen Reactions   Codeine Nausea And Vomiting   Quinolones     Patient was warned about not using Cipro and similar antibiotics. Recent studies have raised concern that fluoroquinolone antibiotics could be associated with an increased risk of aortic aneurysm Fluoroquinolones have non-antimicrobial properties that might jeopardise the integrity of the extracellular matrix of the vascular wall In a  propensity score matched cohort study in [place], there  was a 66% increased rate of aortic aneurysm or dissection associated with oral fluoroquinolone use, compared wit     Assessment/Plan:  1. Hypertension -

## 2023-03-24 ENCOUNTER — Ambulatory Visit: Payer: Medicare PPO | Attending: Internal Medicine | Admitting: Pharmacist

## 2023-03-24 VITALS — BP 124/86 | HR 69

## 2023-03-24 DIAGNOSIS — I10 Essential (primary) hypertension: Secondary | ICD-10-CM

## 2023-03-24 NOTE — Patient Instructions (Addendum)
Your blood pressure goal is < 130/53mmHg   Continue taking your medications as you have been  You are due for annual follow up with Dr Elease Hashimoto in January  Important lifestyle changes to control high blood pressure  Intervention  Effect on the BP   Weight loss Weight loss is one of the most effective lifestyle changes for controlling blood pressure. If you're overweight or obese, losing even a small amount of weight can help reduce blood pressure.    Blood pressure can decrease by 1 millimeter of mercury (mmHg) with each kilogram (about 2.2 pounds) of weight lost.   Exercise regularly As a general goal, aim for 30 minutes of moderate physical activity every day.    Regular physical activity can lower blood pressure by 5 - 8 mmHg.   Eat a healthy diet Eat a diet rich in whole grains, fruits, vegetables, lean meat, and low-fat dairy products. Limit processed foods, saturated fat, and sweets.    A heart-healthy diet can lower high blood pressure by 10 mmHg.   Reduce salt (sodium) in your diet Aim for 000mg  of sodium each day. Avoid deli meats, canned food, and frozen microwave meals which are high in sodium.     Limiting sodium can reduce blood pressure by 5 mmHg.   Limit alcohol One drink equals 12 ounces of beer, 5 ounces of wine, or 1.5 ounces of 80-proof liquor.    Limiting alcohol to < 1 drink a day for women or < 2 drinks a day for men can help lower blood pressure by about 4 mmHg.   To check your pressure at home you will need to:   Sit up in a chair, with feet flat on the floor and back supported. Do not cross your ankles or legs. Rest your left arm so that the cuff is about heart level. If the cuff goes on your upper arm, then just relax your arm on the table, arm of the chair, or your lap. If you have a wrist cuff, hold your wrist against your chest at heart level. Place the cuff snugly around your arm, about 1 inch above the crease of your elbow. The cords should be  inside the groove of your elbow.  Sit quietly, with the cuff in place, for about 5 minutes. Then press the power button to start a reading. Do not talk or move while the reading is taking place.  Record your readings on a sheet of paper. Although most cuffs have a memory, it is often easier to see a pattern developing when the numbers are all in front of you.  You can repeat the reading after 1-3 minutes if it is recommended.   Make sure your bladder is empty and you have not had caffeine or tobacco within the last 30 minutes   Always bring your blood pressure log with you to your appointments. If you have not brought your monitor in to be double checked for accuracy, please bring it to your next appointment.   You can find a list of validated (accurate) blood pressure cuffs at: validatebp.org

## 2023-03-25 ENCOUNTER — Encounter: Payer: Self-pay | Admitting: Physical Therapy

## 2023-03-25 ENCOUNTER — Ambulatory Visit: Payer: Medicare PPO | Attending: Family Medicine | Admitting: Physical Therapy

## 2023-03-25 VITALS — BP 123/76 | HR 70

## 2023-03-25 DIAGNOSIS — R42 Dizziness and giddiness: Secondary | ICD-10-CM | POA: Insufficient documentation

## 2023-03-25 DIAGNOSIS — R2681 Unsteadiness on feet: Secondary | ICD-10-CM | POA: Diagnosis not present

## 2023-03-25 NOTE — Therapy (Signed)
OUTPATIENT PHYSICAL THERAPY VESTIBULAR TREATMENT     Patient Name: Margaret Charles MRN: [id number] DOB:30-Apr-1951, 72 y.o., female Today's Date: 03/25/2023  END OF SESSION:  PT End of Session - 03/25/23 1536     Visit Number 5    Number of Visits 7    Date for PT Re-Evaluation 04/15/23    Authorization Type [employer]    PT Start Time 1535    PT Stop Time 1605    PT Time Calculation (min) 30 min    Equipment Utilized During Treatment Other (comment)   mat level gait belt not indicated   Activity Tolerance Other (comment)   limited due to nausea   Behavior During Therapy Anxious             Past Medical History:  Diagnosis Date   A-fib (HCC)    post op lung transfer, never have been on medication   Allergy    SEASONAL   Arthritis    ddd   Cataract    BILATERAL,REMOVED   Chronic headaches    imprved recently per patient    Emphysema of lung (HCC)    asymptomatic. noted on CT   Family history of colon cancer    GERD (gastroesophageal reflux disease)    History of adenomatous polyp of colon    2015 precancerous polyp history- due spring 2020.  5 years   History of seizures    1980s few ??? seizure activity  (vagal response)LAST ONE IN THE 1980'S NONE SINCE UPDATED 04/18/22   Hyperlipidemia    Hypertension    Lung cancer (HCC) dx'd 07/2017   Neuromuscular disorder (HCC)    NERVE DAMAGE   Palpitations    RBBB (right bundle branch block)    palpitations   Seizures (HCC)    Skin cancer    skin basal cell cancer   Substance abuse (HCC)    ETOH,ABUSE SOBER FOR 40 YEARS UPDATED 04/18/22   Thyroid disease    HYPO   Past Surgical History:  Procedure Laterality Date   BLADDER SURGERY  1985   Long term incontinence. [facility]- used small intestine to build bladder   BREAST BIOPSY  2001   CATARACT EXTRACTION W/ INTRAOCULAR LENS  IMPLANT, BILATERAL     Intestinal blockage  1989 and 1990   bowel obstruction surgery   Lung cancer removal   08/2017   Right lower  lobe   VIDEO ASSISTED THORACOSCOPY (VATS)/WEDGE RESECTION Right 09/16/2017   Procedure: VIDEO ASSISTED THORACOSCOPY (VATS)/LUNG RESECTION;  Surgeon: Delight Ovens, MD;  Location: [facility] OR;  Service: Thoracic;  Laterality: Right;   VIDEO BRONCHOSCOPY N/A 09/16/2017   Procedure: VIDEO BRONCHOSCOPY;  Surgeon: Delight Ovens, MD;  Location: [facility] OR;  Service: Thoracic;  Laterality: N/A;   WRIST SURGERY     de quervains tenosynovitis. Dr. Despina Hick actually did this.    Patient Active Problem List   Diagnosis Date Noted   Aortic atherosclerosis (HCC) 04/02/2021   History of adenomatous polyp of colon 05/11/2019   Hypothyroidism 04/30/2019   Ascending aortic aneurysm (HCC) 08/27/2018   Benign paroxysmal positional vertigo of right ear 05/25/2018   Hypertension 12/29/2017   History of atrial fibrillation 12/29/2017   History of skin cancer 12/29/2017   Alcoholism in remission (HCC) 12/29/2017   Chronic headaches    Emphysema of lung (HCC)    Left thyroid nodule 10/07/2017   S/P lobectomy of lung 09/16/2017   Lens replaced by other means 08/12/2017   Tear film insufficiency 08/12/2017  Vitreous degeneration 08/12/2017   Cervicogenic headache 08/11/2017   DDD (degenerative disc disease), cervical 08/11/2017   History of lung cancer- right lower lobe s/p right lobectomy 08/06/2017   Lesion of right lung 08/06/2017   Arthritis of facet joint of cervical spine 07/28/2017   Palpitations 03/25/2017   RBBB 07/11/2015   Senile nuclear sclerosis 12/25/2011   Myopia 12/10/2011    PCP: Shelva Majestic, MD REFERRING PROVIDER: Shelva Majestic, MD  REFERRING DIAG: R42 (ICD-10-CM) - Vertigo  THERAPY DIAG:  Dizziness and giddiness  Unsteadiness on feet  ONSET DATE: 03/05/2023 referral  Rationale for Evaluation and Treatment: Rehabilitation  SUBJECTIVE:   SUBJECTIVE STATEMENT: Patient reports difficulty performing repeated rolling at home but did try a few times; difficulty  completing due to needing to sit up. Patient followed up with cardiology and thinks largely due to stress with dizziness. Reports taking meclizine 10 min before session. Denies falls.   Pt accompanied by: self (friend drove patient to clinic)  PERTINENT HISTORY: h/o RLL lung cancer s/p R lobectomy, hypothyroidism, HTN, h/o afib, BPPV, ascending aortic aneurysm, chronic headache, who presents with vertigo  PAIN:  Are you having pain? No  PRECAUTIONS: Fall  PATIENT GOALS: " to not be dizzy"  OBJECTIVE:   DIAGNOSTIC FINDINGS: No recent head imaging  CT Chest W Contrast:  IMPRESSION: 1. Stable postsurgical changes of right lower lobectomy. No evidence of recurrent or metastatic disease in the chest. 2. Stable small right pleural effusion. 3. Stable mildly dilated ascending aorta measuring up to 4.0 cm. 4. Aortic Atherosclerosis (ICD10-I70.0) and Emphysema (ICD10-J43.9).  VESTIBULAR ASSESSMENT:   POSITIONAL TESTING:  Right Roll Test: apogeotropic nystagmus and immediate onset (tested after L roll test) Left Roll Test: apogeotropic nystagmus, symptoms worse on left side, and 7s delay   Left roll test was not fatiguing after waiting it out >1 min, but had a delayed onset, indicative of a potential jam vs cupulolithiasis  VESTIBULAR TREATMENT:                                                                                                   *Patient took meclizine 10 minutes before appointment Vitals:   03/25/23 1542  BP: 123/76  Pulse: 70    Position testing remains consistent from prior session with R side cupulolithiasis vs jam given duration and apogeotropic direction.   *Utilized alcohol wipe to nose to help manage nausea  Canalith Repositioning: Gufoni maneuver for apogeotropic cupulothiasis nystagmus (R sidelying with head turn up to the ceiling); attempted to perform 1x - patient able to tolerate ~1 minute in position one and then became to nauseous and had episode of  emesis in emesis bag when brought up in sitting  TherAct: -educated on need for antinausea management if doctor approves in order to continue treatment as patient unable to tolerate treatment needed to resolve symptoms  PATIENT EDUCATION: Education details: exam findings, continue repeated rolling as tolerated, timing of related rx Person educated: Patient Education method: Explanation Education comprehension: verbalized understanding and needs further education  HOME EXERCISE PROGRAM: Repeated rolling  GOALS: Goals reviewed  with patient? Yes  SHORT TERM GOALS: = LTG based on PT POC  LONG TERM GOALS: Target date: 04/15/2023  Pt will be independent with final HEP for improved symptom report  Baseline: to be provided  Goal status: INITIAL  2.  Patient will demonstrate (-) positional testing to indicate resolution of BPPV  Baseline: (+) for nystagmus in L and R [employer] though presentation atypical Goal status: INITIAL  3.  Patient will improve FOTO score to >/= 62 to demonstrate improvement/ resolution of symptoms Baseline: 50 Goal status: INITIAL  ASSESSMENT:  CLINICAL IMPRESSION: Patient seen for skilled PT session with emphasis on canalith repositioning. Patient unable to tolerate any full posiitonal maneuver in today's session. Patient will likely require follow up with PCP for anti-nausea management in order to continue. Continue POC as able.   OBJECTIVE IMPAIRMENTS: dizziness.   ACTIVITY LIMITATIONS: bending, sleeping, bed mobility, and locomotion level  PARTICIPATION LIMITATIONS: laundry, driving, shopping, community activity, occupation, and yard work  PERSONAL FACTORS: Age, Past/current experiences, Sex, and 3+ comorbidities: see above  are also affecting patient's functional outcome.   REHAB POTENTIAL: Good  CLINICAL DECISION MAKING: Stable/uncomplicated  EVALUATION COMPLEXITY: Low   PLAN:  PT FREQUENCY: 2x/week  PT DURATION: 4 weeks  PLANNED  INTERVENTIONS: Therapeutic exercises, Therapeutic activity, Neuromuscular re-education, Balance training, Gait training, Patient/Family education, Self Care, Joint mobilization, Stair training, Vestibular training, Canalith repositioning, Visual/preceptual remediation/compensation, DME instructions, Aquatic Therapy, Manual therapy, and Re-evaluation  PLAN FOR NEXT SESSION: recheck positonal treat as indicated, R horizontal canal cupulolithiasis? (Gufoni versus head shake and barbaque roll), how is repeated rolling going, recommend use of emesis bag and alochol wipes for nausea    Carmelia Bake, PT, DPT   03/25/2023, 4:12 PM

## 2023-03-26 ENCOUNTER — Telehealth: Payer: Self-pay | Admitting: Family Medicine

## 2023-03-26 NOTE — Telephone Encounter (Signed)
Pt states the Neuro office had sent a message about pt needing an anti nausea medicine because she is getting nauseous from her Vertigo. Please advise.

## 2023-03-26 NOTE — Telephone Encounter (Signed)
See below, not sure if a message was sent directly to Dr. Durene Cal but I haven't seen anything regarding this.

## 2023-03-27 ENCOUNTER — Other Ambulatory Visit: Payer: Self-pay | Admitting: Family

## 2023-03-27 ENCOUNTER — Ambulatory Visit: Payer: Medicare PPO

## 2023-03-27 DIAGNOSIS — R2681 Unsteadiness on feet: Secondary | ICD-10-CM

## 2023-03-27 DIAGNOSIS — R42 Dizziness and giddiness: Secondary | ICD-10-CM | POA: Diagnosis not present

## 2023-03-27 MED ORDER — ONDANSETRON HCL 4 MG PO TABS: 4.0000 mg | ORAL_TABLET | Freq: Three times a day (TID) | ORAL | 0 refills | Status: DC | PRN

## 2023-03-27 NOTE — Therapy (Signed)
OUTPATIENT PHYSICAL THERAPY VESTIBULAR TREATMENT     Patient Name: Margaret Charles MRN: [id number] DOB:[birth year], 72 y.o., female Today's Date: 03/28/2023  END OF SESSION:  PT End of Session - 03/27/23 1530     Visit Number 6    Number of Visits 7    Date for PT Re-Evaluation 04/15/23    Authorization Type [employer]    PT Start Time 1528    PT Stop Time 1618    PT Time Calculation (min) 50 min    Activity Tolerance Patient tolerated treatment well    Behavior During Therapy Anxious             Past Medical History:  Diagnosis Date   A-fib (HCC)    post op lung transfer, never have been on medication   Allergy    SEASONAL   Arthritis    ddd   Cataract    BILATERAL,REMOVED   Chronic headaches    imprved recently per patient    Emphysema of lung (HCC)    asymptomatic. noted on CT   Family history of colon cancer    GERD (gastroesophageal reflux disease)    History of adenomatous polyp of colon    2015 precancerous polyp history- due spring 2020.  5 years   History of seizures    1980s few ??? seizure activity  (vagal response)LAST ONE IN THE 1980'S NONE SINCE UPDATED 04/18/22   Hyperlipidemia    Hypertension    Lung cancer (HCC) dx'd 07/2017   Neuromuscular disorder (HCC)    NERVE DAMAGE   Palpitations    RBBB (right bundle branch block)    palpitations   Seizures (HCC)    Skin cancer    skin basal cell cancer   Substance abuse (HCC)    ETOH,ABUSE SOBER FOR 40 YEARS UPDATED 04/18/22   Thyroid disease    HYPO   Past Surgical History:  Procedure Laterality Date   BLADDER SURGERY  1985   Long term incontinence. [facility]- used small intestine to build bladder   BREAST BIOPSY  2001   CATARACT EXTRACTION W/ INTRAOCULAR LENS  IMPLANT, BILATERAL     Intestinal blockage  1989 and 1990   bowel obstruction surgery   Lung cancer removal   08/2017   Right lower lobe   VIDEO ASSISTED THORACOSCOPY (VATS)/WEDGE RESECTION Right 09/16/2017   Procedure: VIDEO  ASSISTED THORACOSCOPY (VATS)/LUNG RESECTION;  Surgeon: Delight Ovens, MD;  Location: [facility] OR;  Service: Thoracic;  Laterality: Right;   VIDEO BRONCHOSCOPY N/A 09/16/2017   Procedure: VIDEO BRONCHOSCOPY;  Surgeon: Delight Ovens, MD;  Location: [facility] OR;  Service: Thoracic;  Laterality: N/A;   WRIST SURGERY     de quervains tenosynovitis. Dr. Despina Hick actually did this.    Patient Active Problem List   Diagnosis Date Noted   Aortic atherosclerosis (HCC) 04/02/2021   History of adenomatous polyp of colon 05/11/2019   Hypothyroidism 04/30/2019   Ascending aortic aneurysm (HCC) 08/27/2018   Benign paroxysmal positional vertigo of right ear 05/25/2018   Hypertension 12/29/2017   History of atrial fibrillation 12/29/2017   History of skin cancer 12/29/2017   Alcoholism in remission (HCC) 12/29/2017   Chronic headaches    Emphysema of lung (HCC)    Left thyroid nodule 10/07/2017   S/P lobectomy of lung 09/16/2017   Lens replaced by other means 08/12/2017   Tear film insufficiency 08/12/2017   Vitreous degeneration 08/12/2017   Cervicogenic headache 08/11/2017   DDD (degenerative disc disease), cervical 08/11/2017  History of lung cancer- right lower lobe s/p right lobectomy 08/06/2017   Lesion of right lung 08/06/2017   Arthritis of facet joint of cervical spine 07/28/2017   Palpitations 03/25/2017   RBBB 07/11/2015   Senile nuclear sclerosis 12/25/2011   Myopia 12/10/2011    PCP: Shelva Majestic, MD REFERRING PROVIDER: Shelva Majestic, MD  REFERRING DIAG: R42 (ICD-10-CM) - Vertigo  THERAPY DIAG:  Dizziness and giddiness  Unsteadiness on feet  ONSET DATE: 03/05/2023 referral  Rationale for Evaluation and Treatment: Rehabilitation  SUBJECTIVE:   SUBJECTIVE STATEMENT: Patient reports she has not been doing the rolling at home. Did get Zofran and took ~1 hour ago. Took meclizine ~5 mins before session. Denies falls.   Pt accompanied by: self (friend drove patient to  clinic)  PERTINENT HISTORY: h/o RLL lung cancer s/p R lobectomy, hypothyroidism, HTN, h/o afib, BPPV, ascending aortic aneurysm, chronic headache, who presents with vertigo  PAIN:  Are you having pain? No  PRECAUTIONS: Fall  PATIENT GOALS: " to not be dizzy"  OBJECTIVE:   DIAGNOSTIC FINDINGS: No recent head imaging  CT Chest W Contrast:  IMPRESSION: 1. Stable postsurgical changes of right lower lobectomy. No evidence of recurrent or metastatic disease in the chest. 2. Stable small right pleural effusion. 3. Stable mildly dilated ascending aorta measuring up to 4.0 cm. 4. Aortic Atherosclerosis (ICD10-I70.0) and Emphysema (ICD10-J43.9).  VESTIBULAR ASSESSMENT:   POSITIONAL TESTING:  Right Dix-Hallpike: upbeating, right nystagmus Right Roll Test: no nystagmus Left Roll Test: apogeotropic nystagmus and immediate onset, fatigued after ~10s    VESTIBULAR TREATMENT:                                                                                                   *Patient took meclizine 5 minutes before appointment -attempted to start Kim maneuver, however when laying flat back- patient with sudden violent onset of "differnet" spinning sensation -after rest, attempted straight supine again, and unable to tolerate  R Gufoni Maneuver: No symptoms in either position and tolerated well   R Epley tolerated with initial position provoking brisk R torsional upbeating nystagmus with immediate onset, but lasting ~5-7s   PATIENT EDUCATION: Education details: exam findings Person educated: Patient Education method: Explanation Education comprehension: verbalized understanding and needs further education  HOME EXERCISE PROGRAM: Repeated rolling- held after visit 10/3  GOALS: Goals reviewed with patient? Yes  SHORT TERM GOALS: = LTG based on PT POC  LONG TERM GOALS: Target date: 04/15/2023  Pt will be independent with final HEP for improved symptom report  Baseline: to be  provided  Goal status: INITIAL  2.  Patient will demonstrate (-) positional testing to indicate resolution of BPPV  Baseline: (+) for nystagmus in L and R [employer] though presentation atypical Goal status: INITIAL  3.  Patient will improve FOTO score to >/= 62 to demonstrate improvement/ resolution of symptoms Baseline: 50 Goal status: INITIAL  ASSESSMENT:  CLINICAL IMPRESSION: Patient seen for skilled PT session with emphasis on canalith repositioning. What was R horizontal canal jam vs cupulolithiasis likely converted to R posterior canal canalithiasis. Treated  with Epley x1, which did result in emesis, but otherwise, patient tolerated well. Continue POC.   OBJECTIVE IMPAIRMENTS: dizziness.   ACTIVITY LIMITATIONS: bending, sleeping, bed mobility, and locomotion level  PARTICIPATION LIMITATIONS: laundry, driving, shopping, community activity, occupation, and yard work  PERSONAL FACTORS: Age, Past/current experiences, Sex, and 3+ comorbidities: see above  are also affecting patient's functional outcome.   REHAB POTENTIAL: Good  CLINICAL DECISION MAKING: Stable/uncomplicated  EVALUATION COMPLEXITY: Low   PLAN:  PT FREQUENCY: 2x/week  PT DURATION: 4 weeks  PLANNED INTERVENTIONS: Therapeutic exercises, Therapeutic activity, Neuromuscular re-education, Balance training, Gait training, Patient/Family education, Self Care, Joint mobilization, Stair training, Vestibular training, Canalith repositioning, Visual/preceptual remediation/compensation, DME instructions, Aquatic Therapy, Manual therapy, and Re-evaluation  PLAN FOR NEXT SESSION: recheck positonal treat as indicated, R horizontal canal cupulolithiasis/ R posterior canal (Gufoni versus head shake and barbaque roll), how is repeated rolling going, recommend use of emesis bag and alochol wipes for nausea    Westley Foots, PT Westley Foots, PT, DPT, CBIS    03/28/2023, 7:44 AM

## 2023-03-31 ENCOUNTER — Ambulatory Visit: Payer: Medicare PPO

## 2023-03-31 DIAGNOSIS — R42 Dizziness and giddiness: Secondary | ICD-10-CM

## 2023-03-31 DIAGNOSIS — R2681 Unsteadiness on feet: Secondary | ICD-10-CM

## 2023-03-31 NOTE — Therapy (Unsigned)
OUTPATIENT PHYSICAL THERAPY VESTIBULAR TREATMENT/ RE-CERT     Patient Name: Margaret Charles MRN: [id number] DOB:May 29, 1951, 72 y.o., female Today's Date: 04/01/2023  END OF SESSION:  PT End of Session - 03/31/23 1533     Visit Number 7    Number of Visits 15   re-cert   Date for PT Re-Evaluation 05/02/23    Authorization Type [employer]    PT Start Time 1531    PT Stop Time 1554    PT Time Calculation (min) 23 min    Activity Tolerance Patient tolerated treatment well    Behavior During Therapy WFL for tasks assessed/performed             Past Medical History:  Diagnosis Date   A-fib (HCC)    post op lung transfer, never have been on medication   Allergy    SEASONAL   Arthritis    ddd   Cataract    BILATERAL,REMOVED   Chronic headaches    imprved recently per patient    Emphysema of lung (HCC)    asymptomatic. noted on CT   Family history of colon cancer    GERD (gastroesophageal reflux disease)    History of adenomatous polyp of colon    2015 precancerous polyp history- due spring 2020.  5 years   History of seizures    1980s few ??? seizure activity  (vagal response)LAST ONE IN THE 1980'S NONE SINCE UPDATED 04/18/22   Hyperlipidemia    Hypertension    Lung cancer (HCC) dx'd 07/2017   Neuromuscular disorder (HCC)    NERVE DAMAGE   Palpitations    RBBB (right bundle branch block)    palpitations   Seizures (HCC)    Skin cancer    skin basal cell cancer   Substance abuse (HCC)    ETOH,ABUSE SOBER FOR 40 YEARS UPDATED 04/18/22   Thyroid disease    HYPO   Past Surgical History:  Procedure Laterality Date   BLADDER SURGERY  1985   Long term incontinence. [facility]- used small intestine to build bladder   BREAST BIOPSY  2001   CATARACT EXTRACTION W/ INTRAOCULAR LENS  IMPLANT, BILATERAL     Intestinal blockage  1989 and 1990   bowel obstruction surgery   Lung cancer removal   08/2017   Right lower lobe   VIDEO ASSISTED THORACOSCOPY (VATS)/WEDGE  RESECTION Right 09/16/2017   Procedure: VIDEO ASSISTED THORACOSCOPY (VATS)/LUNG RESECTION;  Surgeon: Delight Ovens, MD;  Location: [facility] OR;  Service: Thoracic;  Laterality: Right;   VIDEO BRONCHOSCOPY N/A 09/16/2017   Procedure: VIDEO BRONCHOSCOPY;  Surgeon: Delight Ovens, MD;  Location: [facility] OR;  Service: Thoracic;  Laterality: N/A;   WRIST SURGERY     de quervains tenosynovitis. Dr. Despina Hick actually did this.    Patient Active Problem List   Diagnosis Date Noted   Aortic atherosclerosis (HCC) 04/02/2021   History of adenomatous polyp of colon 05/11/2019   Hypothyroidism 04/30/2019   Ascending aortic aneurysm (HCC) 08/27/2018   Benign paroxysmal positional vertigo of right ear 05/25/2018   Hypertension 12/29/2017   History of atrial fibrillation 12/29/2017   History of skin cancer 12/29/2017   Alcoholism in remission (HCC) 12/29/2017   Chronic headaches    Emphysema of lung (HCC)    Left thyroid nodule 10/07/2017   S/P lobectomy of lung 09/16/2017   Lens replaced by other means 08/12/2017   Tear film insufficiency 08/12/2017   Vitreous degeneration 08/12/2017   Cervicogenic headache 08/11/2017   DDD (  degenerative disc disease), cervical 08/11/2017   History of lung cancer- right lower lobe s/p right lobectomy 08/06/2017   Lesion of right lung 08/06/2017   Arthritis of facet joint of cervical spine 07/28/2017   Palpitations 03/25/2017   RBBB 07/11/2015   Senile nuclear sclerosis 12/25/2011   Myopia 12/10/2011    PCP: Shelva Majestic, MD REFERRING PROVIDER: Shelva Majestic, MD  REFERRING DIAG: R42 (ICD-10-CM) - Vertigo  THERAPY DIAG:  Dizziness and giddiness  Unsteadiness on feet  ONSET DATE: 03/05/2023 referral  Rationale for Evaluation and Treatment: Rehabilitation  SUBJECTIVE:   SUBJECTIVE STATEMENT: Patient reports doing better. Walking with a cane today for a "security blanket." Has not had any "spin outs." Does report feeling some motion sensitivity  with bending down and coming up + driving in the car. Last took meclizine on Saturday. Did take zofran before this appt. Denies falls.   Pt accompanied by: self (friend drove patient to clinic)  PERTINENT HISTORY: h/o RLL lung cancer s/p R lobectomy, hypothyroidism, HTN, h/o afib, BPPV, ascending aortic aneurysm, chronic headache, who presents with vertigo  PAIN:  Are you having pain? No  PRECAUTIONS: Fall  PATIENT GOALS: " to not be dizzy"  OBJECTIVE:   DIAGNOSTIC FINDINGS: No recent head imaging  CT Chest W Contrast:  IMPRESSION: 1. Stable postsurgical changes of right lower lobectomy. No evidence of recurrent or metastatic disease in the chest. 2. Stable small right pleural effusion. 3. Stable mildly dilated ascending aorta measuring up to 4.0 cm. 4. Aortic Atherosclerosis (ICD10-I70.0) and Emphysema (ICD10-J43.9).  VESTIBULAR ASSESSMENT:   POSITIONAL TESTING:  Right Dix-Hallpike: no nystagmus Left Dix-Hallpike: no nystagmus Right Roll Test: no nystagmus Left Roll Test: no nystagmus    VESTIBULAR TREATMENT:                                                                                                  -discussed return to work and eval for hypofunction at next appt depending on how week at work goes  PATIENT EDUCATION: Education details: exam findings Person educated: Patient Education method: Explanation Education comprehension: verbalized understanding and needs further education  HOME EXERCISE PROGRAM: Repeated rolling- held after visit 10/3  GOALS: Goals reviewed with patient? Yes  SHORT TERM GOALS: = LTG based on PT POC  LONG TERM GOALS: Target date: 04/15/2023  Pt will be independent with final HEP for improved symptom report  Baseline: to be provided  Goal status: INITIAL  2.  Patient will demonstrate (-) positional testing to indicate resolution of BPPV  Baseline: (+) for nystagmus in L and R [employer] though presentation atypical Goal  status: INITIAL  3.  Patient will improve FOTO score to >/= 62 to demonstrate improvement/ resolution of symptoms Baseline: 50 Goal status: INITIAL    ASSESSMENT:  CLINICAL IMPRESSION: Patient seen for skilled PT session with emphasis on re-check of BPPV. At last session, it had converted from right horizontal canal to R posterior canal. Today, all canals were clear of evidence of BPPV and patient without symptoms. Will re-check at next appt. Continue POC.   OBJECTIVE IMPAIRMENTS: dizziness.  ACTIVITY LIMITATIONS: bending, sleeping, bed mobility, and locomotion level  PARTICIPATION LIMITATIONS: laundry, driving, shopping, community activity, occupation, and yard work  PERSONAL FACTORS: Age, Past/current experiences, Sex, and 3+ comorbidities: see above  are also affecting patient's functional outcome.   REHAB POTENTIAL: Good  CLINICAL DECISION MAKING: Stable/uncomplicated  EVALUATION COMPLEXITY: Low   PLAN:  PT FREQUENCY: 2x/week  PT DURATION: 4 weeks  PLANNED INTERVENTIONS: Therapeutic exercises, Therapeutic activity, Neuromuscular re-education, Balance training, Gait training, Patient/Family education, Self Care, Joint mobilization, Stair training, Vestibular training, Canalith repositioning, Visual/preceptual remediation/compensation, DME instructions, Aquatic Therapy, Manual therapy, and Re-evaluation  PLAN FOR NEXT SESSION: recheck positonal treat as indicated, R horizontal canal cupulolithiasis/ R posterior canal (Gufoni versus head shake and barbaque roll), how is repeated rolling going, recommend use of emesis bag and alochol wipes for nausea    Westley Foots, PT Westley Foots, PT, DPT, CBIS    04/01/2023, 7:55 AM

## 2023-04-03 ENCOUNTER — Ambulatory Visit: Payer: Medicare PPO | Admitting: Podiatry

## 2023-04-03 DIAGNOSIS — M76821 Posterior tibial tendinitis, right leg: Secondary | ICD-10-CM

## 2023-04-03 DIAGNOSIS — M722 Plantar fascial fibromatosis: Secondary | ICD-10-CM | POA: Diagnosis not present

## 2023-04-03 DIAGNOSIS — T148XXA Other injury of unspecified body region, initial encounter: Secondary | ICD-10-CM

## 2023-04-04 ENCOUNTER — Ambulatory Visit: Payer: Medicare PPO

## 2023-04-04 DIAGNOSIS — R42 Dizziness and giddiness: Secondary | ICD-10-CM

## 2023-04-04 DIAGNOSIS — R2681 Unsteadiness on feet: Secondary | ICD-10-CM

## 2023-04-04 NOTE — Therapy (Signed)
OUTPATIENT PHYSICAL THERAPY VESTIBULAR TREATMENT/ RE-CERT     Patient Name: Margaret Charles MRN: [id number] DOB:1950-06-28, 72 y.o., female Today's Date: 04/04/2023  END OF SESSION:  PT End of Session - 04/04/23 1528     Visit Number 8    Number of Visits 15    Date for PT Re-Evaluation 05/02/23    Authorization Type [employer]    PT Start Time 1529    PT Stop Time 1554   BPPV tx   PT Time Calculation (min) 25 min    Activity Tolerance Patient tolerated treatment well    Behavior During Therapy WFL for tasks assessed/performed             Past Medical History:  Diagnosis Date   A-fib (HCC)    post op lung transfer, never have been on medication   Allergy    SEASONAL   Arthritis    ddd   Cataract    BILATERAL,REMOVED   Chronic headaches    imprved recently per patient    Emphysema of lung (HCC)    asymptomatic. noted on CT   Family history of colon cancer    GERD (gastroesophageal reflux disease)    History of adenomatous polyp of colon    2015 precancerous polyp history- due spring 2020.  5 years   History of seizures    1980s few ??? seizure activity  (vagal response)LAST ONE IN THE 1980'S NONE SINCE UPDATED 04/18/22   Hyperlipidemia    Hypertension    Lung cancer (HCC) dx'd 07/2017   Neuromuscular disorder (HCC)    NERVE DAMAGE   Palpitations    RBBB (right bundle branch block)    palpitations   Seizures (HCC)    Skin cancer    skin basal cell cancer   Substance abuse (HCC)    ETOH,ABUSE SOBER FOR 40 YEARS UPDATED 04/18/22   Thyroid disease    HYPO   Past Surgical History:  Procedure Laterality Date   BLADDER SURGERY  1985   Long term incontinence. [facility]- used small intestine to build bladder   BREAST BIOPSY  2001   CATARACT EXTRACTION W/ INTRAOCULAR LENS  IMPLANT, BILATERAL     Intestinal blockage  1989 and 1990   bowel obstruction surgery   Lung cancer removal   08/2017   Right lower lobe   VIDEO ASSISTED THORACOSCOPY (VATS)/WEDGE  RESECTION Right 09/16/2017   Procedure: VIDEO ASSISTED THORACOSCOPY (VATS)/LUNG RESECTION;  Surgeon: Delight Ovens, MD;  Location: [facility] OR;  Service: Thoracic;  Laterality: Right;   VIDEO BRONCHOSCOPY N/A 09/16/2017   Procedure: VIDEO BRONCHOSCOPY;  Surgeon: Delight Ovens, MD;  Location: [facility] OR;  Service: Thoracic;  Laterality: N/A;   WRIST SURGERY     de quervains tenosynovitis. Dr. Despina Hick actually did this.    Patient Active Problem List   Diagnosis Date Noted   Aortic atherosclerosis (HCC) 04/02/2021   History of adenomatous polyp of colon 05/11/2019   Hypothyroidism 04/30/2019   Ascending aortic aneurysm (HCC) 08/27/2018   Benign paroxysmal positional vertigo of right ear 05/25/2018   Hypertension 12/29/2017   History of atrial fibrillation 12/29/2017   History of skin cancer 12/29/2017   Alcoholism in remission (HCC) 12/29/2017   Chronic headaches    Emphysema of lung (HCC)    Left thyroid nodule 10/07/2017   S/P lobectomy of lung 09/16/2017   Lens replaced by other means 08/12/2017   Tear film insufficiency 08/12/2017   Vitreous degeneration 08/12/2017   Cervicogenic headache 08/11/2017  DDD (degenerative disc disease), cervical 08/11/2017   History of lung cancer- right lower lobe s/p right lobectomy 08/06/2017   Lesion of right lung 08/06/2017   Arthritis of facet joint of cervical spine 07/28/2017   Palpitations 03/25/2017   RBBB 07/11/2015   Senile nuclear sclerosis 12/25/2011   Myopia 12/10/2011    PCP: Shelva Majestic, MD REFERRING PROVIDER: Shelva Majestic, MD  REFERRING DIAG: R42 (ICD-10-CM) - Vertigo  THERAPY DIAG:  Dizziness and giddiness  Unsteadiness on feet  ONSET DATE: 03/05/2023 referral  Rationale for Evaluation and Treatment: Rehabilitation  SUBJECTIVE:   SUBJECTIVE STATEMENT: Patient reports doing better. No AD, no CAM boot, but reports that DPM thinks she may have torn a ligament in her foot. Denies falls. Did work this week,  only complaint is fatigue.   Pt accompanied by: self (friend drove patient to clinic)  PERTINENT HISTORY: h/o RLL lung cancer s/p R lobectomy, hypothyroidism, HTN, h/o afib, BPPV, ascending aortic aneurysm, chronic headache, who presents with vertigo  PAIN:  Are you having pain? No  PRECAUTIONS: Fall  PATIENT GOALS: " to not be dizzy"  OBJECTIVE:   DIAGNOSTIC FINDINGS: No recent head imaging  CT Chest W Contrast:  IMPRESSION: 1. Stable postsurgical changes of right lower lobectomy. No evidence of recurrent or metastatic disease in the chest. 2. Stable small right pleural effusion. 3. Stable mildly dilated ascending aorta measuring up to 4.0 cm. 4. Aortic Atherosclerosis (ICD10-I70.0) and Emphysema (ICD10-J43.9).  VESTIBULAR ASSESSMENT:   POSITIONAL TESTING:  Right Dix-Hallpike: no nystagmus Left Dix-Hallpike: no nystagmus Right Roll Test: no nystagmus Left Roll Test: no nystagmus  Vestibular-Ocular Reflex (VOR):  Slow VOR: Normal  VOR Cancellation: Normal  Head-Impulse Test: HIT Right: negative HIT Left: negative   Vestibular Treatment    Habituation:   Brandt-Daroff: comment: 3 reps each side, L side more symptomatic, but improving   PATIENT EDUCATION: Education details: exam findings, PT POC Person educated: Patient Education method: Explanation Education comprehension: verbalized understanding and needs further education  HOME EXERCISE PROGRAM: Repeated rolling- held after visit 10/3 Sit to Side-Lying    Sit on edge of bed. 1. Turn head 45 to right. 2. Maintain head position and lie down slowly on left side. Hold until symptoms subside. 3. Sit up slowly. Hold until symptoms subside. 4. Turn head 45 to left. 5. Maintain head position and lie down slowly on right side. Hold until symptoms subside. 6. Sit up slowly. Repeat sequence ___4_ times per session. Do ___3_ sessions per day. Do all reps to 1 side first.   GOALS: Goals reviewed with patient?  Yes  SHORT TERM GOALS: = LTG based on PT POC  LONG TERM GOALS: Target date: 04/15/2023  Pt will be independent with final HEP for improved symptom report  Baseline: to be provided  Goal status: INITIAL  2.  Patient will demonstrate (-) positional testing to indicate resolution of BPPV  Baseline: (+) for nystagmus in L and R [employer] though presentation atypical Goal status: INITIAL  3.  Patient will improve FOTO score to >/= 62 to demonstrate improvement/ resolution of symptoms Baseline: 50 Goal status: INITIAL    ASSESSMENT:  CLINICAL IMPRESSION: Patient seen for skilled PT session with emphasis on BPPV re-check. She remains with clear BPPV. No hypofunction revealed with further testing. Prescribed brandt daroff for brief motion sensitivity. Continue POC as needed.   OBJECTIVE IMPAIRMENTS: dizziness.   ACTIVITY LIMITATIONS: bending, sleeping, bed mobility, and locomotion level  PARTICIPATION LIMITATIONS: laundry, driving, shopping, community  activity, occupation, and yard work  PERSONAL FACTORS: Age, Past/current experiences, Sex, and 3+ comorbidities: see above  are also affecting patient's functional outcome.   REHAB POTENTIAL: Good  CLINICAL DECISION MAKING: Stable/uncomplicated  EVALUATION COMPLEXITY: Low   PLAN:  PT FREQUENCY: 2x/week  PT DURATION: 4 weeks  PLANNED INTERVENTIONS: Therapeutic exercises, Therapeutic activity, Neuromuscular re-education, Balance training, Gait training, Patient/Family education, Self Care, Joint mobilization, Stair training, Vestibular training, Canalith repositioning, Visual/preceptual remediation/compensation, DME instructions, Aquatic Therapy, Manual therapy, and Re-evaluation  PLAN FOR NEXT SESSION: recheck BPPV PRN   Westley Foots, PT Westley Foots, PT, DPT, CBIS    04/04/2023, 3:57 PM

## 2023-04-04 NOTE — Progress Notes (Signed)
Subjective:   Patient ID: Margaret Charles, female   DOB: 72 y.o.   MRN: [id number]   HPI Patient presents stating heel is still hurting but now getting more pain in the inside of the ankle and that is actually worse and making it hard for her to ambulate   ROS      Objective:  Physical Exam  Neurovascular status intact with patient's right plantar fascia present but improved but there is a lot of swelling and inflammation in the right posterior tibial tendon and she admits that been a big part of the problem from the beginning     Assessment:  Cannot rule out a tear of the posterior tibial tendon given the swelling and the pain that is present and plantar fascial inflammation still present with flatfoot deformity     Plan:  H&P reviewed at great length.  We discussed care and the possibility for repair if necessary.  At this point I am ordering an MRI to try to understand better her underlying pathology and what else may need to be done

## 2023-04-07 ENCOUNTER — Ambulatory Visit (INDEPENDENT_AMBULATORY_CARE_PROVIDER_SITE_OTHER): Payer: Medicare PPO

## 2023-04-07 VITALS — Wt 143.0 lb

## 2023-04-07 DIAGNOSIS — Z0000 Encounter for general adult medical examination without abnormal findings: Secondary | ICD-10-CM | POA: Diagnosis not present

## 2023-04-07 NOTE — Patient Instructions (Signed)
Margaret Charles , Thank you for taking time to come for your Medicare Wellness Visit. I appreciate your ongoing commitment to your health goals. Please review the following plan we discussed and let me know if I can assist you in the future.   Referrals/Orders/Follow-Ups/Clinician Recommendations: Get rid of vertigo and get back to my normal  Aim for 30 minutes of exercise or brisk walking, 6-8 glasses of water, and 5 servings of fruits and vegetables each day.   This is a list of the screening recommended for you and due dates:  Health Maintenance  Topic Date Due   Flu Shot  01/23/2023   COVID-19 Vaccine (6 - 2023-24 season) 02/23/2023   Screening for Lung Cancer  12/03/2023   Medicare Annual Wellness Visit  04/06/2024   Mammogram  09/09/2024   DTaP/Tdap/Td vaccine (2 - Td or Tdap) 09/14/2024   Colon Cancer Screening  05/04/2027   Pneumonia Vaccine  Completed   DEXA scan (bone density measurement)  Completed   Hepatitis C Screening  Completed   Zoster (Shingles) Vaccine  Completed   HPV Vaccine  Aged Out    Advanced directives: (Copy Requested) Please bring a copy of your health care power of attorney and living will to the office to be added to your chart at your convenience.  Next Medicare Annual Wellness Visit scheduled for next year: Yes

## 2023-04-07 NOTE — Progress Notes (Signed)
Subjective:   Margaret Charles is a 72 y.o. female who presents for Medicare Annual (Subsequent) preventive examination.  Visit Complete: Virtual I connected with  Margaret Charles on 04/07/23 by a audio enabled telemedicine application and verified that I am speaking with the correct person using two identifiers.  Because this visit was a virtual/telehealth visit, some criteria may be missing or patient reported. Any vitals not documented were not able to be obtained and vitals that have been documented are patient reported.    Patient Location: Home  Provider Location: Office/Clinic  I discussed the limitations of evaluation and management by telemedicine. The patient expressed understanding and agreed to proceed.  Vital Signs: Because this visit was a virtual/telehealth visit, some criteria may be missing or patient reported. Any vitals not documented were not able to be obtained and vitals that have been documented are patient reported.  Patient Medicare AWV questionnaire was completed by the patient on 04/03/23; I have confirmed that all information answered by patient is correct and no changes since this date.  Cardiac Risk Factors include: advanced age (>14men, >38 women);hypertension;dyslipidemia     Objective:    Today's Vitals   04/07/23 0742  Weight: 143 lb (64.9 kg)   Body mass index is 22.4 kg/m.     04/07/2023    7:50 AM 03/10/2023    4:24 PM 10/05/2022    1:08 PM 03/18/2022    1:08 PM 11/16/2020   12:59 PM 09/14/2019    1:33 PM 06/05/2018   11:55 AM  Advanced Directives  Does Patient Have a Medical Advance Directive? Yes Yes No Yes Yes Yes Yes  Type of Estate agent of [place];Living will Healthcare Power of [place];Living will  Healthcare Power of [place];Living will Healthcare Power of Attorney Living will;Healthcare Power of [employer] Power of [place];Living will  Does patient want to make changes to medical advance directive?     No - Patient declined  No - Patient declined   Copy of Healthcare Power of Attorney in Chart? No - copy requested   Yes - validated most recent copy scanned in chart (See row information) No - copy requested No - copy requested   Would patient like information on creating a medical advance directive?   No - Patient declined        Current Medications (verified) Outpatient Encounter Medications as of 04/07/2023  Medication Sig   acetaminophen (TYLENOL) 325 MG tablet Take 650 mg by mouth every 6 (six) hours as needed for mild pain.   fluticasone (FLONASE) 50 MCG/ACT nasal spray Place 1 spray into both nostrils daily.   levothyroxine (SYNTHROID) 75 MCG tablet TAKE 1 TABLET BY MOUTH EVERY DAY   metoprolol tartrate (LOPRESSOR) 25 MG tablet TAKE 1 TABLET BY MOUTH TWICE A DAY   Multiple Vitamins-Minerals (CENTRUM ADULTS PO) Take 1 tablet by mouth daily.   ondansetron (ZOFRAN) 4 MG tablet Take 1 tablet (4 mg total) by mouth every 8 (eight) hours as needed for nausea or vomiting.   Propylene Glycol-Glycerin (SOOTHE OP) Place 1 drop into both eyes 2 (two) times daily as needed (dry eyes).   rosuvastatin (CRESTOR) 5 MG tablet TAKE 1 TABLET BY MOUTH EVERY MONDAY, WEDNESDAY, AND FRIDAY.   Facility-Administered Encounter Medications as of 04/07/2023  Medication   0.9 %  sodium chloride infusion    Allergies (verified) Codeine and Quinolones   History: Past Medical History:  Diagnosis Date   A-fib (HCC)    post op lung transfer, never  have been on medication   Allergy    SEASONAL   Arthritis    ddd   Cataract    BILATERAL,REMOVED   Chronic headaches    imprved recently per patient    Emphysema of lung (HCC)    asymptomatic. noted on CT   Family history of colon cancer    GERD (gastroesophageal reflux disease)    History of adenomatous polyp of colon    2015 precancerous polyp history- due spring 2020.  5 years   History of seizures    1980s few ??? seizure activity  (vagal  response)LAST ONE IN THE 1980'S NONE SINCE UPDATED 04/18/22   Hyperlipidemia    Hypertension    Lung cancer (HCC) dx'd 07/2017   Neuromuscular disorder (HCC)    NERVE DAMAGE   Palpitations    RBBB (right bundle branch block)    palpitations   Seizures (HCC)    Skin cancer    skin basal cell cancer   Substance abuse (HCC)    ETOH,ABUSE SOBER FOR 40 YEARS UPDATED 04/18/22   Thyroid disease    HYPO   Past Surgical History:  Procedure Laterality Date   BLADDER SURGERY  1985   Long term incontinence. [facility]- used small intestine to build bladder   BREAST BIOPSY  2001   CATARACT EXTRACTION W/ INTRAOCULAR LENS  IMPLANT, BILATERAL     Intestinal blockage  1989 and 1990   bowel obstruction surgery   Lung cancer removal   08/2017   Right lower lobe   VIDEO ASSISTED THORACOSCOPY (VATS)/WEDGE RESECTION Right 09/16/2017   Procedure: VIDEO ASSISTED THORACOSCOPY (VATS)/LUNG RESECTION;  Surgeon: Delight Ovens, MD;  Location: [facility] OR;  Service: Thoracic;  Laterality: Right;   VIDEO BRONCHOSCOPY N/A 09/16/2017   Procedure: VIDEO BRONCHOSCOPY;  Surgeon: Delight Ovens, MD;  Location: [facility] OR;  Service: Thoracic;  Laterality: N/A;   WRIST SURGERY     de quervains tenosynovitis. Dr. Despina Hick actually did this.    Family History  Problem Relation Age of Onset   Dementia Mother    Heart attack Mother        age 64. after hip fracture.    Hypertension Mother    Aortic aneurysm Mother    Prostate cancer Father    Hypertension Father    Colon polyps Father    Breast cancer Sister    Hypertension Sister    Heart attack Sister        91. former smoker   Hypertension Sister    Atrial fibrillation Sister    Aortic aneurysm Sister        died at 74 despite s/p open heart repair   Heart failure Sister    Heart attack Paternal Grandfather    Esophageal cancer Paternal Aunt    Colon cancer Other        uncle, cousin   Colon polyps Other        family history   Crohn's disease Neg Hx    Rectal  cancer Neg Hx    Stomach cancer Neg Hx    Ulcerative colitis Neg Hx    Social History   Socioeconomic History   Marital status: Single    Spouse name: Not on file   Number of children: Not on file   Years of education: Not on file   Highest education level: Not on file  Occupational History   Occupation: Retired Runner, broadcasting/film/video  Tobacco Use   Smoking status: Former    Current packs/day: 0.00  Average packs/day: 1 pack/day for 40.0 years (40.0 ttl pk-yrs)    Types: Cigarettes    Start date: 12/03/1968    Quit date: 12/03/2008    Years since quitting: 14.3    Passive exposure: Past   Smokeless tobacco: Never  Vaping Use   Vaping status: Never Used  Substance and Sexual Activity   Alcohol use: Not Currently    Comment: hx  etoh  38 yrs   Drug use: Never   Sexual activity: Not Currently  Other Topics Concern   Not on file  Social History Narrative   Single. Lives alone.    Lived in [place] for some years to help with parents but otherwise in [place] most of life.       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 [employer]   College Grad- [employer]      [employer] for [employer], a lot of time with friends- book clubs, walking, reading, some gardening   Social Determinants of Health   Financial Resource Strain: Low Risk  (04/03/2023)   Overall Financial Resource Strain (CARDIA)    Difficulty of Paying Living Expenses: Not hard at all  Food Insecurity: No Food Insecurity (04/03/2023)   Hunger Vital Sign    Worried About Running Out of Food in the Last Year: Never true    Ran Out of Food in the Last Year: Never true  Transportation Needs: No Transportation Needs (04/03/2023)   PRAPARE - Administrator, Civil Service (Medical): No    Lack of Transportation (Non-Medical): No  Physical Activity: Sufficiently Active (04/03/2023)   Exercise Vital Sign    Days of Exercise per Week: 3 days    Minutes of Exercise  per Session: 90 min  Stress: No Stress Concern Present (04/03/2023)   [employer] of Occupational Health - Occupational Stress Questionnaire    Feeling of Stress : Only a little  Social Connections: Moderately Integrated (04/03/2023)   Social Connection and Isolation Panel [NHANES]    Frequency of Communication with Friends and Family: More than three times a week    Frequency of Social Gatherings with Friends and Family: Three times a week    Attends Religious Services: More than 4 times per year    Active Member of Clubs or Organizations: Yes    Attends Engineer, structural: More than 4 times per year    Marital Status: Never married    Tobacco Counseling Counseling given: Not Answered   Clinical Intake:  Pre-visit preparation completed: Yes  Pain : No/denies pain     BMI - recorded: 22.4 Nutritional Status: BMI of 19-24  Normal Nutritional Risks: None Diabetes: No  How often do you need to have someone help you when you read instructions, pamphlets, or other written materials from your doctor or pharmacy?: 1 - Never  Interpreter Needed?: No  Information entered by :: Lanier Ensign, LPN   Activities of Daily Living    04/03/2023    9:48 AM  In your present state of health, do you have any difficulty performing the following activities:  Hearing? 0  Vision? 0  Difficulty concentrating or making decisions? 0  Walking or climbing stairs? 0  Dressing or bathing? 0  Doing errands, shopping? 0  Preparing Food and eating ? N  Using the Toilet? N  In the past six months, have you accidently leaked urine? N  Do you have problems with loss of bowel control?  N  Managing your Medications? N  Managing your Finances? N  Housekeeping or managing your Housekeeping? N    Patient Care Team: Shelva Majestic, MD as PCP - General (Family Medicine) Nahser, Deloris Ping, MD as PCP - Cardiology (Cardiology) Delight Ovens, MD (Inactive) as Consulting Physician  (Cardiothoracic Surgery) Si Gaul, MD as Consulting Physician (Oncology) Carlis Abbott Drema Pry, MD as Consulting Physician (Physical Medicine and Rehabilitation) Armbruster, Willaim Rayas, MD as Consulting Physician (Gastroenterology) Elwin Mocha, MD as Consulting Physician (Ophthalmology) Romero Belling, MD (Inactive) as Consulting Physician (Endocrinology)  Indicate any recent Medical Services you may have received from other than [facility] providers in the past year (date may be approximate).     Assessment:   This is a routine wellness examination for Margaret Charles.  Hearing/Vision screen Hearing Screening - Comments:: Pt denies any hearing issues  Vision Screening - Comments:: Pt follows up with dr Sherryll Burger for annual eye exams    Goals Addressed             This Visit's Progress    Patient Stated       To get rid of vertigo and back to my normal        Depression Screen    04/07/2023    7:47 AM 03/18/2022    1:09 PM 04/02/2021    8:15 AM 11/16/2020   12:58 PM 11/18/2019   10:14 AM 10/15/2019    2:23 PM 09/14/2019    1:34 PM  PHQ 2/9 Scores  PHQ - 2 Score 0 0 0 0 0 0 0    Fall Risk    04/03/2023    9:48 AM 03/18/2022    1:12 PM 04/02/2021    8:15 AM 11/16/2020    1:01 PM 11/18/2019   10:14 AM  Fall Risk   Falls in the past year? 1 0 0 1 0  Number falls in past yr: 0 0 0 1   Injury with Fall? 0 0 0 1   Comment    bruised ribs   Risk for fall due to : Impaired vision;Impaired balance/gait No Fall Risks;Impaired vision No Fall Risks    Risk for fall due to: Comment due to vertigo      Follow up Falls prevention discussed Falls prevention discussed Falls evaluation completed Falls prevention discussed     MEDICARE RISK AT HOME: Medicare Risk at Home Any stairs in or around the home?: No If so, are there any without handrails?: No Home free of loose throw rugs in walkways, pet beds, electrical cords, etc?: Yes Adequate lighting in your home to reduce risk of  falls?: Yes Life alert?: No Use of a cane, walker or w/c?: No Grab bars in the bathroom?: Yes Shower chair or bench in shower?: Yes Elevated toilet seat or a handicapped toilet?: No  TIMED UP AND GO:  Was the test performed?  No    Cognitive Function:        04/07/2023    7:51 AM 03/18/2022    1:12 PM 11/16/2020    1:04 PM 09/14/2019    1:33 PM  6CIT Screen  What Year? 0 points 0 points 0 points 0 points  What month? 0 points 0 points 0 points 0 points  What time? 0 points 0 points 0 points 0 points  Count back from 20 0 points 0 points 0 points 0 points  Months in reverse 0 points 0 points 0 points 0 points  Repeat phrase 0 points 0 points  0 points 0 points  Total Score 0 points 0 points 0 points 0 points    Immunizations Immunization History  Administered Date(s) Administered   Fluad Quad(high Dose 65+) 03/19/2017, 03/26/2019, 04/02/2021   Influenza Split 05/09/2011, 04/26/2012, 03/24/2014, 04/22/2014   Influenza Whole 03/19/2017   Influenza, High Dose Seasonal PF 04/24/2016, 03/20/2018, 03/28/2022   Influenza, Seasonal, Injecte, Preservative Fre 04/11/2015   Influenza-Unspecified 03/19/2017, 03/31/2017, 03/24/2020   Moderna Covid-19 Fall Seasonal Vaccine 32yrs & older 12/22/2022   Moderna SARS-COV2 Booster Vaccination 03/23/2021   Moderna Sars-Covid-2 Vaccination 07/26/2019, 08/24/2019, 11/03/2020   PPD Test 09/13/2014   Pfizer Covid-19 Vaccine Bivalent Booster 4yrs & up 03/23/2022   Pneumococcal Conjugate-13 06/15/2015   Pneumococcal Polysaccharide-23 03/19/2017   Respiratory Syncytial Virus Vaccine,Recomb Aduvanted(Arexvy) 06/09/2022   Tdap 09/15/2014   Zoster Recombinant(Shingrix) 03/13/2018, 05/12/2019, 10/26/2019   Zoster, Live 06/11/2013    TDAP status: Up to date  Flu Vaccine status: Due, Education has been provided regarding the importance of this vaccine. Advised may receive this vaccine at local pharmacy or Health Dept. Aware to provide a copy of the  vaccination record if obtained from local pharmacy or Health Dept. Verbalized acceptance and understanding.  Pneumococcal vaccine status: Up to date  Covid-19 vaccine status: Information provided on how to obtain vaccines.   Qualifies for Shingles Vaccine? Yes   Zostavax completed Yes   Shingrix Completed?: Yes  Screening Tests Health Maintenance  Topic Date Due   COVID-19 Vaccine (6 - 2023-24 season) 02/23/2023   INFLUENZA VACCINE  09/22/2023 (Originally 01/23/2023)   Lung Cancer Screening  12/03/2023   Medicare Annual Wellness (AWV)  04/06/2024   MAMMOGRAM  09/09/2024   DTaP/Tdap/Td (2 - Td or Tdap) 09/14/2024   Colonoscopy  05/04/2027   Pneumonia Vaccine 42+ Years old  Completed   DEXA SCAN  Completed   Hepatitis C Screening  Completed   Zoster Vaccines- Shingrix  Completed   HPV VACCINES  Aged Out    Health Maintenance  Health Maintenance Due  Topic Date Due   COVID-19 Vaccine (6 - 2023-24 season) 02/23/2023    Colorectal cancer screening: Type of screening: Colonoscopy. Completed 05/03/22. Repeat every 5 years  Mammogram status: Completed 09/10/22. Repeat every year  Bone Density status: Completed 05/09/22.Marland Kitchen Results reflect: Bone density results: OSTEOPOROSIS. Repeat every 2 years.  Lung Cancer Screening: (Low Dose CT Chest recommended if Age 50-80 years, 20 pack-year currently smoking OR have quit w/in 15years.) does qualify.   Lung Cancer Screening Referral: 12/05/22  Additional Screening:  Hepatitis C Screening: Completed 02/20/16  Vision Screening: Recommended annual ophthalmology exams for early detection of glaucoma and other disorders of the eye. Is the patient up to date with their annual eye exam?  Yes  Who is the provider or what is the name of the office in which the patient attends annual eye exams? Dr Sherryll Burger If pt is not established with a provider, would they like to be referred to a provider to establish care? No .   Dental Screening: Recommended  annual dental exams for proper oral hygiene   Community Resource Referral / Chronic Care Management: CRR required this visit?  No   CCM required this visit?  No     Plan:     I have personally reviewed and noted the following in the patient's chart:   Medical and social history Use of alcohol, tobacco or illicit drugs  Current medications and supplements including opioid prescriptions. Patient is not currently taking opioid prescriptions. Functional ability and  status Nutritional status Physical activity Advanced directives List of other physicians Hospitalizations, surgeries, and ER visits in previous 12 months Vitals Screenings to include cognitive, depression, and falls Referrals and appointments  In addition, I have reviewed and discussed with patient certain preventive protocols, quality metrics, and best practice recommendations. A written personalized care plan for preventive services as well as general preventive health recommendations were provided to patient.     Marzella Schlein, LPN   16/03/9603   After Visit Summary: (MyChart) Due to this being a telephonic visit, the after visit summary with patients personalized plan was offered to patient via MyChart   Nurse Notes: none

## 2023-04-08 ENCOUNTER — Encounter: Payer: Self-pay | Admitting: Podiatry

## 2023-04-10 ENCOUNTER — Encounter: Payer: Self-pay | Admitting: Physical Therapy

## 2023-04-10 ENCOUNTER — Ambulatory Visit: Payer: Medicare PPO | Admitting: Physical Therapy

## 2023-04-10 DIAGNOSIS — R2681 Unsteadiness on feet: Secondary | ICD-10-CM

## 2023-04-10 DIAGNOSIS — R42 Dizziness and giddiness: Secondary | ICD-10-CM | POA: Diagnosis not present

## 2023-04-10 NOTE — Therapy (Signed)
OUTPATIENT PHYSICAL THERAPY VESTIBULAR TREATMENT     Patient Name: Margaret Charles MRN: [id number] DOB:1950/08/26, 72 y.o., female Today's Date: 04/10/2023  END OF SESSION:  PT End of Session - 04/10/23 1536     Visit Number 9    Number of Visits 15    Date for PT Re-Evaluation 05/02/23    Authorization Type [employer]    PT Start Time 1536    PT Stop Time 1600    PT Time Calculation (min) 24 min    Equipment Utilized During Treatment Other (comment)    Activity Tolerance Patient tolerated treatment well    Behavior During Therapy WFL for tasks assessed/performed             Past Medical History:  Diagnosis Date   A-fib (HCC)    post op lung transfer, never have been on medication   Allergy    SEASONAL   Arthritis    ddd   Cataract    BILATERAL,REMOVED   Chronic headaches    imprved recently per patient    Emphysema of lung (HCC)    asymptomatic. noted on CT   Family history of colon cancer    GERD (gastroesophageal reflux disease)    History of adenomatous polyp of colon    2015 precancerous polyp history- due spring 2020.  5 years   History of seizures    1980s few ??? seizure activity  (vagal response)LAST ONE IN THE 1980'S NONE SINCE UPDATED 04/18/22   Hyperlipidemia    Hypertension    Lung cancer (HCC) dx'd 07/2017   Neuromuscular disorder (HCC)    NERVE DAMAGE   Palpitations    RBBB (right bundle branch block)    palpitations   Seizures (HCC)    Skin cancer    skin basal cell cancer   Substance abuse (HCC)    ETOH,ABUSE SOBER FOR 40 YEARS UPDATED 04/18/22   Thyroid disease    HYPO   Past Surgical History:  Procedure Laterality Date   BLADDER SURGERY  1985   Long term incontinence. [facility]- used small intestine to build bladder   BREAST BIOPSY  2001   CATARACT EXTRACTION W/ INTRAOCULAR LENS  IMPLANT, BILATERAL     Intestinal blockage  1989 and 1990   bowel obstruction surgery   Lung cancer removal   08/2017   Right lower lobe   VIDEO  ASSISTED THORACOSCOPY (VATS)/WEDGE RESECTION Right 09/16/2017   Procedure: VIDEO ASSISTED THORACOSCOPY (VATS)/LUNG RESECTION;  Surgeon: Delight Ovens, MD;  Location: [facility] OR;  Service: Thoracic;  Laterality: Right;   VIDEO BRONCHOSCOPY N/A 09/16/2017   Procedure: VIDEO BRONCHOSCOPY;  Surgeon: Delight Ovens, MD;  Location: [facility] OR;  Service: Thoracic;  Laterality: N/A;   WRIST SURGERY     de quervains tenosynovitis. Dr. Despina Hick actually did this.    Patient Active Problem List   Diagnosis Date Noted   Aortic atherosclerosis (HCC) 04/02/2021   History of adenomatous polyp of colon 05/11/2019   Hypothyroidism 04/30/2019   Ascending aortic aneurysm (HCC) 08/27/2018   Benign paroxysmal positional vertigo of right ear 05/25/2018   Hypertension 12/29/2017   History of atrial fibrillation 12/29/2017   History of skin cancer 12/29/2017   Alcoholism in remission (HCC) 12/29/2017   Chronic headaches    Emphysema of lung (HCC)    Left thyroid nodule 10/07/2017   S/P lobectomy of lung 09/16/2017   Lens replaced by other means 08/12/2017   Tear film insufficiency 08/12/2017   Vitreous degeneration 08/12/2017  Cervicogenic headache 08/11/2017   DDD (degenerative disc disease), cervical 08/11/2017   History of lung cancer- right lower lobe s/p right lobectomy 08/06/2017   Lesion of right lung 08/06/2017   Arthritis of facet joint of cervical spine 07/28/2017   Palpitations 03/25/2017   RBBB 07/11/2015   Senile nuclear sclerosis 12/25/2011   Myopia 12/10/2011    PCP: Shelva Majestic, MD REFERRING PROVIDER: Shelva Majestic, MD  REFERRING DIAG: R42 (ICD-10-CM) - Vertigo  THERAPY DIAG:  Dizziness and giddiness  Unsteadiness on feet  ONSET DATE: 03/05/2023 referral  Rationale for Evaluation and Treatment: Rehabilitation  SUBJECTIVE:   SUBJECTIVE STATEMENT: Patient reports that she was able to lay on the R side. Patient reports that when she got up the room was spinning and she  threw up. Patient reports that she feels more wobbly and sick at her stomach when this happens. Patient reports that when she tried her exercise again she got the room spinning. Denies falls/near falls.  Pt accompanied by: self (friend drove patient to clinic)  PERTINENT HISTORY: h/o RLL lung cancer s/p R lobectomy, hypothyroidism, HTN, h/o afib, BPPV, ascending aortic aneurysm, chronic headache, who presents with vertigo  PAIN:  Are you having pain? No  PRECAUTIONS: Fall  PATIENT GOALS: " to not be dizzy"  OBJECTIVE:   DIAGNOSTIC FINDINGS: No recent head imaging  CT Chest W Contrast:  IMPRESSION: 1. Stable postsurgical changes of right lower lobectomy. No evidence of recurrent or metastatic disease in the chest. 2. Stable small right pleural effusion. 3. Stable mildly dilated ascending aorta measuring up to 4.0 cm. 4. Aortic Atherosclerosis (ICD10-I70.0) and Emphysema (ICD10-J43.9).  VESTIBULAR ASSESSMENT:   POSITIONAL TESTING:  Right Dix-Hallpike: upbeating, right nystagmus Right Roll Test: no nystagmus Left Roll Test: no nystagmus  Right Dix Hallpike: ~3 second delay and duration ~5 seconds  VESTIBULAR TREATMENT:  Canalith Repositioning: Epley Right: Number of Reps: 1, Response to Treatment: comment: reported symptoms in position 1 and 3, minor nausea but able to tolerate with use of single alcohol wipe, and Comment: rechecked 2x and no nystagmus indicating resolution of symptoms  TherAct:  Educated on expected feeling post Epley maneuver, recommend holding on sleeping for 1 night given reoccurrence of previous BPPV for extra precaution but educated on importance of not avoiding movements moving forward, educated on types of BPPV and symptoms this session  PATIENT EDUCATION: Education details: exam findings, PT POC Person educated: Patient Education method: Explanation Education comprehension: verbalized understanding and needs further education  HOME EXERCISE  PROGRAM: Repeated rolling- held after visit 10/3 Sit to Side-Lying    Sit on edge of bed. 1. Turn head 45 to right. 2. Maintain head position and lie down slowly on left side. Hold until symptoms subside. 3. Sit up slowly. Hold until symptoms subside. 4. Turn head 45 to left. 5. Maintain head position and lie down slowly on right side. Hold until symptoms subside. 6. Sit up slowly. Repeat sequence ___4_ times per session. Do ___3_ sessions per day. Do all reps to 1 side first.   GOALS: Goals reviewed with patient? Yes  SHORT TERM GOALS: = LTG based on PT POC  LONG TERM GOALS: Target date: 04/15/2023  Pt will be independent with final HEP for improved symptom report  Baseline: to be provided  Goal status: INITIAL  2.  Patient will demonstrate (-) positional testing to indicate resolution of BPPV  Baseline: (+) for nystagmus in L and R [employer] though presentation atypical Goal status: INITIAL  3.  Patient will improve FOTO score to >/= 62 to demonstrate improvement/ resolution of symptoms Baseline: 50 Goal status: INITIAL    ASSESSMENT:  CLINICAL IMPRESSION: Patient seen for skilled PT session with emphasis on BPPV re-check. Patient noted to have re-occurent R posterior canalithiasis. Resolved with one Epley maneuver. Continue POC as needed.   OBJECTIVE IMPAIRMENTS: dizziness.   ACTIVITY LIMITATIONS: bending, sleeping, bed mobility, and locomotion level  PARTICIPATION LIMITATIONS: laundry, driving, shopping, community activity, occupation, and yard work  PERSONAL FACTORS: Age, Past/current experiences, Sex, and 3+ comorbidities: see above  are also affecting patient's functional outcome.   REHAB POTENTIAL: Good  CLINICAL DECISION MAKING: Stable/uncomplicated  EVALUATION COMPLEXITY: Low   PLAN:  PT FREQUENCY: 2x/week  PT DURATION: 4 weeks  PLANNED INTERVENTIONS: Therapeutic exercises, Therapeutic activity, Neuromuscular re-education, Balance training, Gait  training, Patient/Family education, Self Care, Joint mobilization, Stair training, Vestibular training, Canalith repositioning, Visual/preceptual remediation/compensation, DME instructions, Aquatic Therapy, Manual therapy, and Re-evaluation  PLAN FOR NEXT SESSION: recheck BPPV PRN   Carmelia Bake, PT, DTaP  04/10/2023, 4:10 PM

## 2023-04-11 ENCOUNTER — Ambulatory Visit: Payer: Medicare PPO

## 2023-04-11 DIAGNOSIS — R42 Dizziness and giddiness: Secondary | ICD-10-CM

## 2023-04-11 DIAGNOSIS — R2681 Unsteadiness on feet: Secondary | ICD-10-CM | POA: Diagnosis not present

## 2023-04-11 NOTE — Therapy (Signed)
OUTPATIENT PHYSICAL THERAPY VESTIBULAR TREATMENT- 10th VISIT PROGRESS NOTE     Patient Name: Margaret Charles MRN: [id number] DOB:[birth year], 72 y.o., female Today's Date: 04/11/2023  Physical Therapy Progress Note   Dates of Reporting Period:03/10/23-04/11/23  See Note below for Objective Data and Assessment of Progress/Goals.  Thank you for the referral of this patient. Westley Foots, PT, DPT, CBIS   END OF SESSION:  PT End of Session - 04/11/23 1444     Visit Number 10    Number of Visits 15    Date for PT Re-Evaluation 05/02/23    Authorization Type [employer]    PT Start Time 1442    PT Stop Time 1456   BPPV tx   PT Time Calculation (min) 14 min    Activity Tolerance Patient tolerated treatment well    Behavior During Therapy WFL for tasks assessed/performed             Past Medical History:  Diagnosis Date   A-fib (HCC)    post op lung transfer, never have been on medication   Allergy    SEASONAL   Arthritis    ddd   Cataract    BILATERAL,REMOVED   Chronic headaches    imprved recently per patient    Emphysema of lung (HCC)    asymptomatic. noted on CT   Family history of colon cancer    GERD (gastroesophageal reflux disease)    History of adenomatous polyp of colon    2015 precancerous polyp history- due spring 2020.  5 years   History of seizures    1980s few ??? seizure activity  (vagal response)LAST ONE IN THE 1980'S NONE SINCE UPDATED 04/18/22   Hyperlipidemia    Hypertension    Lung cancer (HCC) dx'd 07/2017   Neuromuscular disorder (HCC)    NERVE DAMAGE   Palpitations    RBBB (right bundle branch block)    palpitations   Seizures (HCC)    Skin cancer    skin basal cell cancer   Substance abuse (HCC)    ETOH,ABUSE SOBER FOR 40 YEARS UPDATED 04/18/22   Thyroid disease    HYPO   Past Surgical History:  Procedure Laterality Date   BLADDER SURGERY  1985   Long term incontinence. [facility]- used small intestine to build bladder    BREAST BIOPSY  2001   CATARACT EXTRACTION W/ INTRAOCULAR LENS  IMPLANT, BILATERAL     Intestinal blockage  1989 and 1990   bowel obstruction surgery   Lung cancer removal   08/2017   Right lower lobe   VIDEO ASSISTED THORACOSCOPY (VATS)/WEDGE RESECTION Right 09/16/2017   Procedure: VIDEO ASSISTED THORACOSCOPY (VATS)/LUNG RESECTION;  Surgeon: Delight Ovens, MD;  Location: [facility] OR;  Service: Thoracic;  Laterality: Right;   VIDEO BRONCHOSCOPY N/A 09/16/2017   Procedure: VIDEO BRONCHOSCOPY;  Surgeon: Delight Ovens, MD;  Location: [facility] OR;  Service: Thoracic;  Laterality: N/A;   WRIST SURGERY     de quervains tenosynovitis. Dr. Despina Hick actually did this.    Patient Active Problem List   Diagnosis Date Noted   Aortic atherosclerosis (HCC) 04/02/2021   History of adenomatous polyp of colon 05/11/2019   Hypothyroidism 04/30/2019   Ascending aortic aneurysm (HCC) 08/27/2018   Benign paroxysmal positional vertigo of right ear 05/25/2018   Hypertension 12/29/2017   History of atrial fibrillation 12/29/2017   History of skin cancer 12/29/2017   Alcoholism in remission (HCC) 12/29/2017   Chronic headaches    Emphysema of lung (  HCC)    Left thyroid nodule 10/07/2017   S/P lobectomy of lung 09/16/2017   Lens replaced by other means 08/12/2017   Tear film insufficiency 08/12/2017   Vitreous degeneration 08/12/2017   Cervicogenic headache 08/11/2017   DDD (degenerative disc disease), cervical 08/11/2017   History of lung cancer- right lower lobe s/p right lobectomy 08/06/2017   Lesion of right lung 08/06/2017   Arthritis of facet joint of cervical spine 07/28/2017   Palpitations 03/25/2017   RBBB 07/11/2015   Senile nuclear sclerosis 12/25/2011   Myopia 12/10/2011    PCP: Shelva Majestic, MD REFERRING PROVIDER: Shelva Majestic, MD  REFERRING DIAG: R42 (ICD-10-CM) - Vertigo  THERAPY DIAG:  Dizziness and giddiness  Unsteadiness on feet  ONSET DATE: 03/05/2023  referral  Rationale for Evaluation and Treatment: Rehabilitation  SUBJECTIVE:   SUBJECTIVE STATEMENT: Patient reports doing well. No further episodes of spinning dizziness last night. Slept on her back last night. Feels much better. Denies falls.   Pt accompanied by: self (friend drove patient to clinic)  PERTINENT HISTORY: h/o RLL lung cancer s/p R lobectomy, hypothyroidism, HTN, h/o afib, BPPV, ascending aortic aneurysm, chronic headache, who presents with vertigo  PAIN:  Are you having pain? No  PRECAUTIONS: Fall  PATIENT GOALS: " to not be dizzy"  OBJECTIVE:   DIAGNOSTIC FINDINGS: No recent head imaging  CT Chest W Contrast:  IMPRESSION: 1. Stable postsurgical changes of right lower lobectomy. No evidence of recurrent or metastatic disease in the chest. 2. Stable small right pleural effusion. 3. Stable mildly dilated ascending aorta measuring up to 4.0 cm. 4. Aortic Atherosclerosis (ICD10-I70.0) and Emphysema (ICD10-J43.9).  VESTIBULAR ASSESSMENT:   POSITIONAL TESTING:  Right Dix-Hallpike: no nystagmus   PATIENT EDUCATION: Education details: exam findings, PT POC Person educated: Patient Education method: Explanation Education comprehension: verbalized understanding and needs further education  HOME EXERCISE PROGRAM: Repeated rolling- held after visit 10/3 Sit to Side-Lying    Sit on edge of bed. 1. Turn head 45 to right. 2. Maintain head position and lie down slowly on left side. Hold until symptoms subside. 3. Sit up slowly. Hold until symptoms subside. 4. Turn head 45 to left. 5. Maintain head position and lie down slowly on right side. Hold until symptoms subside. 6. Sit up slowly. Repeat sequence ___4_ times per session. Do ___3_ sessions per day. Do all reps to 1 side first.   GOALS: Goals reviewed with patient? Yes  SHORT TERM GOALS: = LTG based on PT POC  LONG TERM GOALS: Target date: 04/15/2023  Pt will be independent with final HEP for  improved symptom report  Baseline: to be provided  Goal status: INITIAL  2.  Patient will demonstrate (-) positional testing to indicate resolution of BPPV  Baseline: (+) for nystagmus in L and R [employer] though presentation atypical Goal status: INITIAL  3.  Patient will improve FOTO score to >/= 62 to demonstrate improvement/ resolution of symptoms Baseline: 50 Goal status: INITIAL    ASSESSMENT:  CLINICAL IMPRESSION: Patient with resolved R posterior canal canalithiasis at this point in time, but would like an additional appt to re-check as necessary. Continue POC PRN.   OBJECTIVE IMPAIRMENTS: dizziness.   ACTIVITY LIMITATIONS: bending, sleeping, bed mobility, and locomotion level  PARTICIPATION LIMITATIONS: laundry, driving, shopping, community activity, occupation, and yard work  PERSONAL FACTORS: Age, Past/current experiences, Sex, and 3+ comorbidities: see above  are also affecting patient's functional outcome.   REHAB POTENTIAL: Good  CLINICAL DECISION MAKING: Stable/uncomplicated  EVALUATION COMPLEXITY: Low   PLAN:  PT FREQUENCY: 2x/week  PT DURATION: 4 weeks  PLANNED INTERVENTIONS: Therapeutic exercises, Therapeutic activity, Neuromuscular re-education, Balance training, Gait training, Patient/Family education, Self Care, Joint mobilization, Stair training, Vestibular training, Canalith repositioning, Visual/preceptual remediation/compensation, DME instructions, Aquatic Therapy, Manual therapy, and Re-evaluation  PLAN FOR NEXT SESSION: recheck BPPV PRN, check goals and dc?   Westley Foots, PT, DTaP  04/11/2023, 3:01 PM

## 2023-04-13 ENCOUNTER — Ambulatory Visit
Admission: RE | Admit: 2023-04-13 | Discharge: 2023-04-13 | Disposition: A | Discharge status: Home / Self Care | Payer: Medicare PPO | Source: Ambulatory Visit | Attending: Podiatry | Admitting: Podiatry

## 2023-04-13 DIAGNOSIS — M25571 Pain in right ankle and joints of right foot: Secondary | ICD-10-CM | POA: Diagnosis not present

## 2023-04-13 DIAGNOSIS — M19071 Primary osteoarthritis, right ankle and foot: Secondary | ICD-10-CM | POA: Diagnosis not present

## 2023-04-13 DIAGNOSIS — T148XXA Other injury of unspecified body region, initial encounter: Secondary | ICD-10-CM

## 2023-04-13 DIAGNOSIS — M76821 Posterior tibial tendinitis, right leg: Secondary | ICD-10-CM | POA: Diagnosis not present

## 2023-04-13 DIAGNOSIS — M722 Plantar fascial fibromatosis: Secondary | ICD-10-CM

## 2023-04-17 ENCOUNTER — Ambulatory Visit: Payer: Self-pay | Admitting: Physical Therapy

## 2023-04-17 ENCOUNTER — Encounter: Payer: Self-pay | Admitting: Physical Therapy

## 2023-04-17 DIAGNOSIS — R2681 Unsteadiness on feet: Secondary | ICD-10-CM | POA: Diagnosis not present

## 2023-04-17 DIAGNOSIS — R42 Dizziness and giddiness: Secondary | ICD-10-CM

## 2023-04-17 NOTE — Therapy (Signed)
OUTPATIENT PHYSICAL THERAPY VESTIBULAR TREATMENT / DISCHARGE  PHYSICAL THERAPY DISCHARGE SUMMARY  Visits from Start of Care: 11  Current functional level related to goals / functional outcomes: Achieved all LTG   Remaining deficits: None at this time   Education / Equipment: When to return to PT,  continue HEP as needed   Patient agrees to discharge. Patient goals were met. Patient is being discharged due to meeting the stated rehab goals.    Patient Name: Margaret Charles MRN: [id number] DOB:1951-03-07, 72 y.o., female Today's Date: 04/17/2023  Physical Therapy Progress Note   Dates of Reporting Period:03/10/23-04/11/23  See Note below for Objective Data and Assessment of Progress/Goals.  Thank you for the referral of this patient. Westley Foots, PT, DPT, CBIS   END OF SESSION:  PT End of Session - 04/17/23 1535     Visit Number 11    Number of Visits 15    Date for PT Re-Evaluation 05/02/23    Authorization Type [employer]    PT Start Time 1533    PT Stop Time 1555    PT Time Calculation (min) 22 min    Equipment Utilized During Treatment Other (comment)    Activity Tolerance Patient tolerated treatment well    Behavior During Therapy WFL for tasks assessed/performed             Past Medical History:  Diagnosis Date   A-fib (HCC)    post op lung transfer, never have been on medication   Allergy    SEASONAL   Arthritis    ddd   Cataract    BILATERAL,REMOVED   Chronic headaches    imprved recently per patient    Emphysema of lung (HCC)    asymptomatic. noted on CT   Family history of colon cancer    GERD (gastroesophageal reflux disease)    History of adenomatous polyp of colon    2015 precancerous polyp history- due spring 2020.  5 years   History of seizures    1980s few ??? seizure activity  (vagal response)LAST ONE IN THE 1980'S NONE SINCE UPDATED 04/18/22   Hyperlipidemia    Hypertension    Lung cancer (HCC) dx'd 07/2017    Neuromuscular disorder (HCC)    NERVE DAMAGE   Palpitations    RBBB (right bundle branch block)    palpitations   Seizures (HCC)    Skin cancer    skin basal cell cancer   Substance abuse (HCC)    ETOH,ABUSE SOBER FOR 40 YEARS UPDATED 04/18/22   Thyroid disease    HYPO   Past Surgical History:  Procedure Laterality Date   BLADDER SURGERY  1985   Long term incontinence. [facility]- used small intestine to build bladder   BREAST BIOPSY  2001   CATARACT EXTRACTION W/ INTRAOCULAR LENS  IMPLANT, BILATERAL     Intestinal blockage  1989 and 1990   bowel obstruction surgery   Lung cancer removal   08/2017   Right lower lobe   VIDEO ASSISTED THORACOSCOPY (VATS)/WEDGE RESECTION Right 09/16/2017   Procedure: VIDEO ASSISTED THORACOSCOPY (VATS)/LUNG RESECTION;  Surgeon: Delight Ovens, MD;  Location: [facility] OR;  Service: Thoracic;  Laterality: Right;   VIDEO BRONCHOSCOPY N/A 09/16/2017   Procedure: VIDEO BRONCHOSCOPY;  Surgeon: Delight Ovens, MD;  Location: [facility] OR;  Service: Thoracic;  Laterality: N/A;   WRIST SURGERY     de quervains tenosynovitis. Dr. Despina Hick actually did this.    Patient Active Problem List   Diagnosis Date  Noted   Aortic atherosclerosis (HCC) 04/02/2021   History of adenomatous polyp of colon 05/11/2019   Hypothyroidism 04/30/2019   Ascending aortic aneurysm (HCC) 08/27/2018   Benign paroxysmal positional vertigo of right ear 05/25/2018   Hypertension 12/29/2017   History of atrial fibrillation 12/29/2017   History of skin cancer 12/29/2017   Alcoholism in remission (HCC) 12/29/2017   Chronic headaches    Emphysema of lung (HCC)    Left thyroid nodule 10/07/2017   S/P lobectomy of lung 09/16/2017   Lens replaced by other means 08/12/2017   Tear film insufficiency 08/12/2017   Vitreous degeneration 08/12/2017   Cervicogenic headache 08/11/2017   DDD (degenerative disc disease), cervical 08/11/2017   History of lung cancer- right lower lobe s/p right lobectomy  08/06/2017   Lesion of right lung 08/06/2017   Arthritis of facet joint of cervical spine 07/28/2017   Palpitations 03/25/2017   RBBB 07/11/2015   Senile nuclear sclerosis 12/25/2011   Myopia 12/10/2011    PCP: Shelva Majestic, MD REFERRING PROVIDER: Shelva Majestic, MD  REFERRING DIAG: R42 (ICD-10-CM) - Vertigo  THERAPY DIAG:  Dizziness and giddiness  Unsteadiness on feet  ONSET DATE: 03/05/2023 referral  Rationale for Evaluation and Treatment: Rehabilitation  SUBJECTIVE:   SUBJECTIVE STATEMENT: Patient reports doing well. No further episodes of spinning dizziness last night. Slept on her back last night. Feels much better. Denies falls.   Pt accompanied by: self (friend drove patient to clinic)  PERTINENT HISTORY: h/o RLL lung cancer s/p R lobectomy, hypothyroidism, HTN, h/o afib, BPPV, ascending aortic aneurysm, chronic headache, who presents with vertigo  PAIN:  Are you having pain? No  PRECAUTIONS: Fall  PATIENT GOALS: " to not be dizzy"  OBJECTIVE:   DIAGNOSTIC FINDINGS: No recent head imaging  CT Chest W Contrast:  IMPRESSION: 1. Stable postsurgical changes of right lower lobectomy. No evidence of recurrent or metastatic disease in the chest. 2. Stable small right pleural effusion. 3. Stable mildly dilated ascending aorta measuring up to 4.0 cm. 4. Aortic Atherosclerosis (ICD10-I70.0) and Emphysema (ICD10-J43.9).  VESTIBULAR ASSESSMENT:  TherAct (Assess Goals):    POSITIONAL TESTING:  Right Dix-Hallpike: no nystagmus Left Dix-Hallpike: no nystagmus Right Roll Test: no nystagmus Left Roll Test: no nystagmus  FOTO: 70   Discussed progress and plan to continue HEP as needed.   PATIENT EDUCATION: Education details: Continue HEP  Person educated: Patient Education method: Explanation Education comprehension: verbalized understanding  HOME EXERCISE PROGRAM: Repeated rolling- held after visit 10/3 Sit to Side-Lying    Sit on edge of  bed. 1. Turn head 45 to right. 2. Maintain head position and lie down slowly on left side. Hold until symptoms subside. 3. Sit up slowly. Hold until symptoms subside. 4. Turn head 45 to left. 5. Maintain head position and lie down slowly on right side. Hold until symptoms subside. 6. Sit up slowly. Repeat sequence ___4_ times per session. Do ___3_ sessions per day. Do all reps to 1 side first.   GOALS: Goals reviewed with patient? Yes  SHORT TERM GOALS: = LTG based on PT POC  LONG TERM GOALS: Target date: 04/15/2023  Pt will be independent with final HEP for improved symptom report  Baseline: Reports independence in final HEP  Goal status: MET  2.  Patient will demonstrate (-) positional testing to indicate resolution of BPPV  Baseline: (+) for nystagmus in L and R [employer] though presentation atypical, all symptom clear Goal status: MET  3.  Patient will improve  FOTO score to >/= 62 to demonstrate improvement/ resolution of symptoms Baseline: 50; improved to 70 Goal status: MET    ASSESSMENT:  CLINICAL IMPRESSION: Patient discharging from PT today due to achieving all LTG and resolution of positional testing. No further PT services indicated at this time as BPPV has fully resolved. Patient independent in final HEP.   OBJECTIVE IMPAIRMENTS: dizziness.   ACTIVITY LIMITATIONS: bending, sleeping, bed mobility, and locomotion level  PARTICIPATION LIMITATIONS: laundry, driving, shopping, community activity, occupation, and yard work  PERSONAL FACTORS: Age, Past/current experiences, Sex, and 3+ comorbidities: see above  are also affecting patient's functional outcome.   REHAB POTENTIAL: Good  CLINICAL DECISION MAKING: Stable/uncomplicated  EVALUATION COMPLEXITY: Low   PLAN:  Patient D/C with updated HEP  Carmelia Bake, PT, DTP  04/17/2023, 4:19 PM

## 2023-04-27 DIAGNOSIS — Z2989 Encounter for other specified prophylactic measures: Secondary | ICD-10-CM | POA: Diagnosis not present

## 2023-04-27 DIAGNOSIS — Z23 Encounter for immunization: Secondary | ICD-10-CM | POA: Diagnosis not present

## 2023-05-05 ENCOUNTER — Ambulatory Visit (INDEPENDENT_AMBULATORY_CARE_PROVIDER_SITE_OTHER): Payer: Medicare PPO | Admitting: Family Medicine

## 2023-05-05 ENCOUNTER — Encounter: Payer: Self-pay | Admitting: Family Medicine

## 2023-05-05 VITALS — BP 110/64 | HR 67 | Temp 97.9°F | Ht 67.0 in | Wt 142.2 lb

## 2023-05-05 DIAGNOSIS — I7121 Aneurysm of the ascending aorta, without rupture: Secondary | ICD-10-CM | POA: Diagnosis not present

## 2023-05-05 DIAGNOSIS — J439 Emphysema, unspecified: Secondary | ICD-10-CM | POA: Diagnosis not present

## 2023-05-05 DIAGNOSIS — I10 Essential (primary) hypertension: Secondary | ICD-10-CM

## 2023-05-05 DIAGNOSIS — H8110 Benign paroxysmal vertigo, unspecified ear: Secondary | ICD-10-CM | POA: Diagnosis not present

## 2023-05-05 DIAGNOSIS — F1021 Alcohol dependence, in remission: Secondary | ICD-10-CM | POA: Diagnosis not present

## 2023-05-05 DIAGNOSIS — Z0000 Encounter for general adult medical examination without abnormal findings: Secondary | ICD-10-CM | POA: Diagnosis not present

## 2023-05-05 DIAGNOSIS — Z23 Encounter for immunization: Secondary | ICD-10-CM | POA: Diagnosis not present

## 2023-05-05 DIAGNOSIS — E039 Hypothyroidism, unspecified: Secondary | ICD-10-CM

## 2023-05-05 LAB — LIPID PANEL
Cholesterol: 143 mg/dL (ref 0–200)
HDL: 65.4 mg/dL (ref >39.00)
LDL Cholesterol: 64 mg/dL (ref 0–99)
NonHDL: 77.47
Total CHOL/HDL Ratio: 2
Triglycerides: 69 mg/dL (ref 0.0–149.0)
VLDL: 13.8 mg/dL (ref 0.0–40.0)

## 2023-05-05 LAB — COMPREHENSIVE METABOLIC PANEL
ALT: 15 U/L (ref 0–35)
AST: 18 U/L (ref 0–37)
Albumin: 4.1 g/dL (ref 3.5–5.2)
Alkaline Phosphatase: 101 U/L (ref 39–117)
BUN: 16 mg/dL (ref 6–23)
CO2: 28 meq/L (ref 19–32)
Calcium: 9.8 mg/dL (ref 8.4–10.5)
Chloride: 105 meq/L (ref 96–112)
Creatinine, Ser: 0.78 mg/dL (ref 0.40–1.20)
GFR: 75.24 mL/min (ref >60.00)
Glucose, Bld: 89 mg/dL (ref 70–99)
Potassium: 4.5 meq/L (ref 3.5–5.1)
Sodium: 139 meq/L (ref 135–145)
Total Bilirubin: 0.6 mg/dL (ref 0.2–1.2)
Total Protein: 7.1 g/dL (ref 6.0–8.3)

## 2023-05-05 LAB — CBC WITH DIFFERENTIAL/PLATELET
Basophils Absolute: 0 10*3/uL (ref 0.0–0.1)
Basophils Relative: 0.5 % (ref 0.0–3.0)
Eosinophils Absolute: 0.2 10*3/uL (ref 0.0–0.7)
Eosinophils Relative: 1.8 % (ref 0.0–5.0)
HCT: 38.5 % (ref 36.0–46.0)
Hemoglobin: 13.1 g/dL (ref 12.0–15.0)
Lymphocytes Relative: 22.6 % (ref 12.0–46.0)
Lymphs Abs: 2.1 10*3/uL (ref 0.7–4.0)
MCHC: 34 g/dL (ref 30.0–36.0)
MCV: 90 fL (ref 78.0–100.0)
Monocytes Absolute: 0.7 10*3/uL (ref 0.1–1.0)
Monocytes Relative: 7.8 % (ref 3.0–12.0)
Neutro Abs: 6.2 10*3/uL (ref 1.4–7.7)
Neutrophils Relative %: 67.3 % (ref 43.0–77.0)
Platelets: 320 10*3/uL (ref 150.0–400.0)
RBC: 4.28 Mil/uL (ref 3.87–5.11)
RDW: 13.9 % (ref 11.5–15.5)
WBC: 9.3 10*3/uL (ref 4.0–10.5)

## 2023-05-05 LAB — URINALYSIS, ROUTINE W REFLEX MICROSCOPIC
Bilirubin Urine: NEGATIVE
Hgb urine dipstick: NEGATIVE
Ketones, ur: NEGATIVE
Leukocytes,Ua: NEGATIVE
Nitrite: NEGATIVE
Specific Gravity, Urine: 1.015 (ref 1.000–1.030)
Total Protein, Urine: NEGATIVE
Urine Glucose: NEGATIVE
Urobilinogen, UA: 0.2 (ref 0.0–1.0)
pH: 6.5 (ref 5.0–8.0)

## 2023-05-05 LAB — TSH: TSH: 2.22 u[IU]/mL (ref 0.35–5.50)

## 2023-05-05 LAB — VITAMIN D 25 HYDROXY (VIT D DEFICIENCY, FRACTURES): VITD: 34.31 ng/mL (ref 30.00–100.00)

## 2023-05-05 NOTE — Patient Instructions (Addendum)
Prevnar 20 today  Please stop by lab before you go If you have mychart- we will send your results within 3 business days of [place] receiving them.  If you do not have mychart- we will call you about results within 5 business days of [place] receiving them.  *please also note that you will see labs on mychart as soon as they post. I will later go in and write notes on them- will say "notes from Dr. Durene Cal"   Recommended follow up: Return in about 1 year (around 05/04/2024) for physical or sooner if needed.Schedule b4 you leave.

## 2023-05-05 NOTE — Progress Notes (Signed)
Phone [phone]   Subjective:  Patient presents today for their annual physical. Chief complaint-noted.   See problem oriented charting- ROS- full  review of systems was completed and negative Per full ROS sheet completed by patient other than ankle pain and also dealt with prior vertigo with vestibular rehab  The following were reviewed and entered/updated in epic: Past Medical History:  Diagnosis Date   A-fib (HCC)    post op lung transfer, never have been on medication   Allergy    SEASONAL   Arthritis    ddd   Cataract    BILATERAL,REMOVED   Chronic headaches    imprved recently per patient    Emphysema of lung (HCC)    asymptomatic. noted on CT   Family history of colon cancer    GERD (gastroesophageal reflux disease)    History of adenomatous polyp of colon    2015 precancerous polyp history- due spring 2020.  5 years   History of seizures    1980s few ??? seizure activity  (vagal response)LAST ONE IN THE 1980'S NONE SINCE UPDATED 04/18/22   Hyperlipidemia    Hypertension    Lung cancer (HCC) dx'd 07/2017   Neuromuscular disorder (HCC)    NERVE DAMAGE   Palpitations    RBBB (right bundle branch block)    palpitations   Seizures (HCC)    Skin cancer    skin basal cell cancer   Substance abuse (HCC)    ETOH,ABUSE SOBER FOR 40 YEARS UPDATED 04/18/22   Thyroid disease    HYPO   Patient Active Problem List   Diagnosis Date Noted   Ascending aortic aneurysm (HCC) 08/27/2018    Priority: High   History of atrial fibrillation 12/29/2017    Priority: High   Alcoholism in remission (HCC) 12/29/2017    Priority: High   Emphysema of lung (HCC)     Priority: High   S/P lobectomy of lung 09/16/2017    Priority: High   History of lung cancer- right lower lobe s/p right lobectomy 08/06/2017    Priority: High   History of adenomatous polyp of colon 05/11/2019    Priority: Medium    Hypothyroidism 04/30/2019    Priority: Medium    Hypertension 12/29/2017     Priority: Medium    History of skin cancer 12/29/2017    Priority: Medium    Chronic headaches     Priority: Medium    Left thyroid nodule 10/07/2017    Priority: Medium    Palpitations 03/25/2017    Priority: Medium    Benign paroxysmal positional vertigo of right ear 05/25/2018    Priority: Low   Lens replaced by other means 08/12/2017    Priority: Low   Tear film insufficiency 08/12/2017    Priority: Low   Vitreous degeneration 08/12/2017    Priority: Low   Cervicogenic headache 08/11/2017    Priority: Low   DDD (degenerative disc disease), cervical 08/11/2017    Priority: Low   Arthritis of facet joint of cervical spine 07/28/2017    Priority: Low   RBBB 07/11/2015    Priority: Low   Senile nuclear sclerosis 12/25/2011    Priority: Low   Myopia 12/10/2011    Priority: Low   Aortic atherosclerosis (HCC) 04/02/2021    Priority: 2.   Lesion of right lung 08/06/2017   Past Surgical History:  Procedure Laterality Date   BLADDER SURGERY  1985   Long term incontinence. [facility]- used small intestine to build bladder  BREAST BIOPSY  2001   CATARACT EXTRACTION W/ INTRAOCULAR LENS  IMPLANT, BILATERAL     EYE SURGERY  Cataract surgery   Intestinal blockage  1989 and 1990   bowel obstruction surgery   Lung cancer removal   08/2017   Right lower lobe   SMALL INTESTINE SURGERY  1985, 1989, 1990   VIDEO ASSISTED THORACOSCOPY (VATS)/WEDGE RESECTION Right 09/16/2017   Procedure: VIDEO ASSISTED THORACOSCOPY (VATS)/LUNG RESECTION;  Surgeon: Delight Ovens, MD;  Location: [facility] OR;  Service: Thoracic;  Laterality: Right;   VIDEO BRONCHOSCOPY N/A 09/16/2017   Procedure: VIDEO BRONCHOSCOPY;  Surgeon: Delight Ovens, MD;  Location: [facility] OR;  Service: Thoracic;  Laterality: N/A;   WRIST SURGERY     de quervains tenosynovitis. Dr. Despina Hick actually did this.     Family History  Problem Relation Age of Onset   Dementia Mother    Heart attack Mother        age 45. after hip fracture.     Hypertension Mother    Aortic aneurysm Mother    Prostate cancer Father    Hypertension Father    Colon polyps Father    Diabetes Father    Breast cancer Sister    Hypertension Sister    Heart attack Sister        40. former smoker   Asthma Sister    Cancer Sister    Hypertension Sister    Atrial fibrillation Sister    Aortic aneurysm Sister        died at 38 despite s/p open heart repair   Heart failure Sister    COPD Sister    Heart disease Sister    Heart attack Paternal Grandfather    Esophageal cancer Paternal Aunt    Colon cancer Other        uncle, cousin   Colon polyps Other        family history   Crohn's disease Neg Hx    Rectal cancer Neg Hx    Stomach cancer Neg Hx    Ulcerative colitis Neg Hx     Medications- reviewed and updated Current Outpatient Medications  Medication Sig Dispense Refill   acetaminophen (TYLENOL) 325 MG tablet Take 650 mg by mouth every 6 (six) hours as needed for mild pain.     fluticasone (FLONASE) 50 MCG/ACT nasal spray Place 1 spray into both nostrils daily.     levothyroxine (SYNTHROID) 75 MCG tablet TAKE 1 TABLET BY MOUTH EVERY DAY 90 tablet 3   metoprolol tartrate (LOPRESSOR) 25 MG tablet TAKE 1 TABLET BY MOUTH TWICE A DAY 180 tablet 3   Multiple Vitamins-Minerals (CENTRUM ADULTS PO) Take 1 tablet by mouth daily.     Propylene Glycol-Glycerin (SOOTHE OP) Place 1 drop into both eyes 2 (two) times daily as needed (dry eyes).     rosuvastatin (CRESTOR) 5 MG tablet TAKE 1 TABLET BY MOUTH EVERY MONDAY, WEDNESDAY, AND FRIDAY. 36 tablet 4   ondansetron (ZOFRAN) 4 MG tablet Take 1 tablet (4 mg total) by mouth every 8 (eight) hours as needed for nausea or vomiting. (Patient not taking: Reported on 05/05/2023) 20 tablet 0   Current Facility-Administered Medications  Medication Dose Route Frequency Provider Last Rate Last Admin   0.9 %  sodium chloride infusion  500 mL Intravenous Continuous Armbruster, Willaim Rayas, MD         Allergies-reviewed and updated Allergies  Allergen Reactions   Codeine Nausea And Vomiting   Quinolones     Patient  was warned about not using Cipro and similar antibiotics. Recent studies have raised concern that fluoroquinolone antibiotics could be associated with an increased risk of aortic aneurysm Fluoroquinolones have non-antimicrobial properties that might jeopardise the integrity of the extracellular matrix of the vascular wall In a  propensity score matched cohort study in [place], there was a 66% increased rate of aortic aneurysm or dissection associated with oral fluoroquinolone use, compared wit    Social History   Social History Narrative   Single. Lives alone.    Lived in [place] for some years to help with parents but otherwise in [place] most of life.       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 Oropharynx normal Neck: no thyromegaly CV: RRR no murmurs rubs or gallops Lungs: CTAB no crackles, wheeze, rhonchi Abdomen: soft/nontender/nondistended/normal bowel sounds. No rebound or guarding.  Ext: no edema Skin: warm, dry Neuro: grossly normal, moves all extremities, PERRLA  Breasts: normal appearance, no masses or tenderness- other than baseline intercostal neuralgia pain - also noted on upper abdominal exam    Assessment and Plan   72 y.o. female presenting for annual physical.  Health Maintenance counseling: 1. Anticipatory guidance: Patient counseled regarding regular dental exams -q6 months, eye exams - yearly,  avoiding smoking and  second hand smoke , limiting alcohol to 1 beverage per day-doesn't drink at all due to alcohol history- well over 30 years, no illicit drugs .   2. Risk factor reduction:  Advised patient of need for regular exercise and diet rich and fruits and vegetables to reduce risk of heart attack and stroke.  Exercise- water aerobics and walking 3 days a week for 5 miles LAST year- but right now with ankle issues unable to do so  but is able to do her water aerobics some but looking for better brace Diet/weight management-has maintained healthy weight.  Wt Readings from Last 3 Encounters:  05/05/23 142 lb 3.2 oz (64.5 kg)  04/07/23 143 lb (64.9 kg)  12/10/22 143 lb (64.9 kg)  3. Immunizations/screenings/ancillary studies- with lung history could do Prevnar 20, consider updating COVID shot at pharmacy Immunization History  Administered Date(s) Administered   Fluad Quad(high Dose 65+) 03/19/2017, 03/26/2019, 04/02/2021   Influenza Split 05/09/2011, 04/26/2012, 03/24/2014, 04/22/2014   Influenza Whole 03/19/2017   Influenza, High Dose Seasonal PF 04/24/2016, 03/20/2018, 03/28/2022   Influenza, Seasonal, Injecte, Preservative Fre 04/11/2015   Influenza-Unspecified 03/19/2017, 03/31/2017, 03/24/2020   Moderna Covid-19 Fall Seasonal Vaccine 12yrs & older 12/22/2022   Moderna SARS-COV2 Booster Vaccination 03/23/2021   Moderna Sars-Covid-2 Vaccination 07/26/2019, 08/24/2019, 11/03/2020   PPD Test 09/13/2014   Pfizer Covid-19 Vaccine Bivalent Booster 76yrs & up 03/23/2022   Pneumococcal Conjugate-13 06/15/2015   Pneumococcal Polysaccharide-23 03/19/2017   Respiratory Syncytial Virus Vaccine,Recomb Aduvanted(Arexvy) 06/09/2022   Tdap 09/15/2014   Zoster Recombinant(Shingrix) 03/13/2018, 05/12/2019, 10/26/2019   Zoster, Live 06/11/2013  4. Cervical cancer screening- no history of abnormal Pap smear.  Past age based screening recommendations  5. Breast cancer screening- breast exam normal  and mammogram  09/10/22- with sisters history will continue annually 6. Colon cancer screening - Colonoscopy within our system May 03 2022 with plan for 5-year repeat due to history of polyps-she is already scheduled 7. Skin cancer screening- see skin surgery center yearly due to history of skin  cancer. advised regular sunscreen use. Denies worrisome, changing, or new skin lesions.  8. Birth control/STD check-  postmenopausal.  Single/not sexually active 9. Osteoporosis screening at 25- osteopenia 2017 with worst T score -2.0 and lumbar spine.  repeat 04/10/20 with hip score 4.2% and then 04/2022 up to 7.1% risk but density roughly stable - wants to hold off on fosamax but doing calciu/vitm D/weight bearing exercise-repeat DEXA 2025 -Former smoker - quit smoking in 2010-history of lung cancer.  Get urinalysis today  Status of chronic or acute concerns   #MRI ANKLE right pending-  started sometime after plantar fasciitis earlier this year- wearing brace. Didn't tolerate the boot  #History of lung cancer-right lower lobe lobectomy March 2019 continues to follow with Dr. Dorris Fetch and oncology Dr. Arbutus Ped.  Patient was recently seen in June by both and with updated scan.  Repeat scan planned June 2025   # intercostal neuralgia -per pain management in past post surgery- Was referred to neurosurgery in 2023and saw Dr. Lorrine Kin but nothing particularly helped- injection, epidural, peripheral nerve stimulator. Later tried accupuncture and didn't work- ongoing pain unfortunately   #Ascending aortic aneurysm 4.2 cm-plans for annual CT scan- 4.1 cm in 2023 and 4.0 in June 2024.   Follows with Dr. Dorris Fetch and Nahser as well  #Alcoholism in remission (HCC)-congratulated patient on remaining alcohol free - she is doing great  #BENIGN PAROXYSMAL POSITIONAL VERTIGO (BPPV)- will check vitamin D with recent episode as low levels associated with episodes  #hypertension/palpitations S: medication: Metoprolol 25Mg  BID.   No palpitations on metoprolol Home readings #s: reports some variability in blood pressure - was higher with physical therapy  up to 150 but has come back down now but was ok at home BP Readings from Last 3 Encounters:  05/05/23 110/64  03/25/23 123/76  03/24/23 124/86  A/P: stable- continue current medicines   #aortic atherosclerosis with treated hyperlipidemia  S: Medication: rosuvastatin 5 mg three days a week Lab Results  Component Value Date   CHOL 144 04/25/2022   HDL 69.20 04/25/2022   LDLCALC 60 04/25/2022   TRIG 73.0 04/25/2022   CHOLHDL 2 04/25/2022  A/P: monitor lipids- wonder if prior smoking contributed but I think her risk is much lower now- hold off on statin changes unless LDL above 70- continue current medications for now   #hypothyroidism-followed with Dr. Everardo All previously S: compliant On thyroid medication-Synthroid  -[place] every 2 years in past- normal in 2023 Lab Results  Component Value Date   TSH 1.52 01/30/2022  A/P: hopefully stable- update tsh today. Continue current meds for now   #Pulmonary emphysema, unspecified emphysema type (HCC)-patient largely asymptomatic.  Simply noted on imaging- stable  #History of atrial fibrillation-no recurrence after surgery until 1 single episode with travel in march 2024 with little sleep. No anticoagulation was recommend per Dr. Elease Hashimoto  #Chronic nonintractable headache-ongoing issues with neck tension can cause headaches.  Steroid injections in the past were not very helpful. Still with recurrent issues- better now- sleeping on back can triger  # post nasal drip - relief with flonase   Recommended follow up: Return in about 1 year (around 05/04/2024) for  physical or sooner if needed.Schedule b4 you leave. Future Appointments  Date Time Provider Department Center  07/18/2023  4:20 PM Nahser, Deloris Ping, MD [facility]  12/01/2023  9:00 AM [facility] None  12/03/2023  3:30 PM Si Gaul, MD [facility] None  04/12/2024  7:45 AM [facility] ANNUAL WELLNESS VISIT 1 [facility]   Lab/Order associations: fasting   ICD-10-CM   1. Preventative health care  Z00.00     2. Primary hypertension  I10 Comprehensive metabolic panel    CBC with Differential/Platelet    Lipid panel    Urinalysis, Routine w reflex microscopic    3. Acquired hypothyroidism  E03.9 TSH    4. Alcoholism in remission (HCC)  F10.21     5. Aneurysm of ascending aorta without rupture (HCC)  I71.21     6. Pulmonary emphysema, unspecified emphysema type (HCC)  J43.9     7. Benign paroxysmal positional vertigo, unspecified laterality  H81.10 Vitamin D (25 hydroxy)    8. Need for pneumococcal 20-valent conjugate vaccination  Z23 Pneumococcal conjugate vaccine 20-valent (Prevnar 20)      No orders of the defined types were placed in this encounter.   Return precautions advised.  Tana Conch, MD

## 2023-05-09 ENCOUNTER — Encounter: Payer: Self-pay | Admitting: Podiatry

## 2023-05-09 ENCOUNTER — Ambulatory Visit: Payer: Medicare PPO | Admitting: Podiatry

## 2023-05-09 ENCOUNTER — Ambulatory Visit (INDEPENDENT_AMBULATORY_CARE_PROVIDER_SITE_OTHER): Payer: Medicare PPO

## 2023-05-09 DIAGNOSIS — M778 Other enthesopathies, not elsewhere classified: Secondary | ICD-10-CM

## 2023-05-09 DIAGNOSIS — S82892D Other fracture of left lower leg, subsequent encounter for closed fracture with routine healing: Secondary | ICD-10-CM | POA: Diagnosis not present

## 2023-05-11 ENCOUNTER — Encounter: Payer: Self-pay | Admitting: Cardiovascular Disease

## 2023-05-11 DIAGNOSIS — I480 Paroxysmal atrial fibrillation: Secondary | ICD-10-CM

## 2023-05-11 DIAGNOSIS — Z5181 Encounter for therapeutic drug level monitoring: Secondary | ICD-10-CM

## 2023-05-12 ENCOUNTER — Encounter (HOSPITAL_COMMUNITY): Payer: Self-pay

## 2023-05-12 MED ORDER — FLECAINIDE ACETATE 50 MG PO TABS
75.0000 mg | ORAL_TABLET | Freq: Two times a day (BID) | ORAL | 3 refills | Status: DC
Start: 2023-05-12 — End: 2023-07-18

## 2023-05-12 MED ORDER — APIXABAN 5 MG PO TABS
5.0000 mg | ORAL_TABLET | Freq: Two times a day (BID) | ORAL | 3 refills | Status: DC
Start: 2023-05-12 — End: 2024-04-29

## 2023-05-12 NOTE — Progress Notes (Signed)
Subjective:   Patient ID: Margaret Charles, female   DOB: 72 y.o.   MRN: [id number]   HPI Patient presents with pain right foot that seems to be somewhat improved with boot usage still sore and she is walking with a moderate a propulsive gait pattern   ROS      Objective:  Physical Exam  Neurovascular status intact with inflammation still noted around the medial ankle but more on the medial malleolus itself currently     Assessment:  I do think most likely there is been some type of stress reaction of the medial malleolus given the results of her MRI versus any kind of tendon issue     Plan:  H&P reviewed explained and educated the patient we will continue boot continue ice I think this will gradually get better but may take another 6 to 8 weeks.  X-ray reviewed today  X-rays do not indicate reactive external bone change but MRI does show reactive marrow change

## 2023-05-14 ENCOUNTER — Ambulatory Visit (HOSPITAL_COMMUNITY): Payer: Medicare PPO

## 2023-05-14 ENCOUNTER — Encounter: Payer: Self-pay | Admitting: Cardiovascular Disease

## 2023-05-16 ENCOUNTER — Encounter: Payer: Self-pay | Admitting: Cardiovascular Disease

## 2023-05-19 DIAGNOSIS — L309 Dermatitis, unspecified: Secondary | ICD-10-CM | POA: Diagnosis not present

## 2023-05-19 DIAGNOSIS — F1021 Alcohol dependence, in remission: Secondary | ICD-10-CM | POA: Diagnosis not present

## 2023-05-19 DIAGNOSIS — M792 Neuralgia and neuritis, unspecified: Secondary | ICD-10-CM | POA: Diagnosis not present

## 2023-05-19 DIAGNOSIS — D6869 Other thrombophilia: Secondary | ICD-10-CM | POA: Diagnosis not present

## 2023-05-19 DIAGNOSIS — M8580 Other specified disorders of bone density and structure, unspecified site: Secondary | ICD-10-CM | POA: Diagnosis not present

## 2023-05-19 DIAGNOSIS — E039 Hypothyroidism, unspecified: Secondary | ICD-10-CM | POA: Diagnosis not present

## 2023-05-19 DIAGNOSIS — I2510 Atherosclerotic heart disease of native coronary artery without angina pectoris: Secondary | ICD-10-CM | POA: Diagnosis not present

## 2023-05-19 DIAGNOSIS — E785 Hyperlipidemia, unspecified: Secondary | ICD-10-CM | POA: Diagnosis not present

## 2023-05-19 DIAGNOSIS — I10 Essential (primary) hypertension: Secondary | ICD-10-CM | POA: Diagnosis not present

## 2023-05-21 ENCOUNTER — Telehealth: Payer: Self-pay

## 2023-05-21 NOTE — Telephone Encounter (Signed)
Spoke with the patient, detailed instructions given. She stated that she would be here for here test. Asked to call back with any questions. S.Javonna Balli CCT

## 2023-05-27 ENCOUNTER — Encounter (HOSPITAL_COMMUNITY): Payer: Medicare PPO

## 2023-05-27 ENCOUNTER — Encounter (HOSPITAL_COMMUNITY): Payer: Self-pay

## 2023-05-27 ENCOUNTER — Telehealth: Payer: Self-pay | Admitting: Cardiovascular Disease

## 2023-05-27 NOTE — Telephone Encounter (Signed)
Patient is returning call to Margaret Charles in regards to the stress test that was cancelled. Please advise.

## 2023-06-03 ENCOUNTER — Telehealth: Payer: Self-pay

## 2023-06-03 DIAGNOSIS — I480 Paroxysmal atrial fibrillation: Secondary | ICD-10-CM

## 2023-06-03 DIAGNOSIS — Z5181 Encounter for therapeutic drug level monitoring: Secondary | ICD-10-CM

## 2023-06-03 NOTE — Telephone Encounter (Signed)
-----   Message from Kristeen Miss sent at 06/03/2023  2:04 PM EST ----- Regarding: RE: AUTH DENIAL I have discussed with EP.  Her insurance will likely not pay for the exercise myoview Please order a GXT.  ( Plain exercise test )   Will go from there   PN ----- Message ----- From: Francine Graven Sent: 05/26/2023   9:21 AM EST To: Vesta Mixer, MD; Minette Brine; # Subject: Ollen Bowl                                    Good morning,   [employer] has denied pt's auth.  Misty Stanley their appt is tomorrow. Are you able to take them off the schedule until we find a resolve?  Following are the details of denial.  Your request was Denied We've denied the medical services/items listed below requested by you or your doctor or provider:   [phone] - 1 Units - Denied - Nuclear medicine studies of heart muscle at rest and with stress and SPECT Why did we deny your request? We denied the medical services/items listed above because: Per Medical Director review of the applicable medical guidelines, we have determined that your request does not meet medical requirements and, therefore, cannot be approved. Medicare will pay for services that it considers to be reasonable and necessary. Current medical guidelines indicate that this service is considered reasonable and necessary when you meet the following requirement(s): I. Cardiac Radionuclide Imaging (an imaging test that uses a special medication to show how blood goes to the heart at rest and during exercise) will be considered medically necessary when the following are met: A. Myocardial Perfusion Imaging (MPI, a test to show how well blood flows through the heart muscle while under stress) for the following conditions: 1. Chronic ischemic heart disease (ongoing, long term heart weakening caused by reduced blood flow to the heart) - The use of Myocardial Perfusion Imaging (MPI, a test to show how well blood flows through the heart muscle while  under stress) is well established in the diagnosis and management of Coronary Artery Disease (CAD, damage or disease in the heart's major blood vessels) and is covered for these situations: a. Evaluation of suspected or known Coronary Artery Disease (CAD, damage or disease in the heart's major blood vessels) prior to high risk surgical procedure (a surgical procedure puts a patient at risk of heart problems). II. Cardiac Radionuclide Imaging (an imaging test that uses a special medication to show how blood goes to the heart at rest and during exercise) is NOT be considered medically necessary under the following situations: A. Use of pharmacologic agents (medication) in myocardial perfusion imaging (a medical test that shows how well blood is flowing to your heart muscle) unless exercise is not possible. We can't approve your request. You are not having a high risk surgery. Also, we did not see a reason you can't exercise. Prior to making our decision, we did reach out to your physician's office. However, based on the clinical information we received, your request is not medically necessary. You may discuss this with your physician. This decision is based on the following Medicare guidelines: 1. Local Coverage Determination (LCD): Cardiac Radionuclide Imaging (X52841) Revision 21. 2. Title XVIII of the Social Security Act, Section 1862(a)(1)(A). 3. 42 CFR 411.15 - Particular services excluded from coverage.  Thanks,  [employer]

## 2023-06-03 NOTE — Telephone Encounter (Signed)
Called and left patient a detailed message per DPR explaining the changed stress test and that she will be walking. Directions sent via MyChart. Call routed to [facility] for scheduling and MD to sign attestation.

## 2023-06-12 ENCOUNTER — Telehealth: Payer: Self-pay | Admitting: Cardiovascular Disease

## 2023-06-12 DIAGNOSIS — Z85828 Personal history of other malignant neoplasm of skin: Secondary | ICD-10-CM | POA: Diagnosis not present

## 2023-06-12 DIAGNOSIS — Z08 Encounter for follow-up examination after completed treatment for malignant neoplasm: Secondary | ICD-10-CM | POA: Diagnosis not present

## 2023-06-12 DIAGNOSIS — L814 Other melanin hyperpigmentation: Secondary | ICD-10-CM | POA: Diagnosis not present

## 2023-06-12 DIAGNOSIS — L309 Dermatitis, unspecified: Secondary | ICD-10-CM | POA: Diagnosis not present

## 2023-06-12 DIAGNOSIS — D225 Melanocytic nevi of trunk: Secondary | ICD-10-CM | POA: Diagnosis not present

## 2023-06-12 DIAGNOSIS — L570 Actinic keratosis: Secondary | ICD-10-CM | POA: Diagnosis not present

## 2023-06-12 DIAGNOSIS — L821 Other seborrheic keratosis: Secondary | ICD-10-CM | POA: Diagnosis not present

## 2023-06-12 NOTE — Telephone Encounter (Signed)
Spoke with patient to schedule GXT per Dr Elease Hashimoto and she asked if she is to hold her Metoprolol or if there are any restrictions?\\   Please advise.

## 2023-06-13 NOTE — Telephone Encounter (Signed)
Sent patient instructions via MyChart for GXT.

## 2023-06-26 ENCOUNTER — Ambulatory Visit: Payer: Medicare PPO | Attending: Cardiovascular Disease

## 2023-06-26 DIAGNOSIS — Z79899 Other long term (current) drug therapy: Secondary | ICD-10-CM

## 2023-06-26 DIAGNOSIS — Z5181 Encounter for therapeutic drug level monitoring: Secondary | ICD-10-CM | POA: Diagnosis not present

## 2023-06-26 DIAGNOSIS — I480 Paroxysmal atrial fibrillation: Secondary | ICD-10-CM

## 2023-06-26 LAB — EXERCISE TOLERANCE TEST
Angina Index: 0
Duke Treadmill Score: 3
Estimated workload: 4.6
Exercise duration (min): 3 min
Exercise duration (sec): 0 s
MPHR: 147 {beats}/min
Peak HR: 137 {beats}/min
Percent HR: 93 %
RPE: 15
Rest BP: 163105 mm[Hg]
Rest HR: 81 {beats}/min
ST Depression (mm): 0 mm

## 2023-07-06 ENCOUNTER — Other Ambulatory Visit: Payer: Self-pay | Admitting: Cardiovascular Disease

## 2023-07-06 ENCOUNTER — Other Ambulatory Visit: Payer: Self-pay | Admitting: Family Medicine

## 2023-07-06 DIAGNOSIS — I10 Essential (primary) hypertension: Secondary | ICD-10-CM

## 2023-07-06 DIAGNOSIS — E785 Hyperlipidemia, unspecified: Secondary | ICD-10-CM

## 2023-07-06 DIAGNOSIS — I480 Paroxysmal atrial fibrillation: Secondary | ICD-10-CM

## 2023-07-14 DIAGNOSIS — H35371 Puckering of macula, right eye: Secondary | ICD-10-CM | POA: Diagnosis not present

## 2023-07-17 ENCOUNTER — Encounter: Payer: Self-pay | Admitting: Cardiovascular Disease

## 2023-07-17 NOTE — Progress Notes (Signed)
Cardiology Office Note:    Date:  07/18/2023   ID:  Margaret Charles, DOB December 16, 1950, MRN [id number]  PCP:  Shelva Majestic, MD  Cardiologist:  Talvin Christianson   Referring MD: Shelva Majestic, MD   Problem list 1. Paroxysmal atrial fib - found after partial pneumonectomy 2.  Lung nodule 3.  Right bundle branch block   Chief Complaint  Patient presents with   Atrial Fibrillation   Hyperlipidemia       Margaret Charles is a 73 y.o. female with a hx of 1.4 cm lung nodule.  She has a history of right bundle branch block and palpitations.  We are asked to see her for preoperative clearance prior to lung surgery.  She has a RBBB - which she has known about for the past 10 years . She has seen Renaldo in [place]. ( [facility])  Echo at that time showed normal LV function  Was told that she was stable  Developed palpitations a year or so later.   30 day monitr did not show any issues  She had a low dose lung CT scan for screening ( since she was a smoker )  Was found to have a 1.4 cm mass.    deines any chest pain or dyspnea.   Walks 3 miles a day a a good pace .  Able to climb 3 flights of stairs without any problems  Does water aerobics.  BP has been elevated . She eats lots of salt - popcorn every day .  Hx of obesity in her 30s .  Has lost 90 lbs   December 08, 2017  Margaret Charles is seen back today after her Rt lower lobe lobectomy ( for stage 1 squamous cell carcinoma)  She apparently developed atrial fibrillation while in the hospital.  Her blood pressure was also not well controlled.  She was started on amiodarone 200 mg twice a day.  Which was gradually titrated down.  She is now on 200 mg a day and metoprolol   June 10, 2018: Here for follow up  Had PAF after a lobectomy   June 10, 2019: Margaret Charles is seen today for follow-up of her hypertension and paroxysmal atrial fibrillation.  She also has a history of hypothyroidism and is on Synthroid.  Has hx of lung cancer.    Has  also been found to have an aneurism of her ascending aorta.  Echo showed normal LV function .   Could not differentiate whether or not the  Aortic valve was 2 or 3 leaflet. The valve functions as a 3 leaflet valve so it is likely a normal 3 leaflet valve.    Jan. 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   Has very brief episodes of palpitations Her apple watch has recorded a HR of 135 for perhaps 30 mintues  Encouraged her to record an ECG on her watch   She walks regularly ,   walked 5 miles this am   Jul 23, 2022 Margaret Charles is seen for follow up of her HTN, PAF, COPD, ascending aortic aneurism 4 cm.   She gets yearly CT scans and has been followed by [facility]  Dorris Fetch )  Has not had any PAF  Walked 5 miles a day  Still working   Had her 1st episode of COVID this past December .  Took Paxlovid, had a rebound episoe of COVID    Jan. 2025 Margaret Charles is seen for follow up of her HTN, PAF, COPD  , lung cancer s/p partial lobectomy Ascending aortic aneurism of 4.0 cm Followed by dr. Dorris Fetch   Is feeling well  No episodes of atrial fib   Gets some exercise      Past Medical History:  Diagnosis Date   A-fib [facility])    post op lung transfer, never have been on medication   Allergy    SEASONAL   Arthritis    ddd   Cataract    BILATERAL,REMOVED   Chronic headaches    imprved recently per patient    Emphysema of lung (HCC)    asymptomatic. noted on CT   Family history of colon cancer    GERD (gastroesophageal reflux disease)    History of adenomatous polyp of colon    2015 precancerous polyp history- due spring 2020.  5 years   History of seizures    1980s few ??? seizure activity  (vagal response)LAST ONE IN THE 1980'S NONE  SINCE UPDATED 04/18/22   Hyperlipidemia    Hypertension    Lung cancer (HCC) dx'd 07/2017   Neuromuscular disorder (HCC)    NERVE DAMAGE   Palpitations    RBBB (right bundle branch block)    palpitations   Seizures (HCC)    Skin cancer    skin basal cell cancer   Substance abuse (HCC)    ETOH,ABUSE SOBER FOR 40 YEARS UPDATED 04/18/22   Thyroid disease    HYPO    Past Surgical History:  Procedure Laterality Date   BLADDER SURGERY  1985   Long term incontinence. [facility]- used small intestine to build bladder   BREAST BIOPSY  2001   CATARACT EXTRACTION W/ INTRAOCULAR LENS  IMPLANT, BILATERAL     EYE SURGERY  Cataract surgery   Intestinal blockage  1989 and 1990   bowel obstruction surgery   Lung cancer removal   08/2017   Right lower lobe   SMALL INTESTINE SURGERY  1985, 1989, 1990   VIDEO ASSISTED THORACOSCOPY (VATS)/WEDGE RESECTION Right 09/16/2017   Procedure: VIDEO ASSISTED THORACOSCOPY (VATS)/LUNG RESECTION;  Surgeon: Delight Ovens, MD;  Location[facility] OR;  Service: Thoracic;  Laterality: Right;   VIDEO BRONCHOSCOPY N/A 09/16/2017   Procedure: VIDEO BRONCHOSCOPY;  Surgeon: Delight Ovens, MD;  Location: [facility] OR;  Service: Thoracic;  Laterality: N/A;   WRIST SURGERY     de quervains tenosynovitis. Dr. Despina Hick actually did this.     Current Medications: Current Meds  Medication Sig   acetaminophen (TYLENOL) 325 MG tablet Take 650 mg by mouth every 6 (six) hours as needed for mild pain.   apixaban (ELIQUIS) 5 MG TABS tablet Take 1 tablet (5 mg total) by mouth 2 (two) times daily.   flecainide (TAMBOCOR) 150 MG tablet Take by mouth.   fluticasone (CUTIVATE) 0.05 % cream Apply topically.   fluticasone (FLONASE) 50 MCG/ACT nasal spray Place 1 spray into both nostrils daily.   ketoconazole (NIZORAL) 2 % cream Apply topically 2 (two) times daily as needed.   levothyroxine (SYNTHROID) 75 MCG tablet TAKE 1 TABLET BY MOUTH EVERY DAY   metoprolol tartrate (LOPRESSOR) 25 MG  tablet TAKE 1 TABLET BY MOUTH TWICE A DAY   Multiple Vitamins-Minerals (CENTRUM ADULTS PO)  Take 1 tablet by mouth daily.   Propylene Glycol-Glycerin (SOOTHE OP) Place 1 drop into both eyes 2 (two) times daily as needed (dry eyes).   rosuvastatin (CRESTOR) 5 MG tablet TAKE 1 TABLET BY MOUTH EVERY MONDAY, WEDNESDAY, AND FRIDAY   Current Facility-Administered Medications for the 07/18/23 encounter (Office Visit) with Cyndal Kasson, Deloris Ping, MD  Medication   0.9 %  sodium chloride infusion     Allergies:   Codeine and Quinolones   Social History   Socioeconomic History   Marital status: Single    Spouse name: Not on file   Number of children: Not on file   Years of education: Not on file   Highest education level: Not on file  Occupational History   Occupation: Retired Runner, broadcasting/film/video  Tobacco Use   Smoking status: Former    Current packs/day: 0.00    Average packs/day: 1 pack/day for 40.0 years (40.0 ttl pk-yrs)    Types: Cigarettes    Start date: 12/03/1968    Quit date: 12/03/2008    Years since quitting: 14.6    Passive exposure: Past   Smokeless tobacco: Never  Vaping Use   Vaping status: Never Used  Substance and Sexual Activity   Alcohol use: Not Currently    Comment: hx  etoh  38 yrs   Drug use: Never   Sexual activity: Not Currently    Birth control/protection: None  Other Topics Concern   Not on file  Social History Narrative   Single. Lives alone.    Lived in [place] for some years to help with parents but otherwise in [place] most of life.       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 [employer]   College Grad- [employer]      [employer] for [employer], a lot of time with friends- book clubs, walking, reading, some gardening   Social Drivers of Corporate investment banker Strain: Low Risk  (04/03/2023)   Overall Financial Resource Strain (CARDIA)    Difficulty of Paying Living Expenses: Not hard at all  Food  Insecurity: No Food Insecurity (04/03/2023)   Hunger Vital Sign    Worried About Running Out of Food in the Last Year: Never true    Ran Out of Food in the Last Year: Never true  Transportation Needs: No Transportation Needs (04/03/2023)   PRAPARE - Administrator, Civil Service (Medical): No    Lack of Transportation (Non-Medical): No  Physical Activity: Sufficiently Active (04/03/2023)   Exercise Vital Sign    Days of Exercise per Week: 3 days    Minutes of Exercise per Session: 90 min  Stress: No Stress Concern Present (04/03/2023)   [employer] of Occupational Health - Occupational Stress Questionnaire    Feeling of Stress : Only a little  Social Connections: Moderately Integrated (04/03/2023)   Social Connection and Isolation Panel [NHANES]    Frequency of Communication with Friends and Family: More than three times a week    Frequency of Social Gatherings with Friends and Family: Three times a week    Attends Religious Services: More than 4 times per year    Active Member of Clubs or Organizations: Yes    Attends Engineer, structural: More than 4 times per year    Marital Status: Never married     Family History: The patient's family history includes Aortic aneurysm in her mother and sister; Asthma in her sister;  Atrial fibrillation in her sister; Breast cancer in her sister; COPD in her sister; Cancer in her sister; Colon cancer in an other family member; Colon polyps in her father and another family member; Dementia in her mother; Diabetes in her father; Esophageal cancer in her paternal aunt; Heart attack in her mother, paternal grandfather, and sister; Heart disease in her sister; Heart failure in her sister; Hypertension in her father, mother, sister, and sister; Prostate cancer in her father. There is no history of Crohn's disease, Rectal cancer, Stomach cancer, or Ulcerative colitis.  ROS:   Please see the history of present illness.     All other  systems reviewed and are negative.  EKGs/Labs/Other Studies Reviewed:    The following studies were reviewed today: GXT for flecainide testing      Recent Labs: 10/05/2022: Magnesium 1.9 05/05/2023: ALT 15; BUN 16; Creatinine, Ser 0.78; Hemoglobin 13.1; Platelets 320.0; Potassium 4.5; Sodium 139; TSH 2.22  Recent Lipid Panel    Component Value Date/Time   CHOL 143 05/05/2023 0905   CHOL 147 10/02/2020 0913   TRIG 69.0 05/05/2023 0905   HDL 65.40 05/05/2023 0905   HDL 66 10/02/2020 0913   CHOLHDL 2 05/05/2023 0905   VLDL 13.8 05/05/2023 0905   LDLCALC 64 05/05/2023 0905   LDLCALC 67 10/02/2020 0913   LDLCALC 100 (H) 03/30/2020 0920     Physical Exam: Blood pressure 134/74, pulse 62, height 5\' 7"  (1.702 m), weight 144 lb 3.2 oz (65.4 kg), SpO2 98%.       GEN:  Well nourished, well developed in no acute distress HEENT: Normal NECK: No JVD; No carotid bruits LYMPHATICS: No lymphadenopathy CARDIAC: RRR , no murmurs, rubs, gallops RESPIRATORY:  Clear to auscultation without rales, wheezing or rhonchi  ABDOMEN: Soft, non-tender, non-distended MUSCULOSKELETAL:  No edema; No deformity  SKIN: Warm and dry NEUROLOGIC:  Alert and oriented x 3   EKG:          Marland Kitchen   ASSESSMENT:  Plan      1.  Paroxysmal atrial fibrillation:   She is on flecainide and metoprolol and Eliquis.  Continue these medications.    2.  Essential hypertension:   Blood pressure is well-controlled.  3.  Dilated ascending aorta: She has a 4 cm fusiform aortic aneurysm.     Followed by by [facility]      Medication Adjustments/Labs and Tests Ordered: Current medicines are reviewed at length with the patient today.  Concerns regarding medicines are outlined above.  No orders of the defined types were placed in this encounter.  No orders of the defined types were placed in this encounter.   Signed, Kristeen Miss, MD  07/18/2023 5:03 PM    [facility]

## 2023-07-18 ENCOUNTER — Ambulatory Visit: Payer: Medicare PPO | Attending: Cardiovascular Disease | Admitting: Cardiovascular Disease

## 2023-07-18 ENCOUNTER — Encounter: Payer: Self-pay | Admitting: Cardiovascular Disease

## 2023-07-18 VITALS — BP 134/74 | HR 62 | Ht 67.0 in | Wt 144.2 lb

## 2023-07-18 DIAGNOSIS — I480 Paroxysmal atrial fibrillation: Secondary | ICD-10-CM

## 2023-07-18 NOTE — Patient Instructions (Signed)
Follow-Up: At [facility], you and your health needs are our priority.  As part of our continuing mission to provide you with exceptional heart care, we have created designated Provider Care Teams.  These Care Teams include your primary Cardiologist (physician) and Advanced Practice Providers (APPs -  Physician Assistants and Nurse Practitioners) who all work together to provide you with the care you need, when you need it.  We recommend signing up for the patient portal called "MyChart".  Sign up information is provided on this After Visit Summary.  MyChart is used to connect with patients for Virtual Visits (Telemedicine).  Patients are able to view lab/test results, encounter notes, upcoming appointments, etc.  Non-urgent messages can be sent to your provider as well.   To learn more about what you can do with MyChart, go to [email or web address].    Your next appointment:   1 year(s)  Provider:   Dr. Anne Fu  Other Instructions   1st Floor: - Lobby - Registration  - Pharmacy  - Lab - Cafe  2nd Floor: - PV Lab - Diagnostic Testing (echo, CT, nuclear med)  3rd Floor: - Vacant  4th Floor: - [facility] (cardiothoracic surgery) - AFib Clinic - Structural Heart Clinic - Vascular Surgery  - Vascular Ultrasound  5th Floor: - HeartCare Cardiology (general and EP) - Clinical Pharmacy for coumadin, hypertension, lipid, weight-loss medications, and med management appointments    Valet parking services will be available as well.

## 2023-07-25 DIAGNOSIS — C44622 Squamous cell carcinoma of skin of right upper limb, including shoulder: Secondary | ICD-10-CM | POA: Diagnosis not present

## 2023-07-25 DIAGNOSIS — K130 Diseases of lips: Secondary | ICD-10-CM | POA: Diagnosis not present

## 2023-07-25 DIAGNOSIS — D485 Neoplasm of uncertain behavior of skin: Secondary | ICD-10-CM | POA: Diagnosis not present

## 2023-08-15 DIAGNOSIS — C44622 Squamous cell carcinoma of skin of right upper limb, including shoulder: Secondary | ICD-10-CM | POA: Diagnosis not present

## 2023-09-16 DIAGNOSIS — Z1231 Encounter for screening mammogram for malignant neoplasm of breast: Secondary | ICD-10-CM | POA: Diagnosis not present

## 2023-09-16 LAB — HM MAMMOGRAPHY

## 2023-09-18 ENCOUNTER — Encounter: Payer: Self-pay | Admitting: Family Medicine

## 2023-11-06 ENCOUNTER — Other Ambulatory Visit: Payer: Self-pay | Admitting: Cardiovascular Disease

## 2023-11-06 DIAGNOSIS — E785 Hyperlipidemia, unspecified: Secondary | ICD-10-CM

## 2023-11-06 DIAGNOSIS — I10 Essential (primary) hypertension: Secondary | ICD-10-CM

## 2023-11-06 DIAGNOSIS — I480 Paroxysmal atrial fibrillation: Secondary | ICD-10-CM

## 2023-11-20 ENCOUNTER — Ambulatory Visit: Admitting: Podiatry

## 2023-11-20 ENCOUNTER — Encounter: Payer: Self-pay | Admitting: Podiatry

## 2023-11-20 VITALS — Ht 67.0 in | Wt 144.0 lb

## 2023-11-20 DIAGNOSIS — M7751 Other enthesopathy of right foot: Secondary | ICD-10-CM

## 2023-11-20 DIAGNOSIS — M76821 Posterior tibial tendinitis, right leg: Secondary | ICD-10-CM

## 2023-11-20 MED ORDER — TRIAMCINOLONE ACETONIDE 10 MG/ML IJ SUSP
10.0000 mg | Freq: Once | INTRAMUSCULAR | Status: AC
Start: 2023-11-20 — End: 2023-11-20
  Administered 2023-11-20: 10 mg via INTRA_ARTICULAR

## 2023-11-21 NOTE — Progress Notes (Signed)
 Subjective:   Patient ID: Margaret Charles, female   DOB: 73 y.o.   MRN: [id number]   HPI Patient presents stating she has developed a lot of pain in the outside of the right foot and it has been inflamed and sore and making shoe gear and walking difficult.  States at times still has discomfort on the inside of the ankle   ROS      Objective:  Physical Exam  Neuro vas status intact with acute inflammation of the sinus tarsi right fluid buildup and mild discomfort of the posterior tibial tendon right     Assessment:  Capsulitis of the sinus tarsi right with inflammation fluid along with mild posterior tibial tendinitis     Plan:  H&P reviewed both conditions sterile prep injected the sinus tarsi right 3 mg Kenalog  5 mg Xylocaine  advised on supportive therapy and ice reappoint as symptoms indicate for either side of the ankle

## 2023-12-01 ENCOUNTER — Ambulatory Visit (HOSPITAL_COMMUNITY)
Admission: RE | Admit: 2023-12-01 | Discharge: 2023-12-01 | Disposition: A | Discharge status: Home / Self Care | Source: Ambulatory Visit | Attending: Internal Medicine | Admitting: Internal Medicine

## 2023-12-01 ENCOUNTER — Encounter (HOSPITAL_COMMUNITY): Payer: Self-pay

## 2023-12-01 ENCOUNTER — Inpatient Hospital Stay: Attending: Physician Assistant

## 2023-12-01 ENCOUNTER — Other Ambulatory Visit: Payer: Medicare PPO

## 2023-12-01 DIAGNOSIS — C3431 Malignant neoplasm of lower lobe, right bronchus or lung: Secondary | ICD-10-CM | POA: Insufficient documentation

## 2023-12-01 DIAGNOSIS — I77810 Thoracic aortic ectasia: Secondary | ICD-10-CM | POA: Diagnosis not present

## 2023-12-01 DIAGNOSIS — Z85118 Personal history of other malignant neoplasm of bronchus and lung: Secondary | ICD-10-CM | POA: Diagnosis present

## 2023-12-01 DIAGNOSIS — C3490 Malignant neoplasm of unspecified part of unspecified bronchus or lung: Secondary | ICD-10-CM | POA: Insufficient documentation

## 2023-12-01 DIAGNOSIS — G8929 Other chronic pain: Secondary | ICD-10-CM | POA: Insufficient documentation

## 2023-12-01 DIAGNOSIS — Z902 Acquired absence of lung [part of]: Secondary | ICD-10-CM | POA: Insufficient documentation

## 2023-12-01 DIAGNOSIS — J929 Pleural plaque without asbestos: Secondary | ICD-10-CM | POA: Diagnosis not present

## 2023-12-01 DIAGNOSIS — J439 Emphysema, unspecified: Secondary | ICD-10-CM | POA: Diagnosis not present

## 2023-12-01 LAB — CBC WITH DIFFERENTIAL (CANCER CENTER ONLY)
Abs Immature Granulocytes: 0.02 10*3/uL (ref 0.00–0.07)
Basophils Absolute: 0 10*3/uL (ref 0.0–0.1)
Basophils Relative: 0 %
Eosinophils Absolute: 0.2 10*3/uL (ref 0.0–0.5)
Eosinophils Relative: 2 %
HCT: 38 % (ref 36.0–46.0)
Hemoglobin: 12.8 g/dL (ref 12.0–15.0)
Immature Granulocytes: 0 %
Lymphocytes Relative: 24 %
Lymphs Abs: 2.4 10*3/uL (ref 0.7–4.0)
MCH: 30.1 pg (ref 26.0–34.0)
MCHC: 33.7 g/dL (ref 30.0–36.0)
MCV: 89.4 fL (ref 80.0–100.0)
Monocytes Absolute: 0.9 10*3/uL (ref 0.1–1.0)
Monocytes Relative: 9 %
Neutro Abs: 6.7 10*3/uL (ref 1.7–7.7)
Neutrophils Relative %: 65 %
Platelet Count: 327 10*3/uL (ref 150–400)
RBC: 4.25 MIL/uL (ref 3.87–5.11)
RDW: 13.3 % (ref 11.5–15.5)
WBC Count: 10.2 10*3/uL (ref 4.0–10.5)
nRBC: 0 % (ref 0.0–0.2)

## 2023-12-01 LAB — CMP (CANCER CENTER ONLY)
ALT: 14 U/L (ref 0–44)
AST: 16 U/L (ref 15–41)
Albumin: 4 g/dL (ref 3.5–5.0)
Alkaline Phosphatase: 105 U/L (ref 38–126)
Anion gap: 3 — ABNORMAL LOW (ref 5–15)
BUN: 14 mg/dL (ref 8–23)
CO2: 29 mmol/L (ref 22–32)
Calcium: 9.5 mg/dL (ref 8.9–10.3)
Chloride: 104 mmol/L (ref 98–111)
Creatinine: 0.85 mg/dL (ref 0.44–1.00)
GFR, Estimated: >60 mL/min (ref >60)
Glucose, Bld: 104 mg/dL — ABNORMAL HIGH (ref 70–99)
Potassium: 4.8 mmol/L (ref 3.5–5.1)
Sodium: 136 mmol/L (ref 135–145)
Total Bilirubin: 0.6 mg/dL (ref 0.0–1.2)
Total Protein: 6.9 g/dL (ref 6.5–8.1)

## 2023-12-01 MED ORDER — SODIUM CHLORIDE (PF) 0.9 % IJ SOLN
INTRAMUSCULAR | Status: AC
  Filled 2023-12-01: qty 50

## 2023-12-01 MED ORDER — IOHEXOL 300 MG/ML  SOLN
75.0000 mL | Freq: Once | INTRAMUSCULAR | Status: AC | PRN
  Administered 2023-12-01: 75 mL via INTRAVENOUS

## 2023-12-02 DIAGNOSIS — Z08 Encounter for follow-up examination after completed treatment for malignant neoplasm: Secondary | ICD-10-CM | POA: Diagnosis not present

## 2023-12-02 DIAGNOSIS — L821 Other seborrheic keratosis: Secondary | ICD-10-CM | POA: Diagnosis not present

## 2023-12-02 DIAGNOSIS — L814 Other melanin hyperpigmentation: Secondary | ICD-10-CM | POA: Diagnosis not present

## 2023-12-02 DIAGNOSIS — L570 Actinic keratosis: Secondary | ICD-10-CM | POA: Diagnosis not present

## 2023-12-02 DIAGNOSIS — Z85828 Personal history of other malignant neoplasm of skin: Secondary | ICD-10-CM | POA: Diagnosis not present

## 2023-12-03 ENCOUNTER — Ambulatory Visit: Payer: Medicare PPO | Admitting: Internal Medicine

## 2023-12-08 ENCOUNTER — Inpatient Hospital Stay: Admitting: Internal Medicine

## 2023-12-08 VITALS — BP 115/81 | HR 51 | Temp 97.9°F | Resp 16 | Ht 67.0 in | Wt 143.2 lb

## 2023-12-08 DIAGNOSIS — G8929 Other chronic pain: Secondary | ICD-10-CM | POA: Diagnosis not present

## 2023-12-08 DIAGNOSIS — C3431 Malignant neoplasm of lower lobe, right bronchus or lung: Secondary | ICD-10-CM | POA: Diagnosis not present

## 2023-12-08 DIAGNOSIS — C3490 Malignant neoplasm of unspecified part of unspecified bronchus or lung: Secondary | ICD-10-CM | POA: Diagnosis not present

## 2023-12-08 DIAGNOSIS — Z902 Acquired absence of lung [part of]: Secondary | ICD-10-CM | POA: Diagnosis not present

## 2023-12-08 NOTE — Progress Notes (Signed)
 [facility] [phone]   [phone]  OFFICE PROGRESS NOTE  Almira Jaeger, MD [address] [place] [address]  DIAGNOSIS: Stage IA (T1b, N0, M0) non-small cell lung cancer, squamous cell carcinoma diagnosed in March 2019.  PRIOR THERAPY:  Status post right lower lobectomy with lymph node dissection under the care of Dr. Nicanor Barge on September 16, 2017.  CURRENT THERAPY: Observation.  INTERVAL HISTORY: Margaret Charles 73 y.o. female returns to the clinic today for annual follow-up visit.Discussed the use of AI scribe software for clinical note transcription with the patient, who gave verbal consent to proceed.  History of Present Illness   Margaret Charles is a 73 year old female with stage IA non-small cell lung cancer who presents for evaluation with repeat CT scan for restaging of her disease. Referred by Dr. Nicanor Barge, who performed her surgery and has retired, to Dr. Luna Salinas for continued care.  Diagnosed with stage IA non-small cell lung cancer, squamous cell carcinoma, in March 2019 and underwent a right lower lobectomy. Since then, she has been on observation and is here for a repeat CT scan to restage her disease. No new symptoms such as shortness of breath, cough, headaches, or changes in vision have been noted since her last visit a year ago.  Experiences persistent nerve pain from the surgery, which is unlikely to resolve after six years. Despite this, she maintains an active lifestyle, walking three to four miles a couple of times a week.  Her weight has remained stable, and she has not experienced any weight loss.  Has a stable aneurysm, described as stable ectasia, and is scheduled to see Dr. Luna Salinas for follow-up.       MEDICAL HISTORY: Past Medical History:  Diagnosis Date   A-fib [facility])    post op lung transfer, never have been on medication   Allergy    SEASONAL   Arthritis    ddd   Cataract     BILATERAL,REMOVED   Chronic headaches    imprved recently per patient    Emphysema of lung (HCC)    asymptomatic. noted on CT   Family history of colon cancer    GERD (gastroesophageal reflux disease)    History of adenomatous polyp of colon    2015 precancerous polyp history- due spring 2020.  5 years   History of seizures    1980s few ??? seizure activity  (vagal response)LAST ONE IN THE 1980'S NONE SINCE UPDATED 04/18/22   Hyperlipidemia    Hypertension    Lung cancer (HCC) dx'd 07/2017   Neuromuscular disorder (HCC)    NERVE DAMAGE   Palpitations    RBBB (right bundle branch block)    palpitations   Seizures (HCC)    Skin cancer    skin basal cell cancer   Substance abuse (HCC)    ETOH,ABUSE SOBER FOR 40 YEARS UPDATED 04/18/22   Thyroid  disease    HYPO    ALLERGIES:  is allergic to codeine and quinolones.  MEDICATIONS:  Current Outpatient Medications  Medication Sig Dispense Refill   acetaminophen  (TYLENOL ) 325 MG tablet Take 650 mg by mouth every 6 (six) hours as needed for mild pain.     apixaban  (ELIQUIS ) 5 MG TABS tablet Take 1 tablet (5 mg total) by mouth 2 (two) times daily. 180 tablet 3   flecainide  (TAMBOCOR ) 150 MG tablet Take by mouth.     fluticasone (CUTIVATE) 0.05 % cream Apply topically.  fluticasone (FLONASE) 50 MCG/ACT nasal spray Place 1 spray into both nostrils daily.     ketoconazole (NIZORAL) 2 % cream Apply topically 2 (two) times daily as needed.     levothyroxine  (SYNTHROID ) 75 MCG tablet TAKE 1 TABLET BY MOUTH EVERY DAY 90 tablet 3   metoprolol  tartrate (LOPRESSOR ) 25 MG tablet TAKE 1 TABLET BY MOUTH TWICE A DAY 180 tablet 2   Multiple Vitamins-Minerals (CENTRUM ADULTS PO) Take 1 tablet by mouth daily.     Propylene Glycol-Glycerin (SOOTHE OP) Place 1 drop into both eyes 2 (two) times daily as needed (dry eyes).     rosuvastatin  (CRESTOR ) 5 MG tablet TAKE 1 TABLET BY MOUTH EVERY MONDAY, WEDNESDAY, AND FRIDAY 36 tablet 4   Current  Facility-Administered Medications  Medication Dose Route Frequency Provider Last Rate Last Admin   0.9 %  sodium chloride  infusion  500 mL Intravenous Continuous Armbruster, Lendon Queen, MD        SURGICAL HISTORY:  Past Surgical History:  Procedure Laterality Date   BLADDER SURGERY  1985   Long term incontinence. [facility]- used small intestine to build bladder   BREAST BIOPSY  2001   CATARACT EXTRACTION W/ INTRAOCULAR LENS  IMPLANT, BILATERAL     EYE SURGERY  Cataract surgery   Intestinal blockage  1989 and 1990   bowel obstruction surgery   Lung cancer removal   08/2017   Right lower lobe   SMALL INTESTINE SURGERY  1985, 1989, 1990   VIDEO ASSISTED THORACOSCOPY (VATS)/WEDGE RESECTION Right 09/16/2017   Procedure: VIDEO ASSISTED THORACOSCOPY (VATS)/LUNG RESECTION;  Surgeon: Norita Beauvais, MD;  Location: [facility] OR;  Service: Thoracic;  Laterality: Right;   VIDEO BRONCHOSCOPY N/A 09/16/2017   Procedure: VIDEO BRONCHOSCOPY;  Surgeon: Norita Beauvais, MD;  Location: [facility] OR;  Service: Thoracic;  Laterality: N/A;   WRIST SURGERY     de quervains tenosynovitis. Dr. Alusio actually did this.     REVIEW OF SYSTEMS:  A comprehensive review of systems was negative except for: Respiratory: positive for pleurisy/chest pain   PHYSICAL EXAMINATION: General appearance: alert, cooperative, and no distress Head: Normocephalic, without obvious abnormality, atraumatic Neck: no adenopathy, no JVD, supple, symmetrical, trachea midline, and thyroid  not enlarged, symmetric, no tenderness/mass/nodules Lymph nodes: Cervical, supraclavicular, and axillary nodes normal. Resp: clear to auscultation bilaterally Back: symmetric, no curvature. ROM normal. No CVA tenderness. Cardio: regular rate and rhythm, S1, S2 normal, no murmur, click, rub or gallop GI: soft, non-tender; bowel sounds normal; no masses,  no organomegaly Extremities: extremities normal, atraumatic, no cyanosis or edema  ECOG PERFORMANCE STATUS:  1 - Symptomatic but completely ambulatory  Blood pressure 115/81, pulse (!) 51, temperature 97.9 F (36.6 C), temperature source Oral, resp. rate 16, height 5' 7 (1.702 m), weight 143 lb 3.2 oz (65 kg), SpO2 100%.  LABORATORY DATA: Lab Results  Component Value Date   WBC 10.2 12/01/2023   HGB 12.8 12/01/2023   HCT 38.0 12/01/2023   MCV 89.4 12/01/2023   PLT 327 12/01/2023      Chemistry      Component Value Date/Time   NA 136 12/01/2023 0940   K 4.8 12/01/2023 0940   CL 104 12/01/2023 0940   CO2 29 12/01/2023 0940   BUN 14 12/01/2023 0940   CREATININE 0.85 12/01/2023 0940   CREATININE 0.81 03/30/2020 0920      Component Value Date/Time   CALCIUM  9.5 12/01/2023 0940   ALKPHOS 105 12/01/2023 0940   AST 16 12/01/2023 0940  ALT 14 12/01/2023 0940   BILITOT 0.6 12/01/2023 0940       RADIOGRAPHIC STUDIES: CT Chest W Contrast Result Date: 12/02/2023 CLINICAL DATA:  Non-small-cell lung cancer. Staging. Chronic right lateral chest pain since previous surgery. * Tracking Code: BO * EXAM: CT CHEST WITH CONTRAST TECHNIQUE: Multidetector CT imaging of the chest was performed during intravenous contrast administration. RADIATION DOSE REDUCTION: This exam was performed according to the departmental dose-optimization program which includes automated exposure control, adjustment of the mA and/or kV according to patient size and/or use of iterative reconstruction technique. CONTRAST:  75mL OMNIPAQUE  IOHEXOL  300 MG/ML  SOLN COMPARISON:  CT 12/03/2022.  Older exams as well. FINDINGS: Cardiovascular: Heart is nonenlarged. No pericardial effusion. Coronary artery calcifications are seen. Ectasia of the ascending aorta with diameter of 4.1 cm. Similar to previous. Scattered partially calcified atherosclerotic plaque along the thoracic aorta. Mediastinum/Nodes: Atrophic thyroid  gland. Slightly patulous thoracic esophagus. No specific abnormal lymph node enlargement identified in the axillary regions,  hilum or mediastinum. Few small less than 1 cm in size in short axis mediastinal nodes are seen, not pathologic by size criteria. Lungs/Pleura: Apical pleural thickening. There is scattered areas of interstitial septal thickening bilaterally once again. Previous resection of the right lower lobe with some volume loss along the right hemithorax. Pleural thickening and a small pleural effusion is stable. Underlying scattered centrilobular emphysematous changes. No new consolidation or dominant lung mass. Upper Abdomen: Fatty liver infiltration noted in the upper abdomen. Elevated right hemidiaphragm. Adrenal glands are stable. Musculoskeletal: Curvature of the spine. Scattered degenerative changes. IMPRESSION: Overall no significant interval change. Stable surgical changes of right-sided lower lobectomy with adjacent small area of pleural fluid and pleural thickening. Other chronic lung changes identified. No developing new mass lesion or nodal enlargement in the thorax. Fatty liver infiltration. Stable ectasia of the ascending aorta of 4.1 cm. Recommend annual imaging followup by CTA or MRA. This recommendation follows 2010 ACCF/AHA/AATS/ACR/ASA/SCA/SCAI/SIR/STS/SVM Guidelines for the Diagnosis and Management of Patients with Thoracic Aortic Disease. Circulation. 2010; 121: U981-X914. Aortic aneurysm NOS (ICD10-I71.9) Aortic Atherosclerosis (ICD10-I70.0) and Emphysema (ICD10-J43.9). Electronically Signed   By: Adrianna Horde M.D.   On: 12/02/2023 18:11     ASSESSMENT AND PLAN: This is a very pleasant 73 years old white female with a stage Ia non-small cell lung cancer status post right lower lobectomy with lymph node dissection in March 2019 under the care of Dr. Nicanor Barge.   The patient has been on observation for the last 6 years and she is doing fine with no concerning complaints. She had repeat CT scan of the chest performed recently.  I personally independently reviewed the scan and discussed the result with  the patient today.  Her scan showed no concerning findings for disease recurrence or metastasis. Assessment and Plan    Non-small cell lung cancer, stage 1A, squamous cell carcinoma Diagnosed in March 2019, status post right lower lobectomy with no evidence of recurrence or metastasis on recent CT scan. Disease remains stable with no new lesions or spread. CT scan also monitors stable aortic ectasia. - Continue annual CT scan of the chest for restaging and monitoring of the aorta.  Chronic post-surgical nerve pain Persistent since right lower lobectomy in 2019, not expected to resolve given the duration since surgery.   The patient was advised to call immediately if she has any concerning symptoms in the interval. The patient voices understanding of current disease status and treatment options and is in agreement with the current care  plan.  All questions were answered. The patient knows to call the clinic with any problems, questions or concerns. We can certainly see the patient much sooner if necessary.   Disclaimer: This note was dictated with voice recognition software. Similar sounding words can inadvertently be transcribed and may not be corrected upon review.

## 2023-12-09 ENCOUNTER — Ambulatory Visit
Attending: Thoracic Surgery (Cardiothoracic Vascular Surgery) | Admitting: Thoracic Surgery (Cardiothoracic Vascular Surgery)

## 2023-12-09 VITALS — BP 135/79 | HR 60 | Resp 20 | Ht 67.0 in | Wt 143.0 lb

## 2023-12-09 DIAGNOSIS — I7121 Aneurysm of the ascending aorta, without rupture: Secondary | ICD-10-CM

## 2023-12-09 DIAGNOSIS — Z85118 Personal history of other malignant neoplasm of bronchus and lung: Secondary | ICD-10-CM

## 2023-12-09 NOTE — Progress Notes (Signed)
 [address] [place] [address]             [phone]     HPI: [name] returns for annual follow-up of her ascending aneurysm.  Margaret Charles is a 73 year old woman with a history of tobacco abuse, stage Ia non-small cell carcinoma of the lung, right lower lobectomy, postoperative intercostal neuralgia, postoperative atrial fibrillation, arthritis, hypertension, right bundle branch block, skin cancer, coronary calcification on CT, and an ascending aortic aneurysm.  Dr. Nicanor Barge performed a right lower lobectomy in 2019 for stage Ia non-small cell carcinoma.  She has remained free of disease since that time.  I last saw her in June 2024.  Her aneurysm was stable at 4.1 cm.  She had no evidence of recurrent disease.  In the interim since her last visit she has been feeling well.  Still has intercostal neuralgia.  Has tried medications, injections, and acupuncture without success.  Past Medical History:  Diagnosis Date   A-fib [facility])    post op lung transfer, never have been on medication   Allergy    SEASONAL   Arthritis    ddd   Cataract    BILATERAL,REMOVED   Chronic headaches    imprved recently per patient    Emphysema of lung (HCC)    asymptomatic. noted on CT   Family history of colon cancer    GERD (gastroesophageal reflux disease)    History of adenomatous polyp of colon    2015 precancerous polyp history- due spring 2020.  5 years   History of seizures    1980s few ??? seizure activity  (vagal response)LAST ONE IN THE 1980'S NONE SINCE UPDATED 04/18/22   Hyperlipidemia    Hypertension    Lung cancer (HCC) dx'd 07/2017   Neuromuscular disorder (HCC)    NERVE DAMAGE   Palpitations    RBBB (right bundle branch block)    palpitations   Seizures (HCC)    Skin cancer    skin basal cell cancer   Substance abuse (HCC)    ETOH,ABUSE SOBER FOR 40 YEARS UPDATED 04/18/22   Thyroid  disease    HYPO    Current Outpatient Medications   Medication Sig Dispense Refill   acetaminophen  (TYLENOL ) 325 MG tablet Take 650 mg by mouth every 6 (six) hours as needed for mild pain.     apixaban  (ELIQUIS ) 5 MG TABS tablet Take 1 tablet (5 mg total) by mouth 2 (two) times daily. 180 tablet 3   flecainide  (TAMBOCOR ) 150 MG tablet Take by mouth.     fluticasone (CUTIVATE) 0.05 % cream Apply topically.     fluticasone (FLONASE) 50 MCG/ACT nasal spray Place 1 spray into both nostrils daily.     levothyroxine  (SYNTHROID ) 75 MCG tablet TAKE 1 TABLET BY MOUTH EVERY DAY 90 tablet 3   metoprolol  tartrate (LOPRESSOR ) 25 MG tablet TAKE 1 TABLET BY MOUTH TWICE A DAY 180 tablet 2   Multiple Vitamins-Minerals (CENTRUM ADULTS PO) Take 1 tablet by mouth daily.     Propylene Glycol-Glycerin (SOOTHE OP) Place 1 drop into both eyes 2 (two) times daily as needed (dry eyes).     rosuvastatin  (CRESTOR ) 5 MG tablet TAKE 1 TABLET BY MOUTH EVERY MONDAY, WEDNESDAY, AND FRIDAY 36 tablet 4   ketoconazole (NIZORAL) 2 % cream Apply topically 2 (two) times daily as needed. (Patient not taking: Reported on 12/09/2023)     Current Facility-Administered Medications  Medication Dose Route Frequency Provider Last  Rate Last Admin   0.9 %  sodium chloride  infusion  500 mL Intravenous Continuous Armbruster, Lendon Queen, MD        Physical Exam BP 135/79   Pulse 60   Resp 20   Ht 5' 7 (1.702 m)   Wt 143 lb (64.9 kg)   SpO2 95% Comment: RA  BMI 22.8 kg/m  73 year old woman in no acute distress Alert and oriented x 3 with no focal deficits Lungs diminished to right base but otherwise clear Cardiac regular rate and rhythm with a normal S1 and S2 Pulses intact No peripheral edema  Diagnostic Tests: CT CHEST WITH CONTRAST   TECHNIQUE: Multidetector CT imaging of the chest was performed during intravenous contrast administration.   RADIATION DOSE REDUCTION: This exam was performed according to the departmental dose-optimization program which includes  automated exposure control, adjustment of the mA and/or kV according to patient size and/or use of iterative reconstruction technique.   CONTRAST:  75mL OMNIPAQUE  IOHEXOL  300 MG/ML  SOLN   COMPARISON:  CT 12/03/2022.  Older exams as well.   FINDINGS: Cardiovascular: Heart is nonenlarged. No pericardial effusion. Coronary artery calcifications are seen. Ectasia of the ascending aorta with diameter of 4.1 cm. Similar to previous. Scattered partially calcified atherosclerotic plaque along the thoracic aorta.   Mediastinum/Nodes: Atrophic thyroid  gland. Slightly patulous thoracic esophagus. No specific abnormal lymph node enlargement identified in the axillary regions, hilum or mediastinum. Few small less than 1 cm in size in short axis mediastinal nodes are seen, not pathologic by size criteria.   Lungs/Pleura: Apical pleural thickening. There is scattered areas of interstitial septal thickening bilaterally once again. Previous resection of the right lower lobe with some volume loss along the right hemithorax. Pleural thickening and a small pleural effusion is stable. Underlying scattered centrilobular emphysematous changes. No new consolidation or dominant lung mass.   Upper Abdomen: Fatty liver infiltration noted in the upper abdomen. Elevated right hemidiaphragm. Adrenal glands are stable.   Musculoskeletal: Curvature of the spine. Scattered degenerative changes.   IMPRESSION: Overall no significant interval change. Stable surgical changes of right-sided lower lobectomy with adjacent small area of pleural fluid and pleural thickening. Other chronic lung changes identified.   No developing new mass lesion or nodal enlargement in the thorax.   Fatty liver infiltration.   Stable ectasia of the ascending aorta of 4.1 cm. Recommend annual imaging followup by CTA or MRA. This recommendation follows 2010 ACCF/AHA/AATS/ACR/ASA/SCA/SCAI/SIR/STS/SVM Guidelines for the Diagnosis and  Management of Patients with Thoracic Aortic Disease. Circulation. 2010; 121: G956-O130. Aortic aneurysm NOS (ICD10-I71.9)   Aortic Atherosclerosis (ICD10-I70.0) and Emphysema (ICD10-J43.9).     Electronically Signed   By: Adrianna Horde M.D.   On: 12/02/2023 18:11   I personally reviewed the CT images.  No change in 4.1 cm ascending aorta.  No evidence of recurrent lung cancer.  Coronary calcification.  Aortic atherosclerosis.  Emphysema.  Impression: Margaret Charles is a 73 year old woman with a history of tobacco abuse, stage Ia non-small cell carcinoma of the lung, right lower lobectomy, postoperative intercostal neuralgia, postoperative atrial fibrillation, arthritis, hypertension, right bundle branch block, skin cancer, coronary calcification on CT, and an ascending aortic aneurysm.  Ascending aortic aneurysm-stable at 4.1 cm.  Needs continued annual follow-up.  Blood pressure is well-controlled.  Coronary calcification on CT-she had questions regarding that finding.  Actually has relatively small amount of calcium  present.  She is on a statin.  Stage Ia non-small cell carcinoma of the lung-status post right lower lobectomy  in 2019.  No evidence of recurrent disease.  Follow-up with Dr. Marguerita Shih.  Plan: Return in 1 year with CT angio chest  Zelphia Higashi, MD [facility] [phone]

## 2023-12-11 ENCOUNTER — Ambulatory Visit: Admitting: Podiatry

## 2024-01-29 DIAGNOSIS — Z09 Encounter for follow-up examination after completed treatment for conditions other than malignant neoplasm: Secondary | ICD-10-CM | POA: Diagnosis not present

## 2024-01-29 DIAGNOSIS — Z08 Encounter for follow-up examination after completed treatment for malignant neoplasm: Secondary | ICD-10-CM | POA: Diagnosis not present

## 2024-01-29 DIAGNOSIS — L570 Actinic keratosis: Secondary | ICD-10-CM | POA: Diagnosis not present

## 2024-01-29 DIAGNOSIS — Z85828 Personal history of other malignant neoplasm of skin: Secondary | ICD-10-CM | POA: Diagnosis not present

## 2024-02-11 ENCOUNTER — Other Ambulatory Visit: Payer: Self-pay | Admitting: Family Medicine

## 2024-02-11 DIAGNOSIS — E039 Hypothyroidism, unspecified: Secondary | ICD-10-CM

## 2024-02-11 NOTE — Telephone Encounter (Signed)
 Last OV 05/05/23 Next OV 05/06/24  Last refill 02/14/23 Qty #90/3

## 2024-02-18 DIAGNOSIS — M542 Cervicalgia: Secondary | ICD-10-CM | POA: Diagnosis not present

## 2024-03-09 DIAGNOSIS — M542 Cervicalgia: Secondary | ICD-10-CM | POA: Diagnosis not present

## 2024-03-19 DIAGNOSIS — L728 Other follicular cysts of the skin and subcutaneous tissue: Secondary | ICD-10-CM | POA: Diagnosis not present

## 2024-03-19 DIAGNOSIS — L080 Pyoderma: Secondary | ICD-10-CM | POA: Diagnosis not present

## 2024-03-22 DIAGNOSIS — M542 Cervicalgia: Secondary | ICD-10-CM | POA: Diagnosis not present

## 2024-03-26 DIAGNOSIS — M542 Cervicalgia: Secondary | ICD-10-CM | POA: Diagnosis not present

## 2024-03-29 DIAGNOSIS — M542 Cervicalgia: Secondary | ICD-10-CM | POA: Diagnosis not present

## 2024-04-07 DIAGNOSIS — M542 Cervicalgia: Secondary | ICD-10-CM | POA: Diagnosis not present

## 2024-04-09 DIAGNOSIS — M542 Cervicalgia: Secondary | ICD-10-CM | POA: Diagnosis not present

## 2024-04-12 ENCOUNTER — Ambulatory Visit: Payer: Medicare PPO

## 2024-04-16 DIAGNOSIS — M542 Cervicalgia: Secondary | ICD-10-CM | POA: Diagnosis not present

## 2024-04-19 DIAGNOSIS — M542 Cervicalgia: Secondary | ICD-10-CM | POA: Diagnosis not present

## 2024-04-21 DIAGNOSIS — M542 Cervicalgia: Secondary | ICD-10-CM | POA: Diagnosis not present

## 2024-04-28 ENCOUNTER — Ambulatory Visit (INDEPENDENT_AMBULATORY_CARE_PROVIDER_SITE_OTHER)

## 2024-04-28 VITALS — Ht 67.0 in | Wt 143.0 lb

## 2024-04-28 DIAGNOSIS — Z0000 Encounter for general adult medical examination without abnormal findings: Secondary | ICD-10-CM

## 2024-04-28 NOTE — Patient Instructions (Signed)
 Margaret Charles,  Thank you for taking the time for your Medicare Wellness Visit. I appreciate your continued commitment to your health goals. Please review the care plan we discussed, and feel free to reach out if I can assist you further.  Please note that Annual Wellness Visits do not include a physical exam. Some assessments may be limited, especially if the visit was conducted virtually. If needed, we may recommend an in-person follow-up with your provider.  Ongoing Care Seeing your primary care provider every 3 to 6 months helps us  monitor your health and provide consistent, personalized care.   Referrals If a referral was made during today's visit and you haven't received any updates within two weeks, please contact the referred provider directly to check on the status.  Recommended Screenings:  Health Maintenance  Topic Date Due   Flu Shot  01/23/2024   COVID-19 Vaccine (7 - 2025-26 season) 02/23/2024   Medicare Annual Wellness Visit  04/06/2024   DTaP/Tdap/Td vaccine (2 - Td or Tdap) 09/14/2024   Breast Cancer Screening  09/15/2025   Colon Cancer Screening  05/04/2027   Pneumococcal Vaccine for age over 48  Completed   DEXA scan (bone density measurement)  Completed   Hepatitis C Screening  Completed   Zoster (Shingles) Vaccine  Completed   Meningitis B Vaccine  Aged Out   Screening for Lung Cancer  Discontinued       04/07/2023    7:50 AM  Advanced Directives  Does Patient Have a Medical Advance Directive? Yes  Type of Estate Agent of [place];Living will  Copy of Healthcare Power of Attorney in Chart? No - copy requested    Vision: Annual vision screenings are recommended for early detection of glaucoma, cataracts, and diabetic retinopathy. These exams can also reveal signs of chronic conditions such as diabetes and high blood pressure.  Dental: Annual dental screenings help detect early signs of oral cancer, gum disease, and other conditions linked  to overall health, including heart disease and diabetes.  Please see the attached documents for additional preventive care recommendations.

## 2024-04-28 NOTE — Progress Notes (Signed)
 Subjective:   Margaret Charles is a 73 y.o. female who presents for a Medicare Annual Wellness Visit.  I connected with  Margaret Charles on 04/28/24 by a audio enabled telemedicine application and verified that I am speaking with the correct person using two identifiers.  Patient Location: Home  Provider Location: Home Office  I discussed the limitations of evaluation and management by telemedicine. The patient expressed understanding and agreed to proceed.   Allergies (verified) Codeine and Quinolones   History: Past Medical History:  Diagnosis Date   A-fib (HCC)    post op lung transfer, never have been on medication   Allergy    SEASONAL   Arthritis    ddd   Cataract    BILATERAL,REMOVED   Chronic headaches    imprved recently per patient    Emphysema of lung (HCC)    asymptomatic. noted on CT   Family history of colon cancer    GERD (gastroesophageal reflux disease)    History of adenomatous polyp of colon    2015 precancerous polyp history- due spring 2020.  5 years   History of seizures    1980s few ??? seizure activity  (vagal response)LAST ONE IN THE 1980'S NONE SINCE UPDATED 04/18/22   Hyperlipidemia    Hypertension    Lung cancer (HCC) dx'd 07/2017   Neuromuscular disorder (HCC)    NERVE DAMAGE   Palpitations    RBBB (right bundle branch block)    palpitations   Seizures (HCC)    Skin cancer    skin basal cell cancer   Substance abuse (HCC)    ETOH,ABUSE SOBER FOR 40 YEARS UPDATED 04/18/22   Thyroid  disease    HYPO   Past Surgical History:  Procedure Laterality Date   BLADDER SURGERY  1985   Long term incontinence. [facility]- used small intestine to build bladder   BREAST BIOPSY  2001   CATARACT EXTRACTION W/ INTRAOCULAR LENS  IMPLANT, BILATERAL     EYE SURGERY  Cataract surgery   Intestinal blockage  1989 and 1990   bowel obstruction surgery   Lung cancer removal   08/2017   Right lower lobe   SMALL INTESTINE SURGERY  1985, 1989, 1990   VIDEO  ASSISTED THORACOSCOPY (VATS)/WEDGE RESECTION Right 09/16/2017   Procedure: VIDEO ASSISTED THORACOSCOPY (VATS)/LUNG RESECTION;  Surgeon: Army Dallas NOVAK, MD;  Location: [facility] OR;  Service: Thoracic;  Laterality: Right;   VIDEO BRONCHOSCOPY N/A 09/16/2017   Procedure: VIDEO BRONCHOSCOPY;  Surgeon: Army Dallas NOVAK, MD;  Location: [facility] OR;  Service: Thoracic;  Laterality: N/A;   WRIST SURGERY     de quervains tenosynovitis. Dr. Alusio actually did this.    Family History  Problem Relation Age of Onset   Dementia Mother    Heart attack Mother        age 77. after hip fracture.    Hypertension Mother    Aortic aneurysm Mother    Prostate cancer Father    Hypertension Father    Colon polyps Father    Diabetes Father    Breast cancer Sister    Hypertension Sister    Heart attack Sister        5. former smoker   Asthma Sister    Cancer Sister    Hypertension Sister    Atrial fibrillation Sister    Aortic aneurysm Sister        died at 108 despite s/p open heart repair   Heart failure Sister    COPD Sister  Heart disease Sister    Heart attack Paternal Grandfather    Esophageal cancer Paternal Aunt    Colon cancer Other        uncle, cousin   Colon polyps Other        family history   Crohn's disease Neg Hx    Rectal cancer Neg Hx    Stomach cancer Neg Hx    Ulcerative colitis Neg Hx    Social History   Occupational History   Occupation: Retired Runner, Broadcasting/film/video  Tobacco Use   Smoking status: Former    Current packs/day: 0.00    Average packs/day: 1 pack/day for 40.0 years (40.0 ttl pk-yrs)    Types: Cigarettes    Start date: 12/03/1968    Quit date: 12/03/2008    Years since quitting: 15.4    Passive exposure: Past   Smokeless tobacco: Never  Vaping Use   Vaping status: Never Used  Substance and Sexual Activity   Alcohol use: Not Currently    Comment: hx  etoh  38 yrs   Drug use: Never   Sexual activity: Not Currently    Birth control/protection: None   Tobacco  Counseling Counseling given: Not Answered  SDOH Screenings   Food Insecurity: No Food Insecurity (04/24/2024)  Housing: Unknown (04/24/2024)  Transportation Needs: No Transportation Needs (04/24/2024)  Utilities: Not At Risk (04/28/2024)  Depression (PHQ2-9): Low Risk  (04/28/2024)  Financial Resource Strain: Low Risk  (04/24/2024)  Physical Activity: Sufficiently Active (04/24/2024)  Social Connections: Moderately Integrated (04/24/2024)  Stress: Stress Concern Present (04/24/2024)  Tobacco Use: Medium Risk (04/28/2024)  Health Literacy: Adequate Health Literacy (04/28/2024)   Depression Screen    04/28/2024    2:50 PM 05/05/2023    8:44 AM 04/07/2023    7:47 AM 03/18/2022    1:09 PM 04/02/2021    8:15 AM 11/16/2020   12:58 PM 11/18/2019   10:14 AM  PHQ 2/9 Scores  PHQ - 2 Score 0 2 0 0 0 0 0  PHQ- 9 Score  3          Goals Addressed               This Visit's Progress     exercise and watch diet (pt-stated)        Exercise and diet health        Visit info / Clinical Intake: Medicare Wellness Visit Type:: Subsequent Annual Wellness Visit Medicare Wellness Visit Mode:: Telephone If telephone:: video declined If telephone or video:: unable to obtan vitals due to lack of equipment Interpreter Needed?: No Pre-visit prep was completed: yes AWV questionnaire completed by patient prior to visit?: yes Date:: 04/24/24 Living arrangements:: (!) lives alone Patient's Overall Health Status Rating: good Typical amount of pain: none Does pain affect daily life?: no Are you currently prescribed opioids?: no  Dietary Habits and Nutritional Risks How many meals a day?: 3 Eats fruit and vegetables daily?: yes Most meals are obtained by: preparing own meals Diabetic:: no  Functional Status Activities of Daily Living (to include ambulation/medication): (Patient-Rptd) Independent Ambulation: Independent with device- listed below Home Assistive Devices/Equipment:  Eyeglasses Medication Administration: Independent Home Management: (Patient-Rptd) Independent Manage your own finances?: yes Primary transportation is: driving Concerns about vision?: no *vision screening is required for WTM* Concerns about hearing?: no  Fall Screening Falls in the past year?: 0 Number of falls in past year: (Patient-Rptd) 0 Was there an injury with Fall?: (Patient-Rptd) 0 Fall Risk Category Calculator: 0 Patient Fall Risk Level:  Low Fall Risk  Fall Risk Patient at Risk for Falls Due to: Impaired balance/gait Fall risk Follow up: Falls prevention discussed  Home and Transportation Safety: All rugs have non-skid backing?: yes All stairs or steps have railings?: (!) no Grab bars in the bathtub or shower?: yes Have non-skid surface in bathtub or shower?: yes Good home lighting?: yes Regular seat belt use?: yes Hospital stays in the last year:: no  Cognitive Assessment Difficulty concentrating, remembering, or making decisions? : no Will 6CIT or Mini Cog be Completed: no 6CIT or Mini Cog Declined: patient alert, oriented, able to answer questions appropriately and recall recent events  Advance Directives (For Healthcare) Does Patient Have a Medical Advance Directive?: Yes Type of Advance Directive: Healthcare Power of Attorney  Reviewed/Updated  Reviewed/Updated: All        Objective:    Today's Vitals   04/28/24 1442  Weight: 143 lb (64.9 kg)  Height: 5' 7 (1.702 m)   Body mass index is 22.4 kg/m.  Current Medications (verified) Outpatient Encounter Medications as of 04/28/2024  Medication Sig   acetaminophen  (TYLENOL ) 325 MG tablet Take 650 mg by mouth every 6 (six) hours as needed for mild pain.   apixaban  (ELIQUIS ) 5 MG TABS tablet Take 1 tablet (5 mg total) by mouth 2 (two) times daily.   flecainide  (TAMBOCOR ) 150 MG tablet Take by mouth.   fluticasone (FLONASE) 50 MCG/ACT nasal spray Place 1 spray into both nostrils daily.   levothyroxine   (SYNTHROID ) 75 MCG tablet TAKE 1 TABLET BY MOUTH EVERY DAY   metoprolol  tartrate (LOPRESSOR ) 25 MG tablet TAKE 1 TABLET BY MOUTH TWICE A DAY   Multiple Vitamins-Minerals (CENTRUM ADULTS PO) Take 1 tablet by mouth daily.   Propylene Glycol-Glycerin (SOOTHE OP) Place 1 drop into both eyes 2 (two) times daily as needed (dry eyes).   rosuvastatin  (CRESTOR ) 5 MG tablet TAKE 1 TABLET BY MOUTH EVERY MONDAY, WEDNESDAY, AND FRIDAY   [DISCONTINUED] fluticasone (CUTIVATE) 0.05 % cream Apply topically.   [DISCONTINUED] ketoconazole (NIZORAL) 2 % cream Apply topically 2 (two) times daily as needed. (Patient not taking: Reported on 12/09/2023)   Facility-Administered Encounter Medications as of 04/28/2024  Medication   0.9 %  sodium chloride  infusion   Hearing/Vision screen Hearing Screening - Comments:: Pt denies any hearing issues  Vision Screening - Comments:: Wears rx glasses - up to date with routine eye exams with Dr Maree Immunizations and Health Maintenance Health Maintenance  Topic Date Due   COVID-19 Vaccine (7 - 2025-26 season) 02/23/2024   DTaP/Tdap/Td (2 - Td or Tdap) 09/14/2024   Medicare Annual Wellness (AWV)  04/28/2025   Mammogram  09/15/2025   Colonoscopy  05/04/2027   Pneumococcal Vaccine: 50+ Years  Completed   Influenza Vaccine  Completed   DEXA SCAN  Completed   Hepatitis C Screening  Completed   Zoster Vaccines- Shingrix  Completed   Meningococcal B Vaccine  Aged Out   Lung Cancer Screening  Discontinued        Assessment/Plan:  This is a routine wellness examination for Maebry.  Patient Care Team: Katrinka Garnette KIDD, MD as PCP - General (Family Medicine) Army Dallas NOVAK, MD (Inactive) as Consulting Physician (Cardiothoracic Surgery) Sherrod Sherrod, MD as Consulting Physician (Oncology) Lorilee, Sven SQUIBB, MD as Consulting Physician (Physical Medicine and Rehabilitation) Armbruster, Elspeth SQUIBB, MD as Consulting Physician (Gastroenterology) Maree Lonni Inks,  MD as Consulting Physician (Ophthalmology) Kassie Mallick, MD (Inactive) as Consulting Physician (Endocrinology) Jeffrie Oneil BROCKS, MD as Consulting Physician (  Cardiology)  I have personally reviewed and noted the following in the patient's chart:   Medical and social history Use of alcohol, tobacco or illicit drugs  Current medications and supplements including opioid prescriptions. Functional ability and status Nutritional status Physical activity Advanced directives List of other physicians Hospitalizations, surgeries, and ER visits in previous 12 months Vitals Screenings to include cognitive, depression, and falls Referrals and appointments  No orders of the defined types were placed in this encounter.  In addition, I have reviewed and discussed with patient certain preventive protocols, quality metrics, and best practice recommendations. A written personalized care plan for preventive services as well as general preventive health recommendations were provided to patient.   Ellouise VEAR Haws, LPN   88/09/[id number]   Return in 1 year (on 04/28/2025).  After Visit Summary: (MyChart) Due to this being a telephonic visit, the after visit summary with patients personalized plan was offered to patient via MyChart   Nurse Notes: nothing significant at this time

## 2024-04-29 ENCOUNTER — Encounter: Payer: Self-pay | Admitting: Cardiology

## 2024-04-29 DIAGNOSIS — I480 Paroxysmal atrial fibrillation: Secondary | ICD-10-CM

## 2024-04-30 MED ORDER — APIXABAN 5 MG PO TABS
5.0000 mg | ORAL_TABLET | Freq: Two times a day (BID) | ORAL | 3 refills | Status: DC
Start: 2024-04-30 — End: 2024-04-30

## 2024-04-30 MED ORDER — APIXABAN 5 MG PO TABS: 5.0000 mg | ORAL_TABLET | Freq: Two times a day (BID) | ORAL | 1 refills | Status: AC

## 2024-04-30 MED ORDER — FLECAINIDE ACETATE 150 MG PO TABS: 75.0000 mg | ORAL_TABLET | Freq: Two times a day (BID) | ORAL | 0 refills | Status: AC

## 2024-04-30 NOTE — Telephone Encounter (Signed)
 Prescription refill request for Eliquis  received. Indication: AFIB Last office visit: 07/18/23 Scr: 0.85 Age:  73 Weight: 64.9 KG    Refill sent in for #180 X's 1 refill.

## 2024-05-02 DIAGNOSIS — I10 Essential (primary) hypertension: Secondary | ICD-10-CM | POA: Diagnosis not present

## 2024-05-02 DIAGNOSIS — I4891 Unspecified atrial fibrillation: Secondary | ICD-10-CM | POA: Diagnosis not present

## 2024-05-02 DIAGNOSIS — F1021 Alcohol dependence, in remission: Secondary | ICD-10-CM | POA: Diagnosis not present

## 2024-05-02 DIAGNOSIS — D6869 Other thrombophilia: Secondary | ICD-10-CM | POA: Diagnosis not present

## 2024-05-02 DIAGNOSIS — I700 Atherosclerosis of aorta: Secondary | ICD-10-CM | POA: Diagnosis not present

## 2024-05-02 DIAGNOSIS — E785 Hyperlipidemia, unspecified: Secondary | ICD-10-CM | POA: Diagnosis not present

## 2024-05-02 DIAGNOSIS — F325 Major depressive disorder, single episode, in full remission: Secondary | ICD-10-CM | POA: Diagnosis not present

## 2024-05-02 DIAGNOSIS — E039 Hypothyroidism, unspecified: Secondary | ICD-10-CM | POA: Diagnosis not present

## 2024-05-02 DIAGNOSIS — J439 Emphysema, unspecified: Secondary | ICD-10-CM | POA: Diagnosis not present

## 2024-05-06 ENCOUNTER — Encounter: Payer: Self-pay | Admitting: Family Medicine

## 2024-05-06 ENCOUNTER — Ambulatory Visit: Payer: Self-pay | Admitting: Family Medicine

## 2024-05-06 ENCOUNTER — Ambulatory Visit: Payer: Medicare PPO | Admitting: Family Medicine

## 2024-05-06 VITALS — BP 108/62 | HR 50 | Temp 97.3°F | Ht 67.0 in | Wt 141.0 lb

## 2024-05-06 DIAGNOSIS — F1021 Alcohol dependence, in remission: Secondary | ICD-10-CM

## 2024-05-06 DIAGNOSIS — I4891 Unspecified atrial fibrillation: Secondary | ICD-10-CM

## 2024-05-06 DIAGNOSIS — E039 Hypothyroidism, unspecified: Secondary | ICD-10-CM | POA: Diagnosis not present

## 2024-05-06 DIAGNOSIS — Z0000 Encounter for general adult medical examination without abnormal findings: Secondary | ICD-10-CM | POA: Diagnosis not present

## 2024-05-06 DIAGNOSIS — M8588 Other specified disorders of bone density and structure, other site: Secondary | ICD-10-CM

## 2024-05-06 DIAGNOSIS — Z87891 Personal history of nicotine dependence: Secondary | ICD-10-CM | POA: Diagnosis not present

## 2024-05-06 DIAGNOSIS — I10 Essential (primary) hypertension: Secondary | ICD-10-CM | POA: Diagnosis not present

## 2024-05-06 DIAGNOSIS — J439 Emphysema, unspecified: Secondary | ICD-10-CM

## 2024-05-06 LAB — CBC WITH DIFFERENTIAL/PLATELET
Basophils Absolute: 0 K/uL (ref 0.0–0.1)
Basophils Relative: 0.4 % (ref 0.0–3.0)
Eosinophils Absolute: 0.2 K/uL (ref 0.0–0.7)
Eosinophils Relative: 2.4 % (ref 0.0–5.0)
HCT: 36.3 % (ref 36.0–46.0)
Hemoglobin: 12.5 g/dL (ref 12.0–15.0)
Lymphocytes Relative: 24.7 % (ref 12.0–46.0)
Lymphs Abs: 2.1 K/uL (ref 0.7–4.0)
MCHC: 34.4 g/dL (ref 30.0–36.0)
MCV: 89.8 fl (ref 78.0–100.0)
Monocytes Absolute: 0.8 K/uL (ref 0.1–1.0)
Monocytes Relative: 9.1 % (ref 3.0–12.0)
Neutro Abs: 5.4 K/uL (ref 1.4–7.7)
Neutrophils Relative %: 63.4 % (ref 43.0–77.0)
Platelets: 271 K/uL (ref 150.0–400.0)
RBC: 4.04 Mil/uL (ref 3.87–5.11)
RDW: 13.5 % (ref 11.5–15.5)
WBC: 8.6 K/uL (ref 4.0–10.5)

## 2024-05-06 LAB — URINALYSIS, ROUTINE W REFLEX MICROSCOPIC
Bilirubin Urine: NEGATIVE
Hgb urine dipstick: NEGATIVE
Ketones, ur: NEGATIVE
Nitrite: NEGATIVE
Specific Gravity, Urine: 1.015 (ref 1.000–1.030)
Total Protein, Urine: NEGATIVE
Urine Glucose: NEGATIVE
Urobilinogen, UA: 0.2 (ref 0.0–1.0)
pH: 6.5 (ref 5.0–8.0)

## 2024-05-06 LAB — COMPREHENSIVE METABOLIC PANEL WITH GFR
ALT: 15 U/L (ref 0–35)
AST: 17 U/L (ref 0–37)
Albumin: 4 g/dL (ref 3.5–5.2)
Alkaline Phosphatase: 81 U/L (ref 39–117)
BUN: 16 mg/dL (ref 6–23)
CO2: 27 meq/L (ref 19–32)
Calcium: 9 mg/dL (ref 8.4–10.5)
Chloride: 104 meq/L (ref 96–112)
Creatinine, Ser: 0.84 mg/dL (ref 0.40–1.20)
GFR: 68.35 mL/min (ref >60.00)
Glucose, Bld: 81 mg/dL (ref 70–99)
Potassium: 4.4 meq/L (ref 3.5–5.1)
Sodium: 137 meq/L (ref 135–145)
Total Bilirubin: 0.6 mg/dL (ref 0.2–1.2)
Total Protein: 6.8 g/dL (ref 6.0–8.3)

## 2024-05-06 LAB — MICROALBUMIN / CREATININE URINE RATIO
Creatinine,U: 90 mg/dL
Microalb Creat Ratio: 16.4 mg/g (ref 0.0–30.0)
Microalb, Ur: 1.5 mg/dL (ref 0.0–1.9)

## 2024-05-06 LAB — LIPID PANEL
Cholesterol: 137 mg/dL (ref 0–200)
HDL: 61.4 mg/dL (ref >39.00)
LDL Cholesterol: 61 mg/dL (ref 0–99)
NonHDL: 75.38
Total CHOL/HDL Ratio: 2
Triglycerides: 73 mg/dL (ref 0.0–149.0)
VLDL: 14.6 mg/dL (ref 0.0–40.0)

## 2024-05-06 LAB — TSH: TSH: 3.96 u[IU]/mL (ref 0.35–5.50)

## 2024-05-06 NOTE — Progress Notes (Signed)
 I connected with  Margaret Charles on 05/06/24 by a audio enabled telemedicine application and verified that I am speaking with the correct person using two identifiers.  Patient Location: Home  Provider Location: Home Office  Persons Participating in Visit: Patient.  I discussed the limitations of evaluation and management by telemedicine. The patient expressed understanding and agreed to proceed.  Vital Signs: Because this visit was a virtual/telehealth visit, some criteria may be missing or patient reported. Any vitals not documented were not able to be obtained and vitals that have been documented are patient reported.

## 2024-05-06 NOTE — Patient Instructions (Addendum)
 Please stop by lab before you go If you have mychart- we will send your results within 3 business days of us  receiving them.  If you do not have mychart- we will call you about results within 5 business days of us  receiving them.  *please also note that you will see labs on mychart as soon as they post. I will later go in and write notes on them- will say notes from Dr. Katrinka   Schedule your bone density test at check out desk.  - located [address] across the street from [place] - in the basement - you DO NEED an appointment for the bone density tests.   Recommended follow up: Return in about 1 year (around 05/06/2025) for physical or sooner if needed.Schedule b4 you leave.

## 2024-05-06 NOTE — Progress Notes (Signed)
 Phone [phone]   Subjective:  Patient presents today for their annual physical. Chief complaint-noted.   See problem oriented charting- ROS- full  review of systems was completed and negative except for topics noted under acute/chronic concerns   The following were reviewed and entered/updated in epic: Past Medical History:  Diagnosis Date   A-fib (HCC)    post op lung transfer, never have been on medication   Allergy    SEASONAL   Arthritis    ddd   Cataract    BILATERAL,REMOVED   Chronic headaches    imprved recently per patient    Emphysema of lung (HCC)    asymptomatic. noted on CT   Family history of colon cancer    GERD (gastroesophageal reflux disease)    History of adenomatous polyp of colon    2015 precancerous polyp history- due spring 2020.  5 years   History of seizures    1980s few ??? seizure activity  (vagal response)LAST ONE IN THE 1980'S NONE SINCE UPDATED 04/18/22   Hyperlipidemia    Hypertension    Lung cancer (HCC) dx'd 07/2017   Neuromuscular disorder (HCC)    NERVE DAMAGE   Palpitations    RBBB (right bundle branch block)    palpitations   Seizures (HCC)    Skin cancer    skin basal cell cancer   Substance abuse (HCC)    ETOH,ABUSE SOBER FOR 40 YEARS UPDATED 04/18/22   Thyroid  disease    HYPO   Patient Active Problem List   Diagnosis Date Noted   Ascending aortic aneurysm 08/27/2018    Priority: High   Atrial fibrillation (HCC) 12/29/2017    Priority: High   Alcoholism in remission (HCC) 12/29/2017    Priority: High   Emphysema of lung (HCC)     Priority: High   S/P lobectomy of lung 09/16/2017    Priority: High   History of lung cancer- right lower lobe s/p right lobectomy 08/06/2017    Priority: High   Osteopenia of lumbar spine 05/06/2024    Priority: Medium    History of adenomatous polyp of colon 05/11/2019    Priority: Medium    Hypothyroidism 04/30/2019    Priority: Medium    Hypertension 12/29/2017    Priority:  Medium    History of skin cancer 12/29/2017    Priority: Medium    Chronic headaches     Priority: Medium    Left thyroid  nodule 10/07/2017    Priority: Medium    Palpitations 03/25/2017    Priority: Medium    Benign paroxysmal positional vertigo of right ear 05/25/2018    Priority: Low   Lens replaced by other means 08/12/2017    Priority: Low   Tear film insufficiency 08/12/2017    Priority: Low   Vitreous degeneration 08/12/2017    Priority: Low   Cervicogenic headache 08/11/2017    Priority: Low   DDD (degenerative disc disease), cervical 08/11/2017    Priority: Low   Arthritis of facet joint of cervical spine 07/28/2017    Priority: Low   RBBB 07/11/2015    Priority: Low   Senile nuclear sclerosis 12/25/2011    Priority: Low   Myopia 12/10/2011    Priority: Low   Aortic atherosclerosis 04/02/2021    Priority: 2.   Past Surgical History:  Procedure Laterality Date   BLADDER SURGERY  1985   Long term incontinence. [facility]- used small intestine to build bladder   BREAST BIOPSY  2001   CATARACT EXTRACTION  W/ INTRAOCULAR LENS  IMPLANT, BILATERAL     EYE SURGERY  Cataract surgery   Intestinal blockage  1989 and 1990   bowel obstruction surgery   Lung cancer removal   08/2017   Right lower lobe   SMALL INTESTINE SURGERY  1985, 1989, 1990   VIDEO ASSISTED THORACOSCOPY (VATS)/WEDGE RESECTION Right 09/16/2017   Procedure: VIDEO ASSISTED THORACOSCOPY (VATS)/LUNG RESECTION;  Surgeon: Army Dallas NOVAK, MD;  Location: [facility] OR;  Service: Thoracic;  Laterality: Right;   VIDEO BRONCHOSCOPY N/A 09/16/2017   Procedure: VIDEO BRONCHOSCOPY;  Surgeon: Army Dallas NOVAK, MD;  Location: [facility] OR;  Service: Thoracic;  Laterality: N/A;   WRIST SURGERY     de quervains tenosynovitis. Dr. Alusio actually did this.     Family History  Problem Relation Age of Onset   Dementia Mother    Heart attack Mother        age 57. after hip fracture.    Hypertension Mother    Aortic aneurysm Mother     Prostate cancer Father    Hypertension Father    Colon polyps Father    Diabetes Father    Breast cancer Sister    Hypertension Sister    Heart attack Sister        69. former smoker   Asthma Sister    Cancer Sister    Hypertension Sister    Atrial fibrillation Sister    Aortic aneurysm Sister        died at 51 despite s/p open heart repair   Heart failure Sister    COPD Sister    Heart disease Sister    Heart attack Paternal Grandfather    Esophageal cancer Paternal Aunt    Colon cancer Other        uncle, cousin   Colon polyps Other        family history   Crohn's disease Neg Hx    Rectal cancer Neg Hx    Stomach cancer Neg Hx    Ulcerative colitis Neg Hx     Medications- reviewed and updated Current Outpatient Medications  Medication Sig Dispense Refill   acetaminophen  (TYLENOL ) 325 MG tablet Take 650 mg by mouth every 6 (six) hours as needed for mild pain.     apixaban  (ELIQUIS ) 5 MG TABS tablet Take 1 tablet (5 mg total) by mouth 2 (two) times daily. 180 tablet 1   flecainide  (TAMBOCOR ) 150 MG tablet Take 0.5 tablets (75 mg total) by mouth 2 (two) times daily. 90 tablet 0   fluticasone (FLONASE) 50 MCG/ACT nasal spray Place 1 spray into both nostrils daily.     levothyroxine  (SYNTHROID ) 75 MCG tablet TAKE 1 TABLET BY MOUTH EVERY DAY 90 tablet 0   metoprolol  tartrate (LOPRESSOR ) 25 MG tablet TAKE 1 TABLET BY MOUTH TWICE A DAY 180 tablet 2   Multiple Vitamins-Minerals (CENTRUM ADULTS PO) Take 1 tablet by mouth daily.     Propylene Glycol-Glycerin (SOOTHE OP) Place 1 drop into both eyes 2 (two) times daily as needed (dry eyes).     psyllium (REGULOID) 0.52 g capsule Take 0.52 g by mouth daily.     rosuvastatin  (CRESTOR ) 5 MG tablet TAKE 1 TABLET BY MOUTH EVERY MONDAY, WEDNESDAY, AND FRIDAY 36 tablet 4   Current Facility-Administered Medications  Medication Dose Route Frequency Provider Last Rate Last Admin   0.9 %  sodium chloride  infusion  500 mL Intravenous  Continuous Armbruster, Elspeth SQUIBB, MD        Allergies-reviewed and updated  Allergies  Allergen Reactions   Codeine Nausea And Vomiting   Quinolones     Patient was warned about not using Cipro and similar antibiotics. Recent studies have raised concern that fluoroquinolone antibiotics could be associated with an increased risk of aortic aneurysm Fluoroquinolones have non-antimicrobial properties that might jeopardise the integrity of the extracellular matrix of the vascular wall In a  propensity score matched cohort study in [place], there was a 66% increased rate of aortic aneurysm or dissection associated with oral fluoroquinolone use, compared wit    Social History   Social History Narrative   Single. Lives alone.    Lived in [place] for some years to help with parents but otherwise in [place] most of life.       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 Oropharynx normal Neck: no thyromegaly CV: bradycardic but regular no murmurs rubs or gallops Lungs: CTAB no crackles, wheeze, rhonchi Abdomen: soft/nontender/nondistended/normal bowel sounds. No rebound or guarding.  Ext: no edema Skin: warm, dry Neuro: grossly normal, moves all extremities, PERRLA Breasts: normal appearance, no masses or tenderness    Assessment and Plan   73 y.o. female presenting for annual physical.  Health Maintenance counseling: 1. Anticipatory guidance: Patient counseled regarding regular dental exams -q6  months, eye exams - yearly,  avoiding smoking and second hand smoke , limiting alcohol to 1 beverage per day-well over 30 years without alcohol , no illicit drugs .   2. Risk factor reduction:  Advised patient of need for regular exercise and diet rich and fruits and vegetables to reduce risk of heart attack and stroke.  Exercise-last year she was limited by ankle issues- now exercising 3 miles walking at least 3 days a week.  Diet/weight management-very healthy- tries to eat reasonably healthy.  Wt Readings from Last 3 Encounters:  05/06/24 141 lb (64 kg)  04/28/24 143 lb (64.9 kg)  12/09/23 143 lb (64.9 kg)  3. Immunizations/screenings/ancillary studies-had COVID 2 weeks ago and otherwise up-to-date Immunization History  Administered Date(s) Administered   Fluad Quad(high Dose 65+) 03/19/2017, 03/26/2019, 04/02/2021   INFLUENZA, HIGH DOSE SEASONAL PF 04/24/2016, 03/20/2018, 03/28/2022, 04/27/2023   Influenza Split 05/09/2011, 04/26/2012, 03/24/2014, 04/22/2014   Influenza Whole 03/19/2017   Influenza, Seasonal, Injecte, Preservative Fre 04/11/2015   Influenza-Unspecified 03/31/2017, 03/24/2020, 04/15/2024   Moderna Covid-19 Fall Seasonal Vaccine 22yrs & older 12/22/2022   Moderna SARS-COV2 Booster Vaccination 03/23/2021   Moderna Sars-Covid-2 Vaccination 07/26/2019, 08/24/2019, 11/03/2020   PNEUMOCOCCAL CONJUGATE-20 05/05/2023   PPD Test 09/13/2014   Pfizer Covid-19 Vaccine Bivalent Booster 51yrs & up 03/23/2022   Pfizer(Comirnaty)Fall Seasonal Vaccine 12 years and older 05/20/2023   Pneumococcal Conjugate-13 06/15/2015   Pneumococcal Polysaccharide-23 03/19/2017   Respiratory Syncytial Virus Vaccine,Recomb Aduvanted(Arexvy) 06/09/2022   Tdap 09/15/2014   Zoster Recombinant(Shingrix) 03/13/2018, 05/12/2019, 10/26/2019   Zoster, Live 06/11/2013  4. Cervical cancer screening- no history of abnormal Pap smear.  Past age  based screening recommendations   5. Breast cancer screening-  breast exam-  normal-  and mammogram 3/12525 - with sisters history will continue annually 6. Colon cancer screening - Colonoscopy within our system May 03 2022 with plan for 5-year repeat due to history of polyps  7. Skin cancer screening- see skin surgery center yearly due to history of skin  cancer.  advised regular sunscreen use. Denies worrisome, changing, or new skin lesions.  8. Birth control/STD check-  postmenopausal.  Single/not sexually active  9. Osteoporosis screening at 70- osteopenia 2017 with worst T score -2.0 and lumbar spine.  repeat 04/10/20 with hip score 4.2% and then 04/2022 up to 7.1% risk but density roughly stable -  doing calciu/vitm D/weight bearing exercise-repeat DEXA  .  Hip fracture risk at 4.2% and offered medicine like Fosamax- but has been stable with lifestyle and held off in past  -Former smoker - quit smoking in 2010-history of lung cancer.  Get urinalysis today   Status of chronic or acute concerns   #social update- walking 3 days a week plus water aerobics- avg 1-2 days a week  #Chronic nonintractable headache-ongoing issues with neck tension can cause headaches.  Steroid injections in the past were not very helpful- see below for current workup   # Neck and upper back pain-August 2025- Seen by orthopedics-she was referred to physical therapy through early October-she reports ongoing issues and plans to see Dr. Burnetta- gets headache(s) coming from necks at times   #History of lung cancer-right lower lobe lobectomy March 2019 continues to follow with Dr. Kerrin and oncology Dr. Sherrod.  Patient was recently seen in June by both and with updated scan.  Repeat scan most recently June 2025 reassuring   # intercostal neuralgia -per pain management in past post surgery- Was referred to neurosurgery in 2023and saw Dr. Darlis but nothing particularly helped- injection, epidural, peripheral nerve stimulator. Later tried accupuncture and didn't work.  Ongoing flares with no great ansewr on treatment   # Atrial fibrillation- sees Dr. Alveta S: Rate controlled with metoprolol  25 mg twice daily , flecainide  75 mg twice daily  Anticoagulated with eliquis  5 mg twice daily . Discovered due to watch- no further episodes A/P: appropriately anticoagulated and rate controlled- continue current medicine    #Ascending aortic aneurysm 4.2 cm-plans for annual CT scan- 4.1 cm in 2023 and 4.0 in June 2024 and 4.1 cm in June 2025.   Follows with Dr. Kerrin and Nahser as well - stable as above  #Alcoholism in remission (HCC)-congratulated patient on remaining alcohol free   #hypertension/palpitations S: medication: Metoprolol  25Mg  BID.   BP Readings from Last 3 Encounters:  05/06/24 108/62  12/09/23 135/79  12/08/23 115/81  A/P: well controlled continue current medications   #aortic atherosclerosis/ coronary artery calcium  with treated hyperlipidemia  S: Medication: rosuvastatin  5 mg three days a week Lab Results  Component Value Date   CHOL 143 05/05/2023   HDL 65.40 05/05/2023   LDLCALC 64 05/05/2023   TRIG 69.0 05/05/2023   CHOLHDL 2 05/05/2023  A/P: at goal lats year on very low dose - update lipids today. Continue current medications with 3 days a week  #hypothyroidism-followed with Dr. Kassie previously S: compliant On thyroid  medication-Synthroid   -us  every 2 years vs stop - last 2023 Lab Results  Component Value Date   TSH 2.22 05/05/2023   A/P:hopefully stable- update TSH today. Continue current meds for now    #Pulmonary emphysema, unspecified emphysema type (HCC)-patient  largely asymptomatic.  Simply noted on imaging but asymptomatic   #fatty liver- noted on CT chest 2025 . Healthy weight- discussed reducing processed foods Lab Results  Component Value Date   ALT 14 12/01/2023   AST 16 12/01/2023   ALKPHOS 105 12/01/2023   BILITOT 0.6 12/01/2023   #BENIGN PAROXYSMAL POSITIONAL VERTIGO (BPPV) - none  lately  Recommended follow up: Return in about 1 year (around 05/06/2025) for physical or sooner if needed.Schedule b4 you leave. Future Appointments  Date Time Provider Department Center  07/19/2024  3:40 PM Jeffrie Oneil BROCKS, MD [facility]  11/30/2024 10:00 AM [facility] None  12/07/2024 10:00 AM Sherrod Sherrod, MD [facility] None  05/11/2025  1:00 PM [facility] ANNUAL WELLNESS VISIT 1 [facility] [place]   Lab/Order associations: fasting   ICD-10-CM   1. Preventative health care  Z00.00     2. Osteopenia of lumbar spine  M85.88     3. Atrial fibrillation, unspecified type (HCC)  I48.91     4. Emphysema of lung (HCC)  J43.9     5. Alcoholism in remission (HCC)  F10.21     6. Primary hypertension  I10     7. Acquired hypothyroidism  E03.9     8. Former smoker  Z87.891       No orders of the defined types were placed in this encounter.   Return precautions advised.  Garnette Lukes, MD

## 2024-05-07 NOTE — Progress Notes (Signed)
 Patient aware of results and notes Dr. Katrinka- Last read by Rollo [facility] at 7:43PM on 05/06/2024.

## 2024-05-12 DIAGNOSIS — M542 Cervicalgia: Secondary | ICD-10-CM | POA: Diagnosis not present

## 2024-05-13 ENCOUNTER — Other Ambulatory Visit: Payer: Self-pay | Admitting: Family Medicine

## 2024-05-13 DIAGNOSIS — E039 Hypothyroidism, unspecified: Secondary | ICD-10-CM

## 2024-06-14 ENCOUNTER — Inpatient Hospital Stay: Admission: RE | Admit: 2024-06-14 | Source: Ambulatory Visit

## 2024-06-15 ENCOUNTER — Ambulatory Visit
Admission: RE | Admit: 2024-06-15 | Discharge: 2024-06-15 | Disposition: A | Discharge status: Home / Self Care | Source: Ambulatory Visit | Attending: Family Medicine | Admitting: Family Medicine

## 2024-06-15 DIAGNOSIS — M8588 Other specified disorders of bone density and structure, other site: Secondary | ICD-10-CM | POA: Diagnosis not present

## 2024-07-19 ENCOUNTER — Ambulatory Visit: Admitting: Cardiology

## 2024-07-20 ENCOUNTER — Other Ambulatory Visit: Payer: Self-pay

## 2024-07-20 DIAGNOSIS — I480 Paroxysmal atrial fibrillation: Secondary | ICD-10-CM

## 2024-07-20 DIAGNOSIS — E785 Hyperlipidemia, unspecified: Secondary | ICD-10-CM

## 2024-07-20 DIAGNOSIS — I10 Essential (primary) hypertension: Secondary | ICD-10-CM

## 2024-07-21 ENCOUNTER — Encounter: Payer: Self-pay | Admitting: Cardiology

## 2024-07-21 DIAGNOSIS — I10 Essential (primary) hypertension: Secondary | ICD-10-CM

## 2024-07-21 DIAGNOSIS — E785 Hyperlipidemia, unspecified: Secondary | ICD-10-CM

## 2024-07-21 DIAGNOSIS — I480 Paroxysmal atrial fibrillation: Secondary | ICD-10-CM

## 2024-07-21 MED ORDER — METOPROLOL TARTRATE 25 MG PO TABS: 25.0000 mg | ORAL_TABLET | Freq: Two times a day (BID) | ORAL | 0 refills | Status: AC

## 2024-07-29 ENCOUNTER — Other Ambulatory Visit: Payer: Self-pay | Admitting: Cardiology

## 2024-08-12 ENCOUNTER — Ambulatory Visit: Admitting: Cardiology

## 2024-11-30 ENCOUNTER — Other Ambulatory Visit

## 2024-12-07 ENCOUNTER — Ambulatory Visit: Admitting: Internal Medicine

## 2025-05-10 ENCOUNTER — Encounter: Admitting: Family Medicine

## 2025-05-11 ENCOUNTER — Ambulatory Visit
